# Patient Record
Sex: Female | Born: 1970
Health system: Southern US, Community
[De-identification: ages and names within clinical notes are randomized; demographics above are authoritative.]

## PROBLEM LIST (undated history)

## (undated) DIAGNOSIS — R112 Nausea with vomiting, unspecified: Secondary | ICD-10-CM

## (undated) DIAGNOSIS — I10 Essential (primary) hypertension: Secondary | ICD-10-CM

## (undated) DIAGNOSIS — G4733 Obstructive sleep apnea (adult) (pediatric): Secondary | ICD-10-CM

## (undated) DIAGNOSIS — D259 Leiomyoma of uterus, unspecified: Secondary | ICD-10-CM

## (undated) DIAGNOSIS — Z9889 Other specified postprocedural states: Secondary | ICD-10-CM

## (undated) DIAGNOSIS — R7303 Prediabetes: Secondary | ICD-10-CM

## (undated) DIAGNOSIS — T7840XA Allergy, unspecified, initial encounter: Secondary | ICD-10-CM

## (undated) DIAGNOSIS — G4730 Sleep apnea, unspecified: Secondary | ICD-10-CM

## (undated) DIAGNOSIS — M1990 Unspecified osteoarthritis, unspecified site: Secondary | ICD-10-CM

## (undated) DIAGNOSIS — K219 Gastro-esophageal reflux disease without esophagitis: Secondary | ICD-10-CM

## (undated) DIAGNOSIS — R739 Hyperglycemia, unspecified: Secondary | ICD-10-CM

## (undated) HISTORY — DX: Gastro-esophageal reflux disease without esophagitis: K21.9

## (undated) HISTORY — DX: Leiomyoma of uterus, unspecified: D25.9

## (undated) HISTORY — DX: Obstructive sleep apnea (adult) (pediatric): G47.33

## (undated) HISTORY — DX: Hyperglycemia, unspecified: R73.9

## (undated) HISTORY — PX: BUNIONECTOMY: SHX129

## (undated) HISTORY — DX: Allergy, unspecified, initial encounter: T78.40XA

## (undated) HISTORY — PX: CHOLECYSTECTOMY: SHX55

## (undated) HISTORY — PX: CYST REMOVAL HAND: SHX6279

## (undated) HISTORY — DX: Sleep apnea, unspecified: G47.30

---
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
## 2011-05-05 ENCOUNTER — Emergency Department (HOSPITAL_BASED_OUTPATIENT_CLINIC_OR_DEPARTMENT_OTHER)
Admission: EM | Admit: 2011-05-05 | Discharge: 2011-05-05 | Disposition: A | Discharge status: Home / Self Care | Payer: 59

## 2011-05-05 NOTE — ED Notes (Signed)
Left prior to triage 

## 2012-11-03 ENCOUNTER — Emergency Department (HOSPITAL_BASED_OUTPATIENT_CLINIC_OR_DEPARTMENT_OTHER): Payer: 59

## 2012-11-03 ENCOUNTER — Encounter (HOSPITAL_BASED_OUTPATIENT_CLINIC_OR_DEPARTMENT_OTHER): Payer: Self-pay | Admitting: *Deleted

## 2012-11-03 ENCOUNTER — Emergency Department (HOSPITAL_BASED_OUTPATIENT_CLINIC_OR_DEPARTMENT_OTHER)
Admission: EM | Admit: 2012-11-03 | Discharge: 2012-11-03 | Disposition: A | Discharge status: Home / Self Care | Payer: 59 | Attending: Emergency Medicine | Admitting: Emergency Medicine

## 2012-11-03 DIAGNOSIS — M76899 Other specified enthesopathies of unspecified lower limb, excluding foot: Secondary | ICD-10-CM | POA: Insufficient documentation

## 2012-11-03 DIAGNOSIS — M719 Bursopathy, unspecified: Secondary | ICD-10-CM

## 2012-11-03 MED ORDER — HYDROCODONE-ACETAMINOPHEN 5-325 MG PO TABS: 2.0000 | ORAL_TABLET | ORAL | Status: DC | PRN

## 2012-11-03 MED ORDER — PREDNISONE 10 MG PO TABS: ORAL_TABLET | ORAL | Status: DC

## 2012-11-03 NOTE — ED Notes (Signed)
Pt returned from xray

## 2012-11-03 NOTE — ED Notes (Signed)
Pt c/o right hip pain w/o injury x 2 weeks

## 2012-11-03 NOTE — ED Provider Notes (Signed)
Medical screening examination/treatment/procedure(s) were performed by non-physician practitioner and as supervising physician I was immediately available for consultation/collaboration.  Gerhard Munch, MD 11/03/12 [phone]

## 2012-11-03 NOTE — ED Provider Notes (Signed)
History     CSN: [id number]  Arrival date & time 11/03/12  1931   First MD Initiated Contact with Patient 11/03/12 2133      Chief Complaint  Patient presents with  . Hip Pain    (Consider location/radiation/quality/duration/timing/severity/associated sxs/prior treatment) HPI Comments: Pt is c/o right hip pain times 2 weeks:pt states that she knows of no definite injury but she is very active:pt states that it hurts to move on lay on her right hip:no fever:no numbness or weakness:pt denies swelling, redness or warmth  The history is provided by the patient. No language interpreter was used.    History reviewed. No pertinent past medical history.  Past Surgical History  Procedure Date  . Cholecystectomy     History reviewed. No pertinent family history.  History  Substance Use Topics  . Smoking status: Never Smoker   . Smokeless tobacco: Not on file  . Alcohol Use: No    OB History    Grav Para Term Preterm Abortions TAB SAB Ect Mult Living                  Review of Systems  Constitutional: Negative.   Respiratory: Negative.   Cardiovascular: Negative.     Allergies  Review of patient's allergies indicates no known allergies.  Home Medications  No current outpatient prescriptions on file.  BP 150/93  Pulse 84  Temp 98.6 F (37 C) (Oral)  Resp 16  Ht 5\' 5"  (1.651 m)  Wt 200 lb (90.719 kg)  BMI 33.28 kg/m2  SpO2 100%  LMP 10/28/2012  Physical Exam  Nursing note and vitals reviewed. Constitutional: She is oriented to person, place, and time. She appears well-developed and well-nourished.  Cardiovascular: Normal rate and regular rhythm.   Pulmonary/Chest: Effort normal and breath sounds normal.  Musculoskeletal: Normal range of motion.       Pt tender on the right lateral hip:pt has full rom  Neurological: She is alert and oriented to person, place, and time.  Skin: Skin is warm and dry.    ED Course  Procedures (including critical care  time)  Labs Reviewed - No data to display Dg Hip Complete Right  11/03/2012  *RADIOLOGY REPORT*  Clinical Data: Right hip pain for 1 month.  RIGHT HIP - COMPLETE 2+ VIEW  Comparison: None.  Findings: Imaged bones, joints and soft tissues appear normal.  IMPRESSION: Negative exam.   Original Report Authenticated By: Holley Dexter, M.D.      1. Bursitis       MDM  Exam consistent with bursitis:will treat symptomatically and refer to Dr Pearletha Forge for follow up        Teressa Lower, NP 11/03/12 2214

## 2012-11-03 NOTE — ED Notes (Signed)
Patient transported to X-ray 

## 2012-11-04 ENCOUNTER — Ambulatory Visit (INDEPENDENT_AMBULATORY_CARE_PROVIDER_SITE_OTHER): Payer: 59 | Admitting: Family Medicine

## 2012-11-04 ENCOUNTER — Encounter: Payer: Self-pay | Admitting: Family Medicine

## 2012-11-04 VITALS — BP 125/86 | HR 81 | Ht 65.0 in | Wt 205.0 lb

## 2012-11-04 DIAGNOSIS — M25551 Pain in right hip: Secondary | ICD-10-CM

## 2012-11-04 DIAGNOSIS — M25559 Pain in unspecified hip: Secondary | ICD-10-CM

## 2012-11-04 NOTE — Patient Instructions (Addendum)
Your primary issue is IT band syndrome with associated trochanteric bursitis, hip flexor spasms. Avoid painful activities as much as possible. Ice over area of pain 3-4 times a day for 15 minutes at a time Start physical therapy for stretches and strengthening exercises. Do home exercises and stretches on days you do not go to physical therapy. Finish the prednisone. After you finish prednisone start aleve 2 tabs twice a day with food for pain and inflammation. If not improving over the next 1-2 weeks and you want to go ahead with an injection just call us and we will do that. Otherwise follow up with me in 6 weeks.

## 2012-11-05 ENCOUNTER — Encounter: Payer: Self-pay | Admitting: Family Medicine

## 2012-11-05 DIAGNOSIS — M25551 Pain in right hip: Secondary | ICD-10-CM | POA: Insufficient documentation

## 2012-11-05 NOTE — Progress Notes (Signed)
  Subjective:    Patient ID: Denise Austin, female    DOB: 07/26/1971, 42 y.o.   MRN: [id number]  PCP: [facility]  HPI 42 yo F here for right hip pain.  Patient denies known injury and does not exercise regularly. States over past couple months has developed pain mostly lateral right hip radiating down to knee. Worse when on feet for a long time or if sitting for a long time then goes to get up. Right hip radiographs were normal. Takes ibuprofen, sometimes aleve instead which has eased pain but really returned on Sunday. No back pain. Once or twice had numbness in this area. No prior issues with this hip.  History reviewed. No pertinent past medical history.  Current Outpatient Prescriptions on File Prior to Visit  Medication Sig Dispense Refill  . HYDROcodone-acetaminophen (NORCO/VICODIN) 5-325 MG per tablet Take 2 tablets by mouth every 4 (four) hours as needed for pain.  10 tablet  0  . predniSONE (DELTASONE) 10 MG tablet 6 day step down  21 tablet  0    Past Surgical History  Procedure Date  . Cholecystectomy   . Bunionectomy     No Known Allergies  History   Social History  . Marital Status: Single    Spouse Name: N/A    Number of Children: N/A  . Years of Education: N/A   Occupational History  . Not on file.   Social History Main Topics  . Smoking status: Never Smoker   . Smokeless tobacco: Not on file  . Alcohol Use: No  . Drug Use: No  . Sexually Active: No   Other Topics Concern  . Not on file   Social History Narrative  . No narrative on file    Family History  Problem Relation Age of Onset  . Hypertension Mother   . Diabetes Mother   . Hypertension Father   . Heart attack Neg Hx   . Hyperlipidemia Neg Hx   . Sudden death Neg Hx     BP 125/86  Pulse 81  Ht 5\' 5"  (1.651 m)  Wt 205 lb (92.987 kg)  BMI 34.11 kg/m2  LMP 10/28/2012 Review of Systems See HPI above.    Objective:   Physical Exam Gen:  NAD  Back/R hip: No gross deformity, scoliosis. TTP proximal 1/2 ITB including trochanteric bursa.  Less TTP anterior thigh proximally.  No midline or bony TTP.  No back TTP. FROM without pain. Strength LEs 5/5 all muscle groups except 4+/5 hip abduction.   2+ MSRs in patellar and achilles tendons, equal bilaterally. Negative SLRs. Sensation intact to light touch bilaterally. Negative logroll bilateral hips Negative fabers and piriformis stretches.    Assessment & Plan:  1. Right hip pain - Consistent with IT band syndrome, bursitis, with hip flexor spasms.  No back pain, radiculopathic symptoms to suggest pinched nerve.  Start with PT, home exercises, continue prednisone.  Declined trochanteric bursa injection today (she has more pain than just in this area).  Icing, avoid painful activities.  F/u in 6 weeks for reevaluation.

## 2012-11-05 NOTE — Assessment & Plan Note (Signed)
Consistent with IT band syndrome, bursitis, with hip flexor spasms.  No back pain, radiculopathic symptoms to suggest pinched nerve.  Start with PT, home exercises, continue prednisone.  Declined trochanteric bursa injection today (she has more pain than just in this area).  Icing, avoid painful activities.  F/u in 6 weeks for reevaluation.

## 2012-11-08 ENCOUNTER — Encounter: Payer: 59 | Admitting: Physical Therapy

## 2012-11-08 ENCOUNTER — Ambulatory Visit: Payer: 59 | Admitting: Physical Therapy

## 2012-11-25 ENCOUNTER — Encounter (HOSPITAL_BASED_OUTPATIENT_CLINIC_OR_DEPARTMENT_OTHER): Payer: Self-pay

## 2012-11-25 ENCOUNTER — Emergency Department (HOSPITAL_BASED_OUTPATIENT_CLINIC_OR_DEPARTMENT_OTHER)
Admission: EM | Admit: 2012-11-25 | Discharge: 2012-11-25 | Disposition: A | Discharge status: Home / Self Care | Payer: 59 | Attending: Emergency Medicine | Admitting: Emergency Medicine

## 2012-11-25 DIAGNOSIS — L0291 Cutaneous abscess, unspecified: Secondary | ICD-10-CM

## 2012-11-25 DIAGNOSIS — Y939 Activity, unspecified: Secondary | ICD-10-CM | POA: Insufficient documentation

## 2012-11-25 DIAGNOSIS — X58XXXA Exposure to other specified factors, initial encounter: Secondary | ICD-10-CM | POA: Insufficient documentation

## 2012-11-25 DIAGNOSIS — L02519 Cutaneous abscess of unspecified hand: Secondary | ICD-10-CM | POA: Insufficient documentation

## 2012-11-25 DIAGNOSIS — L03019 Cellulitis of unspecified finger: Secondary | ICD-10-CM | POA: Insufficient documentation

## 2012-11-25 DIAGNOSIS — Y929 Unspecified place or not applicable: Secondary | ICD-10-CM | POA: Insufficient documentation

## 2012-11-25 DIAGNOSIS — L089 Local infection of the skin and subcutaneous tissue, unspecified: Secondary | ICD-10-CM | POA: Insufficient documentation

## 2012-11-25 DIAGNOSIS — T148XXA Other injury of unspecified body region, initial encounter: Secondary | ICD-10-CM

## 2012-11-25 MED ORDER — LIDOCAINE HCL (PF) 1 % IJ SOLN
5.0000 mL | Freq: Once | INTRAMUSCULAR | Status: AC
  Administered 2012-11-25: 5 mL
  Filled 2012-11-25: qty 5

## 2012-11-25 NOTE — ED Notes (Signed)
Pt reports 3rd digit on right hand has been painful and swollen x 2 weeks.

## 2012-11-25 NOTE — ED Provider Notes (Signed)
History     CSN: [id number]  Arrival date & time 11/25/12  1701   First MD Initiated Contact with Patient 11/25/12 1716      Chief Complaint  Patient presents with  . Hand Pain    (Consider location/radiation/quality/duration/timing/severity/associated sxs/prior treatment) HPI Comments: Patient comes to the ER for evaluation of pain and swelling of the right middle finger. Patient reports that she get a splinter in the area just before the pain and swelling started. She thought she pulled a splinter entirely out. Over the next 2 weeks, however, she has been noticing increased pain in the area. The area is swollen and tender to the touch. Pain has become moderate to severe. She has not had any drainage.  Patient is a 42 y.o. female presenting with hand pain.  Hand Pain    History reviewed. No pertinent past medical history.  Past Surgical History  Procedure Date  . Cholecystectomy   . Bunionectomy     Family History  Problem Relation Age of Onset  . Hypertension Mother   . Diabetes Mother   . Hypertension Father   . Heart attack Neg Hx   . Hyperlipidemia Neg Hx   . Sudden death Neg Hx     History  Substance Use Topics  . Smoking status: Never Smoker   . Smokeless tobacco: Not on file  . Alcohol Use: No    OB History    Grav Para Term Preterm Abortions TAB SAB Ect Mult Living                  Review of Systems  Skin: Positive for wound.    Allergies  Review of patient's allergies indicates no known allergies.  Home Medications  No current outpatient prescriptions on file.  BP 143/88  Pulse 71  Temp 98.2 F (36.8 C) (Oral)  Resp 18  Ht 5\' 5"  (1.651 m)  Wt 200 lb (90.719 kg)  BMI 33.28 kg/m2  SpO2 100%  LMP 10/28/2012  Physical Exam  Musculoskeletal: Normal range of motion. She exhibits no edema.       Right hand: She exhibits tenderness. She exhibits normal range of motion and no deformity. normal sensation noted. Normal strength noted.   Hands:      Small pustule formation over the middle phalanx of the right middle finger on the volar side. No induration or surrounding erythema. Area is tender to the touch.    ED Course  Procedures (including critical care time)  INCISION AND DRAINAGE Performed by: Gilda Crease. Consent: Verbal consent obtained. Risks and benefits: risks, benefits and alternatives were discussed Type: abscess  Body area: Right middle finger  Anesthesia: local infiltration  Incision was made with a scalpel.  Local anesthetic: lidocaine 1% without epinephrine  Anesthetic total: 1 ml  Complexity: complex Blunt dissection to break up loculations  Drainage: purulent  Drainage amount: small  4 mm wooden splinter removed from the abscess cavity   Packing material: 1/4 in iodoform gauze  Patient tolerance: Patient tolerated the procedure well with no immediate complications.     Labs Reviewed - No data to display No results found.   Diagnosis: 1. Skin Abscess 2. Splinter    MDM  Patient is a small area of tenderness and supine on the volar aspect of the right middle finger where she had a splinter. She pulled the splinter out herself, thought that she got total splinter, but has had increasing pain and swelling since. I recommended digital block  with incision and drainage of the area to the patient. Patient did give verbal consent.  Incision and drainage revealed a small amount of pus, but there was a large piece of splinter still retained in the cavity. This was removed in entirety. There is no erythema or induration. This appeared to be a sterile abscess secondary to retained foreign body. Patient will use Neosporin and topical wound care.      Gilda Crease, MD 11/25/12 [phone]

## 2014-07-06 DIAGNOSIS — M659 Synovitis and tenosynovitis, unspecified: Secondary | ICD-10-CM | POA: Insufficient documentation

## 2014-07-06 DIAGNOSIS — M19049 Primary osteoarthritis, unspecified hand: Secondary | ICD-10-CM | POA: Insufficient documentation

## 2014-07-06 DIAGNOSIS — Z9889 Other specified postprocedural states: Secondary | ICD-10-CM | POA: Insufficient documentation

## 2014-08-30 ENCOUNTER — Emergency Department (HOSPITAL_BASED_OUTPATIENT_CLINIC_OR_DEPARTMENT_OTHER)
Admission: EM | Admit: 2014-08-30 | Discharge: 2014-08-30 | Discharge status: Against Medical Advice | Payer: 59 | Attending: Emergency Medicine | Admitting: Emergency Medicine

## 2014-08-30 ENCOUNTER — Encounter (HOSPITAL_BASED_OUTPATIENT_CLINIC_OR_DEPARTMENT_OTHER): Payer: Self-pay | Admitting: Emergency Medicine

## 2014-08-30 DIAGNOSIS — Y929 Unspecified place or not applicable: Secondary | ICD-10-CM | POA: Insufficient documentation

## 2014-08-30 DIAGNOSIS — R51 Headache: Secondary | ICD-10-CM | POA: Diagnosis present

## 2014-08-30 DIAGNOSIS — T7840XA Allergy, unspecified, initial encounter: Secondary | ICD-10-CM | POA: Diagnosis not present

## 2014-08-30 DIAGNOSIS — Y939 Activity, unspecified: Secondary | ICD-10-CM | POA: Insufficient documentation

## 2014-08-30 DIAGNOSIS — X58XXXA Exposure to other specified factors, initial encounter: Secondary | ICD-10-CM | POA: Diagnosis not present

## 2014-08-30 NOTE — ED Provider Notes (Signed)
Patient left without being seen.   Wandra Arthurs, MD 08/30/14 [phone]

## 2014-08-30 NOTE — ED Notes (Signed)
Pt not in ED WR when called for tx area 

## 2014-08-30 NOTE — ED Notes (Signed)
Took a 43 yo with and allergic reaction that had recently come in and she walked out

## 2014-08-30 NOTE — ED Notes (Signed)
States has a "cold"  Aches  And headache-

## 2014-08-30 NOTE — ED Notes (Signed)
Trying to get triage info.  Pt hostile and very sharpe. Hesitant about answering more questions.  States she has noticed others going back. Most going back for triage and coming back to lobby.

## 2014-08-31 ENCOUNTER — Encounter (HOSPITAL_BASED_OUTPATIENT_CLINIC_OR_DEPARTMENT_OTHER): Payer: Self-pay

## 2014-08-31 ENCOUNTER — Emergency Department (HOSPITAL_BASED_OUTPATIENT_CLINIC_OR_DEPARTMENT_OTHER)
Admission: EM | Admit: 2014-08-31 | Discharge: 2014-08-31 | Disposition: A | Discharge status: Home / Self Care | Payer: BC Managed Care – PPO | Attending: Emergency Medicine | Admitting: Emergency Medicine

## 2014-08-31 DIAGNOSIS — R0981 Nasal congestion: Secondary | ICD-10-CM | POA: Diagnosis present

## 2014-08-31 DIAGNOSIS — J069 Acute upper respiratory infection, unspecified: Secondary | ICD-10-CM

## 2014-08-31 LAB — RAPID STREP SCREEN (MED CTR MEBANE ONLY): Streptococcus, Group A Screen (Direct): NEGATIVE

## 2014-08-31 MED ORDER — LORATADINE 10 MG PO TABS: 10.0000 mg | ORAL_TABLET | Freq: Every day | ORAL | Status: DC

## 2014-08-31 MED ORDER — FLUTICASONE PROPIONATE 50 MCG/ACT NA SUSP: 2.0000 | Freq: Every day | NASAL | Status: DC

## 2014-08-31 MED ORDER — DEXAMETHASONE 4 MG PO TABS
10.0000 mg | ORAL_TABLET | Freq: Once | ORAL | Status: AC
  Administered 2014-08-31: 10 mg via ORAL

## 2014-08-31 MED ORDER — DEXAMETHASONE 4 MG PO TABS
ORAL_TABLET | ORAL | Status: AC
  Filled 2014-08-31: qty 3

## 2014-08-31 NOTE — ED Provider Notes (Signed)
CSN: [id number]     Arrival date & time 08/31/14  0745 History   None    Chief Complaint  Patient presents with  . Nasal Congestion     (Consider location/radiation/quality/duration/timing/severity/associated sxs/prior Treatment) Patient is a 43 y.o. female presenting with URI. The history is provided by the patient.  URI Presenting symptoms: congestion and sore throat   Presenting symptoms: no cough, no fatigue and no fever   Congestion:    Location:  Nasal Severity:  Mild Onset quality:  Gradual Duration:  3 days Timing:  Constant Progression:  Unchanged Chronicity:  New Relieved by:  Nothing Worsened by:  Nothing tried Ineffective treatments:  None tried Associated symptoms: headaches (sinus)   Associated symptoms: no neck pain     History reviewed. No pertinent past medical history. Past Surgical History  Procedure Laterality Date  . Cholecystectomy    . Bunionectomy     Family History  Problem Relation Age of Onset  . Hypertension Mother   . Diabetes Mother   . Hypertension Father   . Heart attack Neg Hx   . Hyperlipidemia Neg Hx   . Sudden death Neg Hx    History  Substance Use Topics  . Smoking status: Never Smoker   . Smokeless tobacco: Not on file  . Alcohol Use: No   OB History    No data available     Review of Systems  Constitutional: Negative for fever and fatigue.  HENT: Positive for congestion and sore throat. Negative for drooling.   Eyes: Negative for pain.  Respiratory: Negative for cough and shortness of breath.   Cardiovascular: Negative for chest pain.  Gastrointestinal: Negative for nausea, vomiting, abdominal pain and diarrhea.  Genitourinary: Negative for dysuria and hematuria.  Musculoskeletal: Negative for back pain, gait problem and neck pain.  Skin: Negative for color change.  Neurological: Positive for headaches (sinus). Negative for dizziness.  Hematological: Negative for adenopathy.  Psychiatric/Behavioral: Negative for  behavioral problems.  All other systems reviewed and are negative.     Allergies  Review of patient's allergies indicates no known allergies.  Home Medications   Prior to Admission medications   Not on File   BP 147/95 mmHg  Pulse 99  Temp(Src) 99.4 F (37.4 C) (Oral)  Resp 16  SpO2 99%  LMP 08/12/2014 Physical Exam  Constitutional: She is oriented to person, place, and time. She appears well-developed and well-nourished.  HENT:  Head: Normocephalic and atraumatic.  Mouth/Throat: Oropharynx is clear and moist. No oropharyngeal exudate.  Mildly prominent tender anterior cervical adenopathy.   Eyes: Conjunctivae and EOM are normal. Pupils are equal, round, and reactive to light.  Neck: Normal range of motion. Neck supple.  Cardiovascular: Normal rate, regular rhythm, normal heart sounds and intact distal pulses.  Exam reveals no gallop and no friction rub.   No murmur heard. Pulmonary/Chest: Effort normal and breath sounds normal. No respiratory distress. She has no wheezes.  Abdominal: Soft. Bowel sounds are normal. There is no tenderness. There is no rebound and no guarding.  Musculoskeletal: Normal range of motion. She exhibits no edema or tenderness.  Neurological: She is alert and oriented to person, place, and time.  Skin: Skin is warm and dry.  Psychiatric: She has a normal mood and affect. Her behavior is normal.  Nursing note and vitals reviewed.   ED Course  Procedures (including critical care time) Labs Review Labs Reviewed  RAPID STREP SCREEN  CULTURE, GROUP A STREP    Imaging Review  No results found.   EKG Interpretation None      MDM   Final diagnoses:  Viral URI    7:58 AM 43 y.o. female here w/ nasal cong, sore throat for 3-4 days. No fevers. AFVSS here. Will perform strep screen (Centor 2) and give a dose of decadron. Likely viral URI, will rec sx therapy.     Pamella Pert, MD 08/31/14 [phone]

## 2014-08-31 NOTE — Discharge Instructions (Signed)
Upper Respiratory Infection, Adult An upper respiratory infection (URI) is also sometimes known as the common cold. The upper respiratory tract includes the nose, sinuses, throat, trachea, and bronchi. Bronchi are the airways leading to the lungs. Most people improve within 1 week, but symptoms can last up to 2 weeks. A residual cough may last even longer.  CAUSES Many different viruses can infect the tissues lining the upper respiratory tract. The tissues become irritated and inflamed and often become very moist. Mucus production is also common. A cold is contagious. You can easily spread the virus to others by oral contact. This includes kissing, sharing a glass, coughing, or sneezing. Touching your mouth or nose and then touching a surface, which is then touched by another person, can also spread the virus. SYMPTOMS  Symptoms typically develop 1 to 3 days after you come in contact with a cold virus. Symptoms vary from person to person. They may include:  Runny nose.  Sneezing.  Nasal congestion.  Sinus irritation.  Sore throat.  Loss of voice (laryngitis).  Cough.  Fatigue.  Muscle aches.  Loss of appetite.  Headache.  Low-grade fever. DIAGNOSIS  You might diagnose your own cold based on familiar symptoms, since most people get a cold 2 to 3 times a year. Your caregiver can confirm this based on your exam. Most importantly, your caregiver can check that your symptoms are not due to another disease such as strep throat, sinusitis, pneumonia, asthma, or epiglottitis. Blood tests, throat tests, and X-rays are not necessary to diagnose a common cold, but they may sometimes be helpful in excluding other more serious diseases. Your caregiver will decide if any further tests are required. RISKS AND COMPLICATIONS  You may be at risk for a more severe case of the common cold if you smoke cigarettes, have chronic heart disease (such as heart failure) or lung disease (such as asthma), or if  you have a weakened immune system. The very young and very old are also at risk for more serious infections. Bacterial sinusitis, middle ear infections, and bacterial pneumonia can complicate the common cold. The common cold can worsen asthma and chronic obstructive pulmonary disease (COPD). Sometimes, these complications can require emergency medical care and may be life-threatening. PREVENTION  The best way to protect against getting a cold is to practice good hygiene. Avoid oral or hand contact with people with cold symptoms. Wash your hands often if contact occurs. There is no clear evidence that vitamin C, vitamin E, echinacea, or exercise reduces the chance of developing a cold. However, it is always recommended to get plenty of rest and practice good nutrition. TREATMENT  Treatment is directed at relieving symptoms. There is no cure. Antibiotics are not effective, because the infection is caused by a virus, not by bacteria. Treatment may include:  Increased fluid intake. Sports drinks offer valuable electrolytes, sugars, and fluids.  Breathing heated mist or steam (vaporizer or shower).  Eating chicken soup or other clear broths, and maintaining good nutrition.  Getting plenty of rest.  Using gargles or lozenges for comfort.  Controlling fevers with ibuprofen or acetaminophen as directed by your caregiver.  Increasing usage of your inhaler if you have asthma. Zinc gel and zinc lozenges, taken in the first 24 hours of the common cold, can shorten the duration and lessen the severity of symptoms. Pain medicines may help with fever, muscle aches, and throat pain. A variety of non-prescription medicines are available to treat congestion and runny nose. Your caregiver   can make recommendations and may suggest nasal or lung inhalers for other symptoms.  HOME CARE INSTRUCTIONS   Only take over-the-counter or prescription medicines for pain, discomfort, or fever as directed by your  caregiver.  Use a warm mist humidifier or inhale steam from a shower to increase air moisture. This may keep secretions moist and make it easier to breathe.  Drink enough water and fluids to keep your urine clear or pale yellow.  Rest as needed.  Return to work when your temperature has returned to normal or as your caregiver advises. You may need to stay home longer to avoid infecting others. You can also use a face mask and careful hand washing to prevent spread of the virus. SEEK MEDICAL CARE IF:   After the first few days, you feel you are getting worse rather than better.  You need your caregiver's advice about medicines to control symptoms.  You develop chills, worsening shortness of breath, or brown or red sputum. These may be signs of pneumonia.  You develop yellow or brown nasal discharge or pain in the face, especially when you bend forward. These may be signs of sinusitis.  You develop a fever, swollen neck glands, pain with swallowing, or white areas in the back of your throat. These may be signs of strep throat. SEEK IMMEDIATE MEDICAL CARE IF:   You have a fever.  You develop severe or persistent headache, ear pain, sinus pain, or chest pain.  You develop wheezing, a prolonged cough, cough up blood, or have a change in your usual mucus (if you have chronic lung disease).  You develop sore muscles or a stiff neck. Document Released: 04/08/2001 Document Revised: 01/05/2012 Document Reviewed: 01/18/2014 [employer] Patient Information 2015 [employer]. This information is not intended to replace advice given to you by your health care provider. Make sure you discuss any questions you have with your health care provider.  

## 2014-08-31 NOTE — ED Notes (Signed)
Reports nasal congestion and sorethroat since Monday.

## 2014-09-02 LAB — CULTURE, GROUP A STREP

## 2014-10-16 ENCOUNTER — Encounter: Payer: Self-pay | Admitting: Family

## 2014-10-16 ENCOUNTER — Ambulatory Visit (INDEPENDENT_AMBULATORY_CARE_PROVIDER_SITE_OTHER): Payer: BC Managed Care – PPO | Admitting: Family

## 2014-10-16 VITALS — BP 118/82 | HR 76 | Temp 97.9°F | Resp 16 | Ht 66.0 in | Wt 210.8 lb

## 2014-10-16 DIAGNOSIS — Z0000 Encounter for general adult medical examination without abnormal findings: Secondary | ICD-10-CM

## 2014-10-16 LAB — LIPID PANEL
Cholesterol: 104 mg/dL (ref 0–200)
HDL: 34.9 mg/dL — ABNORMAL LOW (ref >39.00)
LDL Cholesterol: 60 mg/dL (ref 0–99)
NonHDL: 69.1
Total CHOL/HDL Ratio: 3
Triglycerides: 47 mg/dL (ref 0.0–149.0)
VLDL: 9.4 mg/dL (ref 0.0–40.0)

## 2014-10-16 LAB — CBC WITH DIFFERENTIAL/PLATELET
Basophils Absolute: 0 10*3/uL (ref 0.0–0.1)
Basophils Relative: 0.6 % (ref 0.0–3.0)
Eosinophils Absolute: 0.1 10*3/uL (ref 0.0–0.7)
Eosinophils Relative: 1.7 % (ref 0.0–5.0)
HCT: 38 % (ref 36.0–46.0)
Hemoglobin: 12.4 g/dL (ref 12.0–15.0)
Lymphocytes Relative: 28.3 % (ref 12.0–46.0)
Lymphs Abs: 1.8 10*3/uL (ref 0.7–4.0)
MCHC: 32.7 g/dL (ref 30.0–36.0)
MCV: 92.9 fl (ref 78.0–100.0)
Monocytes Absolute: 0.4 10*3/uL (ref 0.1–1.0)
Monocytes Relative: 5.7 % (ref 3.0–12.0)
Neutro Abs: 4 10*3/uL (ref 1.4–7.7)
Neutrophils Relative %: 63.7 % (ref 43.0–77.0)
Platelets: 284 10*3/uL (ref 150.0–400.0)
RBC: 4.09 Mil/uL (ref 3.87–5.11)
RDW: 13.8 % (ref 11.5–15.5)
WBC: 6.2 10*3/uL (ref 4.0–10.5)

## 2014-10-16 LAB — HEPATIC FUNCTION PANEL
ALT: 16 U/L (ref 0–35)
AST: 19 U/L (ref 0–37)
Albumin: 3.7 g/dL (ref 3.5–5.2)
Alkaline Phosphatase: 60 U/L (ref 39–117)
Bilirubin, Direct: 0.1 mg/dL (ref 0.0–0.3)
Total Bilirubin: 0.7 mg/dL (ref 0.2–1.2)
Total Protein: 6.8 g/dL (ref 6.0–8.3)

## 2014-10-16 LAB — BASIC METABOLIC PANEL
BUN: 13 mg/dL (ref 6–23)
CO2: 23 mEq/L (ref 19–32)
Calcium: 8.9 mg/dL (ref 8.4–10.5)
Chloride: 107 mEq/L (ref 96–112)
Creatinine, Ser: 0.8 mg/dL (ref 0.4–1.2)
GFR: 106.23 mL/min (ref >60.00)
Glucose, Bld: 113 mg/dL — ABNORMAL HIGH (ref 70–99)
Potassium: 4.1 mEq/L (ref 3.5–5.1)
Sodium: 137 mEq/L (ref 135–145)

## 2014-10-16 LAB — URINALYSIS, ROUTINE W REFLEX MICROSCOPIC
Bilirubin Urine: NEGATIVE
Ketones, ur: NEGATIVE
Leukocytes, UA: NEGATIVE
Nitrite: NEGATIVE
Specific Gravity, Urine: 1.025 (ref 1.000–1.030)
Total Protein, Urine: NEGATIVE
Urine Glucose: NEGATIVE
Urobilinogen, UA: 1 (ref 0.0–1.0)
pH: 6 (ref 5.0–8.0)

## 2014-10-16 LAB — HIV ANTIBODY (ROUTINE TESTING W REFLEX): HIV 1&2 Ab, 4th Generation: NONREACTIVE

## 2014-10-16 NOTE — Progress Notes (Signed)
Pre visit review using our clinic review tool, if applicable. No additional management support is needed unless otherwise documented below in the visit note. 

## 2014-10-16 NOTE — Patient Instructions (Signed)
Please complete lab work prior to leaving. Try counting calories to help you with weight loss using My Fitness pal. Follow up in 1 year, sooner if problems/concerns.  Food Choices for Gastroesophageal Reflux Disease When you have gastroesophageal reflux disease (GERD), the foods you eat and your eating habits are very important. Choosing the right foods can help ease the discomfort of GERD. WHAT GENERAL GUIDELINES DO I NEED TO FOLLOW?  Choose fruits, vegetables, whole grains, low-fat dairy products, and low-fat meat, fish, and poultry.  Limit fats such as oils, salad dressings, butter, nuts, and avocado.  Keep a food diary to identify foods that cause symptoms.  Avoid foods that cause reflux. These may be different for different people.  Eat frequent small meals instead of three large meals each day.  Eat your meals slowly, in a relaxed setting.  Limit fried foods.  Cook foods using methods other than frying.  Avoid drinking alcohol.  Avoid drinking large amounts of liquids with your meals.  Avoid bending over or lying down until 2-3 hours after eating. WHAT FOODS ARE NOT RECOMMENDED? The following are some foods and drinks that may worsen your symptoms: Vegetables Tomatoes. Tomato juice. Tomato and spaghetti sauce. Chili peppers. Onion and garlic. Horseradish. Fruits Oranges, grapefruit, and lemon (fruit and juice). Meats High-fat meats, fish, and poultry. This includes hot dogs, ribs, ham, sausage, salami, and bacon. Dairy Whole milk and chocolate milk. Sour cream. Cream. Butter. Ice cream. Cream cheese.  Beverages Coffee and tea, with or without caffeine. Carbonated beverages or energy drinks. Condiments Hot sauce. Barbecue sauce.  Sweets/Desserts Chocolate and cocoa. Donuts. Peppermint and spearmint. Fats and Oils High-fat foods, including [place] fries and potato chips. Other Vinegar. Strong spices, such as black pepper, white pepper, red pepper, cayenne, curry  powder, cloves, ginger, and chili powder. The items listed above may not be a complete list of foods and beverages to avoid. Contact your dietitian for more information. Document Released: 10/13/2005 Document Revised: 10/18/2013 Document Reviewed: 08/17/2013 [facility] Patient Information 2015 [place], [place]. This information is not intended to replace advice given to you by your health care provider. Make sure you discuss any questions you have with your health care provider.

## 2014-10-16 NOTE — Progress Notes (Signed)
Subjective:    Patient ID: Denise Austin, female    DOB: May 14, 1971, 43 y.o.   MRN: [id number]  HPI  Patient presents today for complete physical.  Immunizations: last tetanus, 09/23/14 Diet: reports diet is not healthy.  Needs to cut back on bread/soft drinks.  Reports that her weight was 180-190 18 mos ago. Exercise:walking in the afternoons Colonoscopy: never Pap Smear: 10/2013-normal,  Mammogram: due- [facility]. Pt will schedule [facility]- due Dental- up to date    Review of Systems  Constitutional: Positive for unexpected weight change.  HENT: Negative for hearing loss and rhinorrhea.   Eyes: Negative for visual disturbance.  Respiratory: Negative for cough.   Cardiovascular: Negative for leg swelling.  Gastrointestinal: Negative for nausea, vomiting, diarrhea and blood in stool.       Occasional constipation  Genitourinary: Negative for dysuria, frequency and hematuria.  Musculoskeletal: Negative for arthralgias.       Has had some mild sciatic symptoms in the past  Skin: Negative for rash.  Neurological: Negative for headaches.  Hematological: Negative for adenopathy.  Psychiatric/Behavioral:       Denies depression/anxiety       Past Medical History  Diagnosis Date  . GERD (gastroesophageal reflux disease)   . Uterine fibroid     History   Social History  . Marital Status: Single    Spouse Name: N/A    Number of Children: N/A  . Years of Education: N/A   Occupational History  . Not on file.   Social History Main Topics  . Smoking status: Never Smoker   . Smokeless tobacco: Not on file  . Alcohol Use: 0.0 oz/week    0 Not specified per week     Comment: occasional  . Drug Use: No  . Sexual Activity: No   Other Topics Concern  . 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 Cholecystectomy    . Bunionectomy      Family History  Problem Relation Age of Onset  . Hypertension Mother   . Diabetes Mother   . Hypertension Father   . COPD Father   . Heart attack Neg Hx   . Hyperlipidemia Neg Hx   . Sudden death Neg Hx     Allergies  Allergen Reactions  . Oxycodone-Acetaminophen Nausea And Vomiting and Rash    No current outpatient prescriptions on file prior to visit.   No current facility-administered medications on file prior to visit.    BP 118/82 mmHg  Pulse 76  Temp(Src) 97.9 F (36.6 C) (Oral)  Resp 16  Ht 5\' 6"  (1.676 m)  Wt 210 lb 12.8 oz (95.618 kg)  BMI 34.04 kg/m2  SpO2 98%  LMP 10/12/2014    Objective:   Physical Exam  Physical Exam  Constitutional: She is oriented to person, place, and time. She appears well-developed and well-nourished. No distress.  HENT:  Head: Normocephalic and atraumatic.  Right Ear: Tympanic membrane and ear canal normal.  Left Ear: Tympanic membrane and ear canal normal.  Mouth/Throat: Oropharynx is clear and moist.  Eyes: Pupils are equal, round, and reactive to light. No scleral icterus.  Neck: Normal range of motion. No thyromegaly present.  Cardiovascular: Normal rate and regular rhythm.   No murmur heard. Pulmonary/Chest: Effort normal and breath sounds normal. No respiratory distress. He has no wheezes. She has no rales. She exhibits no tenderness.  Abdominal: Soft. Bowel sounds are normal. He exhibits no distension and no mass. There is no tenderness. There is no rebound and no guarding.  Musculoskeletal: She exhibits no edema.  Lymphadenopathy:    She has no cervical adenopathy.  Neurological: She is alert and oriented to person, place, and time. She has normal patellar reflexes. She exhibits normal muscle tone.  Coordination normal.  Skin: Skin is warm and dry.  Psychiatric: She has a normal mood and affect. Her behavior is normal. Judgment and thought content normal.  Breast/pelvic: deferred Assessment & Plan:         Assessment & Plan:

## 2014-10-16 NOTE — Assessment & Plan Note (Signed)
Discussed healthy diet, exercise, weight loss. Obtain routine lab work.  Immunizations up to date.

## 2014-10-17 LAB — TSH: TSH: 1.26 u[IU]/mL (ref 0.35–4.50)

## 2014-10-18 ENCOUNTER — Encounter: Payer: Self-pay | Admitting: Family

## 2014-12-27 ENCOUNTER — Encounter: Payer: Self-pay | Admitting: Physician Assistant

## 2014-12-27 ENCOUNTER — Ambulatory Visit (INDEPENDENT_AMBULATORY_CARE_PROVIDER_SITE_OTHER): Payer: 59 | Admitting: Physician Assistant

## 2014-12-27 ENCOUNTER — Other Ambulatory Visit (HOSPITAL_COMMUNITY)
Admission: RE | Admit: 2014-12-27 | Discharge: 2014-12-27 | Disposition: A | Discharge status: Home / Self Care | Payer: 59 | Source: Ambulatory Visit | Attending: Physician Assistant | Admitting: Physician Assistant

## 2014-12-27 VITALS — BP 151/97 | HR 76 | Temp 99.0°F | Resp 16 | Ht 66.0 in | Wt 214.4 lb

## 2014-12-27 DIAGNOSIS — N760 Acute vaginitis: Secondary | ICD-10-CM | POA: Insufficient documentation

## 2014-12-27 DIAGNOSIS — R399 Unspecified symptoms and signs involving the genitourinary system: Secondary | ICD-10-CM

## 2014-12-27 DIAGNOSIS — B3731 Acute candidiasis of vulva and vagina: Secondary | ICD-10-CM

## 2014-12-27 DIAGNOSIS — B373 Candidiasis of vulva and vagina: Secondary | ICD-10-CM

## 2014-12-27 LAB — POCT URINALYSIS DIPSTICK
Bilirubin, UA: NEGATIVE
Blood, UA: NEGATIVE
Glucose, UA: NEGATIVE
Ketones, UA: NEGATIVE
Nitrite, UA: NEGATIVE
Protein, UA: NEGATIVE
Spec Grav, UA: 1.015
Urobilinogen, UA: 0.2
pH, UA: 6

## 2014-12-27 MED ORDER — FLUCONAZOLE 150 MG PO TABS: ORAL_TABLET | ORAL | Status: DC

## 2014-12-27 NOTE — Patient Instructions (Signed)
Please take the Diflucan as directed. Start a daily probiotic as this will help reduce risk of yeast infections. I will call you with your results.  Call or return to clinic if symptoms are not improving.  Vaginitis Vaginitis is an inflammation of the vagina. It is most often caused by a change in the normal balance of the bacteria and yeast that live in the vagina. This change in balance causes an overgrowth of certain bacteria or yeast, which causes the inflammation. There are different types of vaginitis, but the most common types are:  Bacterial vaginosis.  Yeast infection (candidiasis).  Trichomoniasis vaginitis. This is a sexually transmitted infection (STI).  Viral vaginitis.  Atropic vaginitis.  Allergic vaginitis. CAUSES  The cause depends on the type of vaginitis. Vaginitis can be caused by:  Bacteria (bacterial vaginosis).  Yeast (yeast infection).  A parasite (trichomoniasis vaginitis)  A virus (viral vaginitis).  Low hormone levels (atrophic vaginitis). Low hormone levels can occur during pregnancy, breastfeeding, or after menopause.  Irritants, such as bubble baths, scented tampons, and feminine sprays (allergic vaginitis). Other factors can change the normal balance of the yeast and bacteria that live in the vagina. These include:  Antibiotic medicines.  Poor hygiene.  Diaphragms, vaginal sponges, spermicides, birth control pills, and intrauterine devices (IUD).  Sexual intercourse.  Infection.  Uncontrolled diabetes.  A weakened immune system. SYMPTOMS  Symptoms can vary depending on the cause of the vaginitis. Common symptoms include:  Abnormal vaginal discharge.  The discharge is white, gray, or yellow with bacterial vaginosis.  The discharge is thick, white, and cheesy with a yeast infection.  The discharge is frothy and yellow or greenish with trichomoniasis.  A bad vaginal odor.  The odor is fishy with bacterial vaginosis.  Vaginal  itching, pain, or swelling.  Painful intercourse.  Pain or burning when urinating. Sometimes, there are no symptoms. TREATMENT  Treatment will vary depending on the type of infection.   Bacterial vaginosis and trichomoniasis are often treated with antibiotic creams or pills.  Yeast infections are often treated with antifungal medicines, such as vaginal creams or suppositories.  Viral vaginitis has no cure, but symptoms can be treated with medicines that relieve discomfort. Your sexual partner should be treated as well.  Atrophic vaginitis may be treated with an estrogen cream, pill, suppository, or vaginal ring. If vaginal dryness occurs, lubricants and moisturizing creams may help. You may be told to avoid scented soaps, sprays, or douches.  Allergic vaginitis treatment involves quitting the use of the product that is causing the problem. Vaginal creams can be used to treat the symptoms. HOME CARE INSTRUCTIONS   Take all medicines as directed by your caregiver.  Keep your genital area clean and dry. Avoid soap and only rinse the area with water.  Avoid douching. It can remove the healthy bacteria in the vagina.  Do not use tampons or have sexual intercourse until your vaginitis has been treated. Use sanitary pads while you have vaginitis.  Wipe from front to back. This avoids the spread of bacteria from the rectum to the vagina.  Let air reach your genital area.  Wear cotton underwear to decrease moisture buildup.  Avoid wearing underwear while you sleep until your vaginitis is gone.  Avoid tight pants and underwear or nylons without a cotton panel.  Take off wet clothing (especially bathing suits) as soon as possible.  Use mild, non-scented products. Avoid using irritants, such as:  Scented feminine sprays.  Fabric softeners.  Scented detergents.  Scented tampons.  Scented soaps or bubble baths.  Practice safe sex and use condoms. Condoms may prevent the spread  of trichomoniasis and viral vaginitis. SEEK MEDICAL CARE IF:   You have abdominal pain.  You have a fever or persistent symptoms for more than 2-3 days.  You have a fever and your symptoms suddenly get worse. Document Released: 08/10/2007 Document Revised: 07/07/2012 Document Reviewed: 03/25/2012 [facility] Patient Information 2015 [place], [place]. This information is not intended to replace advice given to you by your health care provider. Make sure you discuss any questions you have with your health care provider.

## 2014-12-27 NOTE — Progress Notes (Signed)
Pre visit review using our clinic review tool, if applicable. No additional management support is needed unless otherwise documented below in the visit note/SLS  

## 2014-12-28 ENCOUNTER — Other Ambulatory Visit: Payer: Self-pay | Admitting: Physician Assistant

## 2014-12-28 DIAGNOSIS — B373 Candidiasis of vulva and vagina: Secondary | ICD-10-CM | POA: Insufficient documentation

## 2014-12-28 DIAGNOSIS — B3731 Acute candidiasis of vulva and vagina: Secondary | ICD-10-CM | POA: Insufficient documentation

## 2014-12-28 DIAGNOSIS — R399 Unspecified symptoms and signs involving the genitourinary system: Secondary | ICD-10-CM | POA: Insufficient documentation

## 2014-12-28 MED ORDER — FLUCONAZOLE 150 MG PO TABS: ORAL_TABLET | ORAL | Status: DC

## 2014-12-28 NOTE — Assessment & Plan Note (Signed)
UA unremarkable.  Wet prep obtained. Discharge with classic appearance of vaginal candidiasis. Will Rx Diflucan. Supportive measures discussed. Will call with results.

## 2014-12-28 NOTE — Progress Notes (Signed)
Patient presents to clinic today c/o vaginal pruritus and discharge over the past week.  Endorses history of BV and yeast infections.  Took a Monistat which helped initially. Denies vaginal pain. Denies concern for STI.  Past Medical History  Diagnosis Date  . GERD (gastroesophageal reflux disease)   . Uterine fibroid     Current Outpatient Prescriptions on File Prior to Visit  Medication Sig Dispense Refill  . dexlansoprazole (DEXILANT) 60 MG capsule Take 60 mg by mouth daily.     No current facility-administered medications on file prior to visit.    Allergies  Allergen Reactions  . Oxycodone-Acetaminophen Nausea And Vomiting and Rash    Family History  Problem Relation Age of Onset  . Hypertension Mother   . Diabetes Mother   . Hypertension Father   . COPD Father   . Heart attack Neg Hx   . Hyperlipidemia Neg Hx   . Sudden death Neg Hx     History   Social History  . Marital Status: Single    Spouse Name: N/A  . Number of Children: N/A  . Years of Education: N/A   Social History Main Topics  . Smoking status: Never Smoker   . Smokeless tobacco: Not on file  . Alcohol Use: 0.0 oz/week    0 Standard drinks or equivalent per week     Comment: occasional  . Drug Use: No  . Sexual Activity: No   Other Topics Concern  . 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  All other ROS are negative.  BP 151/97 mmHg  Pulse 76  Temp(Src) 99 F (37.2 C) (Oral)  Resp 16  Ht 5\' 6"  (1.676 m)  Wt 214 lb 6 oz (97.24 kg)  BMI 34.62 kg/m2  SpO2 100%  LMP 11/15/2014  Physical Exam  Constitutional: She is oriented to person, place, and time and well-developed,  well-nourished, and in no distress.  Genitourinary: Right adnexa normal, left adnexa normal and vulva normal. Curdy  white and vaginal discharge found.  Neurological: She is alert and oriented to person, place, and time.  Skin: Skin is warm and dry. No rash noted.  Psychiatric: Affect normal.    Recent Results (from the past 2160 hour(s))  Basic metabolic panel     Status: Abnormal   Collection Time: 10/16/14  8:01 AM  Result Value Ref Range   Sodium 137 135 - 145 mEq/L   Potassium 4.1 3.5 - 5.1 mEq/L   Chloride 107 96 - 112 mEq/L   CO2 23 19 - 32 mEq/L   Glucose, Bld 113 (H) 70 - 99 mg/dL   BUN 13 6 - 23 mg/dL   Creatinine, Ser 0.8 0.4 - 1.2 mg/dL   Calcium 8.9 8.4 - 10.5 mg/dL   GFR 106.23 >60.00 mL/min  CBC with Differential     Status: None   Collection Time: 10/16/14  8:01 AM  Result Value Ref Range   WBC 6.2 4.0 - 10.5 K/uL   RBC 4.09 3.87 - 5.11 Mil/uL   Hemoglobin 12.4 12.0 - 15.0 g/dL   HCT 38.0  36.0 - 46.0 %   MCV 92.9 78.0 - 100.0 fl   MCHC 32.7 30.0 - 36.0 g/dL   RDW 13.8 11.5 - 15.5 %   Platelets 284.0 150.0 - 400.0 K/uL   Neutrophils Relative % 63.7 43.0 - 77.0 %   Lymphocytes Relative 28.3 12.0 - 46.0 %   Monocytes Relative 5.7 3.0 - 12.0 %   Eosinophils Relative 1.7 0.0 - 5.0 %   Basophils Relative 0.6 0.0 - 3.0 %   Neutro Abs 4.0 1.4 - 7.7 K/uL   Lymphs Abs 1.8 0.7 - 4.0 K/uL   Monocytes Absolute 0.4 0.1 - 1.0 K/uL   Eosinophils Absolute 0.1 0.0 - 0.7 K/uL   Basophils Absolute 0.0 0.0 - 0.1 K/uL  Hepatic function panel     Status: None   Collection Time: 10/16/14  8:01 AM  Result Value Ref Range   Total Bilirubin 0.7 0.2 - 1.2 mg/dL   Bilirubin, Direct 0.1 0.0 - 0.3 mg/dL   Alkaline Phosphatase 60 39 - 117 U/L   AST 19 0 - 37 U/L   ALT 16 0 - 35 U/L   Total Protein 6.8 6.0 - 8.3 g/dL   Albumin 3.7 3.5 - 5.2 g/dL  Urinalysis, Routine w reflex microscopic     Status: Abnormal   Collection Time: 10/16/14  8:01 AM  Result Value Ref Range   Color,  Urine YELLOW Yellow;Lt. Yellow   APPearance CLEAR Clear   Specific Gravity, Urine 1.025 1.000-1.030   pH 6.0 5.0 - 8.0   Total Protein, Urine NEGATIVE Negative   Urine Glucose NEGATIVE Negative   Ketones, ur NEGATIVE Negative   Bilirubin Urine NEGATIVE Negative   Hgb urine dipstick MODERATE (A) Negative   Urobilinogen, UA 1.0 0.0 - 1.0   Leukocytes, UA NEGATIVE Negative   Nitrite NEGATIVE Negative   RBC / HPF 0-2/hpf 0-2/hpf   Squamous Epithelial / LPF Few(5-10/hpf) (A) Rare(0-4/hpf)  TSH     Status: None   Collection Time: 10/16/14  8:01 AM  Result Value Ref Range   TSH 1.26 0.35 - 4.50 uIU/mL  Lipid panel     Status: Abnormal   Collection Time: 10/16/14  8:01 AM  Result Value Ref Range   Cholesterol 104 0 - 200 mg/dL    Comment: ATP III Classification       Desirable:  < 200 mg/dL               Borderline High:  200 - 239 mg/dL          High:  > = 240 mg/dL   Triglycerides 47.0 0.0 - 149.0 mg/dL    Comment: Normal:  <150 mg/dLBorderline High:  150 - 199 mg/dL   HDL 34.90 (L) >39.00 mg/dL   VLDL 9.4 0.0 - 40.0 mg/dL   LDL Cholesterol 60 0 - 99 mg/dL   Total CHOL/HDL Ratio 3     Comment:                Men          Women1/2 Average Risk     3.4          3.3Average Risk          5.0          4.42X Average Risk          9.6          7.13X Average Risk          15.0  11.0                       NonHDL 69.10     Comment: NOTE:  Non-HDL goal should be 30 mg/dL higher than patient's LDL goal (i.e. LDL goal of < 70 mg/dL, would have non-HDL goal of < 100 mg/dL)  HIV antibody (with reflex)     Status: None   Collection Time: 10/16/14  8:01 AM  Result Value Ref Range   HIV 1&2 Ab, 4th Generation NONREACTIVE NONREACTIVE    Comment:   A NONREACTIVE HIV Ag/Ab result does not exclude HIV infection since the time frame for seroconversion is variable. If acute HIV infection is suspected, a HIV-1 RNA Qualitative TMA test is recommended.   HIV-1/2 Antibody Diff         Not  indicated. HIV-1 RNA, Qual TMA           Not indicated.   PLEASE NOTE: This information has been disclosed to you from records whose confidentiality may be protected by state law. If your state requires such protection, then the state law prohibits you from making any further disclosure of the information without the specific written consent of the person to whom it pertains, or as otherwise permitted by law. A general authorization for the release of medical or other information is NOT sufficient for this purpose.   The performance of this assay has not been clinically validated in patients less than 22 years old.   POCT urinalysis dipstick     Status: None   Collection Time: 12/27/14  5:50 PM  Result Value Ref Range   Color, UA straw    Clarity, UA clear    Glucose, UA neg    Bilirubin, UA neg    Ketones, UA neg    Spec Grav, UA 1.015    Blood, UA neg    pH, UA 6.0    Protein, UA neg    Urobilinogen, UA 0.2    Nitrite, UA neg    Leukocytes, UA moderate (2+)     Assessment/Plan: Candida vaginitis UA unremarkable.  Wet prep obtained. Discharge with classic appearance of vaginal candidiasis. Will Rx Diflucan. Supportive measures discussed. Will call with results.

## 2014-12-29 ENCOUNTER — Telehealth: Payer: Self-pay | Admitting: Physician Assistant

## 2014-12-29 DIAGNOSIS — N760 Acute vaginitis: Principal | ICD-10-CM

## 2014-12-29 DIAGNOSIS — B9689 Other specified bacterial agents as the cause of diseases classified elsewhere: Secondary | ICD-10-CM

## 2014-12-29 LAB — CERVICOVAGINAL ANCILLARY ONLY
Wet Prep (BD Affirm): NEGATIVE
Wet Prep (BD Affirm): POSITIVE — AB
Wet Prep (BD Affirm): POSITIVE — AB

## 2014-12-29 MED ORDER — METRONIDAZOLE 500 MG PO TABS: 500.0000 mg | ORAL_TABLET | Freq: Two times a day (BID) | ORAL | Status: DC

## 2014-12-29 NOTE — Telephone Encounter (Signed)
Patient informed, understood & agreed/SLS  

## 2014-12-29 NOTE — Telephone Encounter (Signed)
LMOM with contact name and number for return call RE: results and further provider instructions/SLS  

## 2014-12-29 NOTE — Telephone Encounter (Signed)
Wet prep was + for yeast, so the Diflucan given should take care of that.  It did also test + for bacterial vaginosis, so I have sent in a course of Flagyl for her to take as directed.  No alcohol while on this medication.  Call or return to clinic if symptoms are not improving.

## 2014-12-29 NOTE — Telephone Encounter (Signed)
Patient returned phone call. Best # [phone]. There until 5

## 2015-01-16 LAB — HM MAMMOGRAPHY

## 2015-02-12 ENCOUNTER — Ambulatory Visit: Payer: 59 | Admitting: Family

## 2015-02-15 ENCOUNTER — Telehealth: Payer: Self-pay | Admitting: Family

## 2015-02-15 NOTE — Telephone Encounter (Signed)
No

## 2015-02-15 NOTE — Telephone Encounter (Signed)
Pt was no show- called to cancel morning of-  for pap on 02/12/15- did not resched- charge?

## 2015-03-16 ENCOUNTER — Ambulatory Visit (INDEPENDENT_AMBULATORY_CARE_PROVIDER_SITE_OTHER): Payer: 59 | Admitting: Family

## 2015-03-16 ENCOUNTER — Encounter: Payer: Self-pay | Admitting: Family

## 2015-03-16 ENCOUNTER — Ambulatory Visit: Payer: 59 | Admitting: Medical

## 2015-03-16 VITALS — BP 132/90 | HR 74 | Temp 98.6°F | Resp 18 | Ht 66.0 in | Wt 218.6 lb

## 2015-03-16 DIAGNOSIS — J069 Acute upper respiratory infection, unspecified: Secondary | ICD-10-CM | POA: Diagnosis not present

## 2015-03-16 DIAGNOSIS — J029 Acute pharyngitis, unspecified: Secondary | ICD-10-CM

## 2015-03-16 LAB — POCT RAPID STREP A (OFFICE): Rapid Strep A Screen: NEGATIVE

## 2015-03-16 MED ORDER — DEXLANSOPRAZOLE 60 MG PO CPDR: 60.0000 mg | DELAYED_RELEASE_CAPSULE | Freq: Every day | ORAL | Status: DC

## 2015-03-16 NOTE — Patient Instructions (Addendum)
Upper Respiratory Infection, Adult An upper respiratory infection (URI) is also sometimes known as the common cold. The upper respiratory tract includes the nose, sinuses, throat, trachea, and bronchi. Bronchi are the airways leading to the lungs. Most people improve within 1 week, but symptoms can last up to 2 weeks. A residual cough may last even longer.  CAUSES Many different viruses can infect the tissues lining the upper respiratory tract. The tissues become irritated and inflamed and often become very moist. Mucus production is also common. A cold is contagious. You can easily spread the virus to others by oral contact. This includes kissing, sharing a glass, coughing, or sneezing. Touching your mouth or nose and then touching a surface, which is then touched by another person, can also spread the virus. SYMPTOMS  Symptoms typically develop 1 to 3 days after you come in contact with a cold virus. Symptoms vary from person to person. They may include:  Runny nose.  Sneezing.  Nasal congestion.  Sinus irritation.  Sore throat.  Loss of voice (laryngitis).  Cough.  Fatigue.  Muscle aches.  Loss of appetite.  Headache.  Low-grade fever. DIAGNOSIS  You might diagnose your own cold based on familiar symptoms, since most people get a cold 2 to 3 times a year. Your caregiver can confirm this based on your exam. Most importantly, your caregiver can check that your symptoms are not due to another disease such as strep throat, sinusitis, pneumonia, asthma, or epiglottitis. Blood tests, throat tests, and X-rays are not necessary to diagnose a common cold, but they may sometimes be helpful in excluding other more serious diseases. Your caregiver will decide if any further tests are required. RISKS AND COMPLICATIONS  You may be at risk for a more severe case of the common cold if you smoke cigarettes, have chronic heart disease (such as heart failure) or lung disease (such as asthma), or if  you have a weakened immune system. The very young and very old are also at risk for more serious infections. Bacterial sinusitis, middle ear infections, and bacterial pneumonia can complicate the common cold. The common cold can worsen asthma and chronic obstructive pulmonary disease (COPD). Sometimes, these complications can require emergency medical care and may be life-threatening. PREVENTION  The best way to protect against getting a cold is to practice good hygiene. Avoid oral or hand contact with people with cold symptoms. Wash your hands often if contact occurs. There is no clear evidence that vitamin C, vitamin E, echinacea, or exercise reduces the chance of developing a cold. However, it is always recommended to get plenty of rest and practice good nutrition. TREATMENT  Treatment is directed at relieving symptoms. There is no cure. Antibiotics are not effective, because the infection is caused by a virus, not by bacteria. Treatment may include:  Increased fluid intake. Sports drinks offer valuable electrolytes, sugars, and fluids.  Breathing heated mist or steam (vaporizer or shower).  Eating chicken soup or other clear broths, and maintaining good nutrition.  Getting plenty of rest.  Using gargles or lozenges for comfort.  Controlling fevers with ibuprofen or acetaminophen as directed by your caregiver.  Increasing usage of your inhaler if you have asthma. Zinc gel and zinc lozenges, taken in the first 24 hours of the common cold, can shorten the duration and lessen the severity of symptoms. Pain medicines may help with fever, muscle aches, and throat pain. A variety of non-prescription medicines are available to treat congestion and runny nose. Your caregiver   can make recommendations and may suggest nasal or lung inhalers for other symptoms.  HOME CARE INSTRUCTIONS   Only take over-the-counter or prescription medicines for pain, discomfort, or fever as directed by your  caregiver.  Use a warm mist humidifier or inhale steam from a shower to increase air moisture. This may keep secretions moist and make it easier to breathe.  Drink enough water and fluids to keep your urine clear or pale yellow.  Rest as needed.  Return to work when your temperature has returned to normal or as your caregiver advises. You may need to stay home longer to avoid infecting others. You can also use a face mask and careful hand washing to prevent spread of the virus. SEEK MEDICAL CARE IF:   After the first few days, you feel you are getting worse rather than better.  You need your caregiver's advice about medicines to control symptoms.  You develop chills, worsening shortness of breath, or brown or red sputum. These may be signs of pneumonia.  You develop yellow or brown nasal discharge or pain in the face, especially when you bend forward. These may be signs of sinusitis.  You develop a fever, swollen neck glands, pain with swallowing, or white areas in the back of your throat. These may be signs of strep throat. SEEK IMMEDIATE MEDICAL CARE IF:   You have a fever.  You develop severe or persistent headache, ear pain, sinus pain, or chest pain.  You develop wheezing, a prolonged cough, cough up blood, or have a change in your usual mucus (if you have chronic lung disease).  You develop sore muscles or a stiff neck. Document Released: 04/08/2001 Document Revised: 01/05/2012 Document Reviewed: 01/18/2014 [employer] Patient Information 2015 [employer]. This information is not intended to replace advice given to you by your health care provider. Make sure you discuss any questions you have with your health care provider.  

## 2015-03-16 NOTE — Assessment & Plan Note (Signed)
Rapid strep is negative. Advised pt to call if symptoms worsen or if not improved in 2-3 days. Advised hydration, motrin prn.

## 2015-03-16 NOTE — Progress Notes (Signed)
Pre visit review using our clinic review tool, if applicable. No additional management support is needed unless otherwise documented below in the visit note. 

## 2015-03-16 NOTE — Progress Notes (Signed)
Subjective:    Patient ID: Denise Austin, female    DOB: November 09, 1970, 44 y.o.   MRN: [id number]  HPI  Ms. Denise Austin is a 44 yr old female who presents today with chief complaint of sore throat. Sore throat began on 03/11/15. Developed fever on 5/16 (99). Has associated cough and congestion. Denies sick contacts. Has tried dayquil/benadryl without much improvement in her symptoms.   Review of Systems See HPI  Past Medical History  Diagnosis Date  . GERD (gastroesophageal reflux disease)   . Uterine fibroid     History   Social History  . Marital Status: Single    Spouse Name: N/A  . Number of Children: N/A  . Years of Education: N/A   Occupational History  . Not on file.   Social History Main Topics  . Smoking status: Never Smoker   . Smokeless tobacco: Not on file  . Alcohol Use: 0.0 oz/week    0 Standard drinks or equivalent per week     Comment: occasional  . Drug Use: No  . Sexual Activity: No   Other Topics Concern  . 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 Cholecystectomy    . Bunionectomy      Family History  Problem Relation Age of Onset  . Hypertension Mother   . Diabetes Mother   . Hypertension Father   . COPD Father   . Heart attack Neg Hx   . Hyperlipidemia Neg Hx   . Sudden death Neg Hx     Allergies  Allergen Reactions  . Oxycodone-Acetaminophen Nausea And Vomiting and Rash    Current Outpatient Prescriptions on File Prior to Visit  Medication Sig Dispense Refill  . dexlansoprazole (DEXILANT) 60 MG capsule Take 60 mg by mouth daily.    . valACYclovir (VALTREX) 500 MG tablet Take 500 mg by mouth  daily.     No current facility-administered medications on file prior to visit.    BP 132/90 mmHg  Pulse 74  Temp(Src) 98.6 F (37 C) (Oral)  Resp 18  Ht 5\' 6"  (1.676 m)  Wt 218 lb 9.6 oz (99.156 kg)  BMI 35.30 kg/m2  SpO2 99%  LMP 11/27/2014       Objective:   Physical Exam  Constitutional: She is oriented to person, place, and time. She appears well-developed and well-nourished.  HENT:  Right Ear: Tympanic membrane and ear canal normal.  Left Ear: Tympanic membrane and ear canal normal.  Mouth/Throat: No posterior oropharyngeal edema or posterior oropharyngeal erythema.  Cardiovascular: Normal rate, regular rhythm and normal heart sounds.   No murmur heard. Pulmonary/Chest: Effort normal and breath sounds normal. No respiratory distress. She has no wheezes.  Musculoskeletal: She exhibits no edema.  Lymphadenopathy:    She has no cervical adenopathy.  Neurological: She is alert and oriented to person, place, and time.  Skin: Skin is warm and dry.  Psychiatric: She has a normal mood and affect. Her behavior is normal. Judgment and thought content normal.          Assessment & Plan:

## 2015-03-19 ENCOUNTER — Telehealth: Payer: Self-pay | Admitting: Family

## 2015-03-19 MED ORDER — AMOXICILLIN-POT CLAVULANATE 875-125 MG PO TABS: 1.0000 | ORAL_TABLET | Freq: Two times a day (BID) | ORAL | Status: DC

## 2015-03-19 MED ORDER — BENZONATATE 100 MG PO CAPS: 100.0000 mg | ORAL_CAPSULE | Freq: Three times a day (TID) | ORAL | Status: DC | PRN

## 2015-03-19 NOTE — Telephone Encounter (Signed)
Mychart message sent to patient.

## 2015-03-19 NOTE — Telephone Encounter (Signed)
Please let pt know that I sent rx for augmentin to [facility] along with tessalon. If not improved in 3 days or if symptoms worsen, should be seen back in the office.

## 2015-03-19 NOTE — Telephone Encounter (Signed)
Relation to pt: self  Call back number: [phone]   Reason for call:  Pt was seen 03/16/15  Symptoms have not improved experencing head congestion and sore throat.

## 2015-03-19 NOTE — Telephone Encounter (Signed)
Pt called stating her head pressure, congestion and sore throat are the same. Has continued the sudafed, dayquil and nyquil that she was taking when she came to see [place] last week.  Pt requests if Rx is sent to pharmacy before 12:30 to send to [place] if after 12:30pm today.  Please advise.

## 2015-10-19 ENCOUNTER — Encounter: Payer: 59 | Admitting: Family

## 2015-10-30 MED FILL — DEXILANT DR 60 MG CAPSULE: 60 | 60 days supply | Qty: 60 | Fill #2

## 2015-11-16 ENCOUNTER — Encounter: Payer: 59 | Admitting: Family

## 2015-11-19 ENCOUNTER — Encounter: Payer: 59 | Admitting: Family

## 2015-11-30 MED FILL — AZITHROMYCIN 250 MG TABLET: 250 | 5 days supply | Qty: 6 | Fill #0

## 2015-12-11 DIAGNOSIS — J181 Lobar pneumonia, unspecified organism: Secondary | ICD-10-CM | POA: Diagnosis not present

## 2015-12-11 MED FILL — AMOX TR-K CLV 875-125 MG TA: 875-125 | 10 days supply | Qty: 20 | Fill #0

## 2015-12-11 MED FILL — FLUCONAZOLE 150 MG TABLET: 150 | 3 days supply | Qty: 2 | Fill #0

## 2015-12-11 MED FILL — HYDROCODONE-CHLORPHENIRAM S: 10-8 | 10 days supply | Qty: 100 | Fill #0

## 2015-12-17 ENCOUNTER — Encounter: Payer: Self-pay | Admitting: Family

## 2015-12-17 ENCOUNTER — Ambulatory Visit (INDEPENDENT_AMBULATORY_CARE_PROVIDER_SITE_OTHER): Payer: 59 | Admitting: Family

## 2015-12-17 VITALS — BP 146/106 | HR 78 | Temp 98.8°F | Resp 16 | Ht 66.0 in | Wt 215.4 lb

## 2015-12-17 DIAGNOSIS — R0989 Other specified symptoms and signs involving the circulatory and respiratory systems: Secondary | ICD-10-CM

## 2015-12-17 DIAGNOSIS — J989 Respiratory disorder, unspecified: Secondary | ICD-10-CM | POA: Insufficient documentation

## 2015-12-17 MED ORDER — PREDNISONE 10 MG PO TABS
ORAL_TABLET | ORAL | Status: DC
Start: 2015-12-17 — End: 2015-12-24

## 2015-12-17 MED ORDER — ALBUTEROL SULFATE HFA 108 (90 BASE) MCG/ACT IN AERS: 2.0000 | INHALATION_SPRAY | Freq: Four times a day (QID) | RESPIRATORY_TRACT | Status: DC | PRN

## 2015-12-17 NOTE — Patient Instructions (Signed)
Please complete augmentin. Start albuterol inhaler 2 puffs every 6 hours as needed. Start prednisone taper. Call if you develop fever, if symptoms worsen, or if symptoms do not improve.

## 2015-12-17 NOTE — Progress Notes (Signed)
Pre visit review using our clinic review tool, if applicable. No additional management support is needed unless otherwise documented below in the visit note. 

## 2015-12-17 NOTE — Progress Notes (Signed)
Subjective:    Patient ID: Denise Austin, female    DOB: 01-03-1971, 45 y.o.   MRN: [id number]  HPI  Ms. Denise Austin is a 45 yr old female who presents today with c/o cough. Cough has bene present since the end of January.  Completed zpak and now on augmentin without improvement in her symptoms. Reports + fever Saturday "subjective" woke up drenched in sweat.  + HA, using aleve PRN.    Review of Systems  See HPI  Past Medical History  Diagnosis Date  . GERD (gastroesophageal reflux disease)   . Uterine fibroid     Social History   Social History  . Marital Status: Single    Spouse Name: Denise Austin  . Number of Children: Denise Austin  . Years of Education: Denise Austin   Occupational History  . Not on file.   Social History Main Topics  . Smoking status: Never Smoker   . Smokeless tobacco: Not on file  . Alcohol Use: 0.0 oz/week    0 Standard drinks or equivalent per week     Comment: occasional  . Drug Use: No  . Sexual Activity: No   Other Topics Concern  . 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 Cholecystectomy    . Bunionectomy      Family History  Problem Relation Age of Onset  . Hypertension Mother   . Diabetes Mother   . Hypertension Father   . COPD Father   . Heart attack Neg Hx   . Hyperlipidemia Neg Hx   . Sudden death Neg Hx     Allergies  Allergen Reactions  . Oxycodone-Acetaminophen Nausea And Vomiting and Rash    Current Outpatient Prescriptions on File Prior to Visit  Medication Sig Dispense Refill  . amoxicillin-clavulanate (AUGMENTIN) 875-125 MG per tablet Take 1 tablet by mouth 2 (two) times daily. 20 tablet 0  .  dexlansoprazole (DEXILANT) 60 MG capsule Take 1 capsule (60 mg total) by mouth daily. 30 capsule 5  . valACYclovir (VALTREX) 500 MG tablet Take 500 mg by mouth daily.     No current facility-administered medications on file prior to visit.    BP 146/106 mmHg  Pulse 78  Temp(Src) 98.8 F (37.1 C) (Oral)  Resp 16  Ht 5\' 6"  (1.676 m)  Wt 215 lb 6.4 oz (97.705 kg)  BMI 34.78 kg/m2  SpO2 100%  LMP 12/12/2015        Objective:   Physical Exam  Constitutional: She is oriented to person, place, and time. She appears well-developed and well-nourished.  HENT:  Head: Normocephalic and atraumatic.  L TM is mildly erythematous R TM is normal  Neck: No thyromegaly present.  Pulmonary/Chest: She has decreased breath sounds. She has no wheezes. She has no rhonchi. She has no rales.  Musculoskeletal: She exhibits no edema.  Lymphadenopathy:    She has no cervical adenopathy.  Neurological: She is alert and oriented to person, place, and time.  Skin: Skin is warm and dry.  Psychiatric: She has a normal mood and affect. Her behavior is normal. Judgment  and thought content normal.          Assessment & Plan:

## 2015-12-17 NOTE — Assessment & Plan Note (Signed)
I think pt has resolving bronchitis but cough is mainly due to reactive airway symptoms. Will rx with albuterol and pred taper. Pt is advised to complete augmentin. Call if fever, if symptoms worsen, or if symptoms do not improve.

## 2015-12-18 MED FILL — VENTOLIN HFA 90 MCG INHALER: 108 (90 BAS | 25 days supply | Qty: 18 | Fill #0

## 2015-12-21 ENCOUNTER — Telehealth: Payer: Self-pay | Admitting: Behavioral Health

## 2015-12-21 NOTE — Telephone Encounter (Signed)
Unable to reach patient at time of Pre-Visit Call.  Left message for patient to return call when available.    

## 2015-12-24 ENCOUNTER — Ambulatory Visit (INDEPENDENT_AMBULATORY_CARE_PROVIDER_SITE_OTHER): Payer: 59 | Admitting: Family

## 2015-12-24 ENCOUNTER — Encounter: Payer: Self-pay | Admitting: Family

## 2015-12-24 VITALS — BP 142/90 | HR 73 | Temp 98.4°F | Resp 16 | Ht 65.0 in | Wt 216.6 lb

## 2015-12-24 DIAGNOSIS — I10 Essential (primary) hypertension: Secondary | ICD-10-CM

## 2015-12-24 DIAGNOSIS — E049 Nontoxic goiter, unspecified: Secondary | ICD-10-CM | POA: Diagnosis not present

## 2015-12-24 DIAGNOSIS — Z0000 Encounter for general adult medical examination without abnormal findings: Secondary | ICD-10-CM | POA: Diagnosis not present

## 2015-12-24 DIAGNOSIS — Z1231 Encounter for screening mammogram for malignant neoplasm of breast: Secondary | ICD-10-CM

## 2015-12-24 LAB — URINALYSIS, ROUTINE W REFLEX MICROSCOPIC
Bilirubin Urine: NEGATIVE
Hgb urine dipstick: NEGATIVE
Ketones, ur: NEGATIVE
Leukocytes, UA: NEGATIVE
Nitrite: NEGATIVE
RBC / HPF: NONE SEEN (ref 0–?)
Specific Gravity, Urine: 1.02 (ref 1.000–1.030)
Total Protein, Urine: NEGATIVE
Urine Glucose: NEGATIVE
Urobilinogen, UA: 0.2 (ref 0.0–1.0)
WBC, UA: NONE SEEN (ref 0–?)
pH: 6 (ref 5.0–8.0)

## 2015-12-24 LAB — BASIC METABOLIC PANEL
BUN: 16 mg/dL (ref 6–23)
CO2: 27 mEq/L (ref 19–32)
Calcium: 9.2 mg/dL (ref 8.4–10.5)
Chloride: 103 mEq/L (ref 96–112)
Creatinine, Ser: 0.77 mg/dL (ref 0.40–1.20)
GFR: 104.08 mL/min (ref >60.00)
Glucose, Bld: 121 mg/dL — ABNORMAL HIGH (ref 70–99)
Potassium: 3.8 mEq/L (ref 3.5–5.1)
Sodium: 138 mEq/L (ref 135–145)

## 2015-12-24 LAB — LIPID PANEL
Cholesterol: 133 mg/dL (ref 0–200)
HDL: 58.2 mg/dL (ref >39.00)
LDL Cholesterol: 63 mg/dL (ref 0–99)
NonHDL: 74.58
Total CHOL/HDL Ratio: 2
Triglycerides: 60 mg/dL (ref 0.0–149.0)
VLDL: 12 mg/dL (ref 0.0–40.0)

## 2015-12-24 LAB — CBC WITH DIFFERENTIAL/PLATELET
Basophils Absolute: 0.1 10*3/uL (ref 0.0–0.1)
Basophils Relative: 0.5 % (ref 0.0–3.0)
Eosinophils Absolute: 0 10*3/uL (ref 0.0–0.7)
Eosinophils Relative: 0.3 % (ref 0.0–5.0)
HCT: 38.7 % (ref 36.0–46.0)
Hemoglobin: 13.2 g/dL (ref 12.0–15.0)
Lymphocytes Relative: 19.8 % (ref 12.0–46.0)
Lymphs Abs: 2.1 10*3/uL (ref 0.7–4.0)
MCHC: 34 g/dL (ref 30.0–36.0)
MCV: 92 fl (ref 78.0–100.0)
Monocytes Absolute: 0.5 10*3/uL (ref 0.1–1.0)
Monocytes Relative: 4.7 % (ref 3.0–12.0)
Neutro Abs: 7.9 10*3/uL — ABNORMAL HIGH (ref 1.4–7.7)
Neutrophils Relative %: 74.7 % (ref 43.0–77.0)
Platelets: 339 10*3/uL (ref 150.0–400.0)
RBC: 4.21 Mil/uL (ref 3.87–5.11)
RDW: 13.9 % (ref 11.5–15.5)
WBC: 10.6 10*3/uL — ABNORMAL HIGH (ref 4.0–10.5)

## 2015-12-24 LAB — HEPATIC FUNCTION PANEL
ALT: 15 U/L (ref 0–35)
AST: 14 U/L (ref 0–37)
Albumin: 4 g/dL (ref 3.5–5.2)
Alkaline Phosphatase: 54 U/L (ref 39–117)
Bilirubin, Direct: 0.1 mg/dL (ref 0.0–0.3)
Total Bilirubin: 0.6 mg/dL (ref 0.2–1.2)
Total Protein: 7.1 g/dL (ref 6.0–8.3)

## 2015-12-24 LAB — HIV ANTIBODY (ROUTINE TESTING W REFLEX): HIV 1&2 Ab, 4th Generation: NONREACTIVE

## 2015-12-24 LAB — TSH: TSH: 0.68 u[IU]/mL (ref 0.35–4.50)

## 2015-12-24 MED ORDER — VALACYCLOVIR HCL 500 MG PO TABS
500.0000 mg | ORAL_TABLET | Freq: Every day | ORAL | Status: DC
Start: 2015-12-24 — End: 2016-07-25

## 2015-12-24 MED ORDER — AMLODIPINE BESYLATE 5 MG PO TABS: 5.0000 mg | ORAL_TABLET | Freq: Every day | ORAL | Status: DC

## 2015-12-24 MED ORDER — DEXLANSOPRAZOLE 60 MG PO CPDR: 60.0000 mg | DELAYED_RELEASE_CAPSULE | Freq: Every day | ORAL | Status: DC

## 2015-12-24 MED FILL — VALACYCLOVIR HCL 500 MG TAB: 500 | 90 days supply | Qty: 90 | Fill #0

## 2015-12-24 MED FILL — AMLODIPINE BESYLATE 5 MG TA: 5 | 30 days supply | Qty: 30 | Fill #0

## 2015-12-24 MED FILL — DEXILANT DR 60 MG CAPSULE: 60 | 90 days supply | Qty: 90 | Fill #0

## 2015-12-24 NOTE — Assessment & Plan Note (Addendum)
Discussed healthy diet, exercise, weight loss- including making healthier diet choices and using my fitness pal to count calories. She will call in May when it is time for her mammogram and we will place order for the [place].

## 2015-12-24 NOTE — Patient Instructions (Addendum)
Please complete lab work prior to leaving. For constipation- restart Align probiotic. Also, you can add miralax 1 capful in your smoothie once daily as needed for constipation.  Please start amlodipine for your pressure. Continue regular walking. Count all calories using my fitness pal.  Set up program with goal weight loss of 1-2 pounds per week. Follow up in 2-3 weeks for nurse visit to recheck you blood pressure.

## 2015-12-24 NOTE — Assessment & Plan Note (Signed)
Deteriorated- has been high last 2 visits.  Previously on meds. Will initiate amlodipine.

## 2015-12-24 NOTE — Addendum Note (Signed)
Addended by: Debbrah Alar on: 12/24/2015 03:47 PM   Modules accepted: Orders, SmartSet

## 2015-12-24 NOTE — Progress Notes (Signed)
Pre visit review using our clinic review tool, if applicable. No additional management support is needed unless otherwise documented below in the visit note. 

## 2015-12-24 NOTE — Addendum Note (Signed)
Addended by: Kelle Darting A on: 12/24/2015 08:24 AM   Modules accepted: Orders

## 2015-12-24 NOTE — Progress Notes (Addendum)
Subjective:    Patient ID: Denise Austin, female    DOB: 02-Oct-1971, 45 y.o.   MRN: [id number]  HPI  Patient presents today for complete physical.  Immunizations: Tdap and flu shot up to date Diet: see below Exercise: walks 30 min 5 days a week.   Pap Smear: 3/16 Mammogram: 5/16  Boiled 2 boiled eggs  Smoothie- mango bananas, pineapple, spinach, greek yogurt, 1/2 cup oj-  Lunch- sub at [employer]- [place], spinach onions mush may 6 inch  Sweet tea 16-20 ounce-  Dinner baked pork chops , brocc/cheese/rice  Wt Readings from Last 3 Encounters:  12/24/15 216 lb 9.6 oz (98.249 kg)  12/17/15 215 lb 6.4 oz (97.705 kg)  03/16/15 218 lb 9.6 oz (99.156 kg)   HTN- reports that for the last 6 months her BP has been borderline.   BP Readings from Last 3 Encounters:  12/24/15 142/90  12/17/15 146/106  03/16/15 132/90     Review of Systems  Constitutional: Negative for unexpected weight change.  HENT: Negative for rhinorrhea.   Respiratory: Negative for shortness of breath.   Cardiovascular: Negative for leg swelling.  Gastrointestinal: Positive for constipation. Negative for diarrhea.  Genitourinary: Negative for dysuria, frequency and menstrual problem.  Musculoskeletal: Negative for myalgias and arthralgias.  Skin: Negative for rash.  Neurological: Negative for headaches.  Hematological: Negative for adenopathy.  Psychiatric/Behavioral:       Denies depression/anxiety   Past Medical History  Diagnosis Date  . GERD (gastroesophageal reflux disease)   . Uterine fibroid     Social History   Social History  . Marital Status: Single    Spouse Name: N/A  . Number of Children: N/A  . Years of Education: N/A   Occupational History  . Not on file.   Social History Main Topics  . Smoking status: Never Smoker   . Smokeless tobacco: Not on file  . Alcohol Use: 0.0 oz/week    0 Standard drinks or equivalent per week     Comment: occasional  . Drug Use: No  . Sexual  Activity: No   Other Topics Concern  . 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 Cholecystectomy    . Bunionectomy      Family History  Problem Relation Age of Onset  . Hypertension Mother   . Diabetes Mother   . Hypertension Father   . COPD Father   . Heart attack Neg Hx   . Hyperlipidemia Neg Hx   . Sudden death Neg Hx     Allergies  Allergen Reactions  . Oxycodone-Acetaminophen Nausea And Vomiting and Rash    Current Outpatient Prescriptions on File Prior to Visit  Medication Sig Dispense Refill  . albuterol (PROVENTIL HFA;VENTOLIN HFA) 108 (90 Base) MCG/ACT inhaler Inhale 2 puffs into the lungs every 6 (six) hours as needed for wheezing or shortness of breath. 1 Inhaler 0  . dexlansoprazole (DEXILANT) 60 MG capsule Take 1 capsule (60 mg total) by mouth daily. 30 capsule 5  . valACYclovir (VALTREX) 500 MG tablet Take 500 mg by mouth daily.  No current facility-administered medications on file prior to visit.    BP 142/90 mmHg  Pulse 73  Temp(Src) 98.4 F (36.9 C) (Oral)  Resp 16  Ht 5\' 5"  (1.651 m)  Wt 216 lb 9.6 oz (98.249 kg)  BMI 36.04 kg/m2  SpO2 98%  LMP 12/12/2015       Objective:   Physical Exam Physical Exam  Constitutional: She is oriented to person, place, and time. She appears well-developed and well-nourished. No distress.  HENT:  Head: Normocephalic and atraumatic.  Right Ear: Tympanic membrane and ear canal normal.  Left Ear: Tympanic membrane and ear canal normal.  Mouth/Throat: Oropharynx is clear and moist.  Eyes: Pupils are equal, round, and reactive to light. No scleral icterus.  Neck: Normal  range of motion.  thyromegaly present- R lobe appears mildly larger than left lobe  Cardiovascular: Normal rate and regular rhythm.   No murmur heard. Pulmonary/Chest: Effort normal and breath sounds normal. No respiratory distress. He has no wheezes. She has no rales. She exhibits no tenderness.  Abdominal: Soft. Bowel sounds are normal. He exhibits no distension and no mass. There is no tenderness. There is no rebound and no guarding.  Musculoskeletal: She exhibits no edema.  Lymphadenopathy:    She has no cervical adenopathy.  Neurological: She is alert and oriented to person, place, and time. She has normal patellar reflexes. She exhibits normal muscle tone. Coordination normal.  Skin: Skin is warm and dry.  Psychiatric: She has a normal mood and affect. Her behavior is normal. Judgment and thought content normal.  Breasts: Examined lying Right: Without masses, retractions, discharge or axillary adenopathy.  Left: Without masses, retractions, discharge or axillary adenopathy.  Pelvic: deferred    Assessment & Plan:          Assessment & Plan:  EKG tracing is personally reviewed.  EKG notes NSR.  No acute changes.   Thyroid enlargement- send for ultrasound for further evaluation, obtain TSH.

## 2015-12-25 ENCOUNTER — Encounter: Payer: Self-pay | Admitting: Family

## 2015-12-26 ENCOUNTER — Other Ambulatory Visit (INDEPENDENT_AMBULATORY_CARE_PROVIDER_SITE_OTHER): Payer: 59

## 2015-12-26 DIAGNOSIS — R739 Hyperglycemia, unspecified: Secondary | ICD-10-CM

## 2015-12-26 LAB — HEMOGLOBIN A1C: Hgb A1c MFr Bld: 5.7 % (ref 4.6–6.5)

## 2015-12-27 ENCOUNTER — Ambulatory Visit (HOSPITAL_BASED_OUTPATIENT_CLINIC_OR_DEPARTMENT_OTHER)
Admission: RE | Admit: 2015-12-27 | Discharge: 2015-12-27 | Disposition: A | Discharge status: Home / Self Care | Payer: 59 | Source: Ambulatory Visit | Attending: Family | Admitting: Family

## 2015-12-27 DIAGNOSIS — E042 Nontoxic multinodular goiter: Secondary | ICD-10-CM | POA: Diagnosis not present

## 2015-12-27 DIAGNOSIS — E049 Nontoxic goiter, unspecified: Secondary | ICD-10-CM

## 2015-12-28 ENCOUNTER — Encounter: Payer: Self-pay | Admitting: Family

## 2016-01-01 ENCOUNTER — Encounter: Payer: Self-pay | Admitting: Family

## 2016-01-02 ENCOUNTER — Telehealth: Payer: Self-pay | Admitting: Family

## 2016-01-02 NOTE — Telephone Encounter (Signed)
FYI

## 2016-01-02 NOTE — Telephone Encounter (Signed)
Scheduled pt on PCP schedule for BP check because she says that she need to come in early in the morning sooner than nurse schedule will allow. Pt says that she would need to see her PCP instead of the nurse.

## 2016-01-02 NOTE — Telephone Encounter (Signed)
Noted  

## 2016-01-09 ENCOUNTER — Encounter: Payer: Self-pay | Admitting: Family

## 2016-01-09 ENCOUNTER — Ambulatory Visit (INDEPENDENT_AMBULATORY_CARE_PROVIDER_SITE_OTHER): Payer: 59 | Admitting: Family

## 2016-01-09 VITALS — BP 128/96 | HR 89 | Temp 98.0°F | Resp 16 | Ht 65.0 in | Wt 215.8 lb

## 2016-01-09 DIAGNOSIS — I10 Essential (primary) hypertension: Secondary | ICD-10-CM | POA: Diagnosis not present

## 2016-01-09 MED ORDER — AMLODIPINE BESYLATE 10 MG PO TABS: 10.0000 mg | ORAL_TABLET | Freq: Every day | ORAL | Status: DC

## 2016-01-09 NOTE — Progress Notes (Signed)
Subjective:    Patient ID: Denise Austin, female    DOB: 27-Aug-1971, 45 y.o.   MRN: [id number]  HPI  Denise Austin is a 45 yr old female who presents today for follow up of her blood pressure.  BP Readings from Last 3 Encounters:  01/09/16 128/96  12/24/15 142/90  12/17/15 146/106      Review of Systems  Respiratory: Negative for shortness of breath.   Cardiovascular: Negative for chest pain, palpitations and leg swelling.  Neurological: Negative for headaches.   Past Medical History  Diagnosis Date  . GERD (gastroesophageal reflux disease)   . Uterine fibroid     Social History   Social History  . Marital Status: Single    Spouse Name: N/A  . Number of Children: N/A  . Years of Education: N/A   Occupational History  . Not on file.   Social History Main Topics  . Smoking status: Never Smoker   . Smokeless tobacco: Not on file  . Alcohol Use: 0.0 oz/week    0 Standard drinks or equivalent per week     Comment: occasional  . Drug Use: No  . Sexual Activity: No   Other Topics Concern  . 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 Cholecystectomy    . Bunionectomy      Family History  Problem Relation Age of Onset  . Hypertension Mother   . Diabetes Mother   . Hypertension Father     smoker  . COPD Father   . Heart attack Neg Hx   . Hyperlipidemia Neg Hx   . Sudden death Neg Hx     Allergies  Allergen Reactions  . Oxycodone-Acetaminophen Nausea And Vomiting and Rash    Current Outpatient Prescriptions on File Prior to Visit  Medication Sig Dispense Refill  . albuterol (PROVENTIL HFA;VENTOLIN HFA) 108 (90  Base) MCG/ACT inhaler Inhale 2 puffs into the lungs every 6 (six) hours as needed for wheezing or shortness of breath. 1 Inhaler 0  . amLODipine (NORVASC) 5 MG tablet Take 1 tablet (5 mg total) by mouth daily. 30 tablet 5  . dexlansoprazole (DEXILANT) 60 MG capsule Take 1 capsule (60 mg total) by mouth daily. 90 capsule 1  . valACYclovir (VALTREX) 500 MG tablet Take 1 tablet (500 mg total) by mouth daily. 90 tablet 1   No current facility-administered medications on file prior to visit.    BP 128/96 mmHg  Pulse 89  Temp(Src) 98 F (36.7 C) (Oral)  Resp 16  Ht 5\' 5"  (1.651 m)  Wt 215 lb 12.8 oz (97.886 kg)  BMI 35.91 kg/m2  SpO2 100%  LMP 12/12/2015       Objective:   Physical Exam  Constitutional: She is oriented to person, place, and time. She appears well-developed and well-nourished.  HENT:  Head: Normocephalic and atraumatic.  Cardiovascular: Normal rate, regular rhythm and normal heart sounds.   No murmur heard. Pulmonary/Chest: Effort normal and breath sounds normal. No respiratory distress. She has no wheezes.  Musculoskeletal: She exhibits no edema.  Neurological: She  is alert and oriented to person, place, and time.  Psychiatric: She has a normal mood and affect. Her behavior is normal. Judgment and thought content normal.          Assessment & Plan:

## 2016-01-09 NOTE — Progress Notes (Signed)
Pre visit review using our clinic review tool, if applicable. No additional management support is needed unless otherwise documented below in the visit note. 

## 2016-01-09 NOTE — Patient Instructions (Signed)
Increase amlodipine from 5mg to 10mg

## 2016-01-09 NOTE — Assessment & Plan Note (Signed)
Improving but DBP is above goal. Increase amlodipine from 5mg  to 10mg  once daily. Follow up in 1 month.

## 2016-01-21 MED FILL — AMLODIPINE BESYLATE 10 MG T: 10 | 30 days supply | Qty: 30 | Fill #0

## 2016-01-22 ENCOUNTER — Ambulatory Visit (HOSPITAL_BASED_OUTPATIENT_CLINIC_OR_DEPARTMENT_OTHER): Payer: 59

## 2016-02-11 ENCOUNTER — Ambulatory Visit (HOSPITAL_BASED_OUTPATIENT_CLINIC_OR_DEPARTMENT_OTHER)
Admission: RE | Admit: 2016-02-11 | Discharge: 2016-02-11 | Disposition: A | Discharge status: Home / Self Care | Payer: 59 | Source: Ambulatory Visit | Attending: Family | Admitting: Family

## 2016-02-11 ENCOUNTER — Encounter: Payer: Self-pay | Admitting: Family

## 2016-02-11 ENCOUNTER — Ambulatory Visit (INDEPENDENT_AMBULATORY_CARE_PROVIDER_SITE_OTHER): Payer: 59 | Admitting: Family

## 2016-02-11 VITALS — HR 70 | Temp 98.3°F | Resp 18 | Ht 65.0 in | Wt 216.2 lb

## 2016-02-11 DIAGNOSIS — I10 Essential (primary) hypertension: Secondary | ICD-10-CM

## 2016-02-11 DIAGNOSIS — Z1231 Encounter for screening mammogram for malignant neoplasm of breast: Secondary | ICD-10-CM | POA: Insufficient documentation

## 2016-02-11 DIAGNOSIS — H52223 Regular astigmatism, bilateral: Secondary | ICD-10-CM | POA: Diagnosis not present

## 2016-02-11 DIAGNOSIS — M79672 Pain in left foot: Secondary | ICD-10-CM | POA: Diagnosis not present

## 2016-02-11 DIAGNOSIS — H5213 Myopia, bilateral: Secondary | ICD-10-CM | POA: Diagnosis not present

## 2016-02-11 MED ORDER — METOPROLOL SUCCINATE ER 25 MG PO TB24: 25.0000 mg | ORAL_TABLET | Freq: Every day | ORAL | Status: DC

## 2016-02-11 MED FILL — METOPROLOL SUCC ER 25 MG TA: 25 | 30 days supply | Qty: 30 | Fill #0

## 2016-02-11 NOTE — Progress Notes (Signed)
Subjective:    Patient ID: Denise Austin, female    DOB: [birth year], 45 y.o.   MRN: [id number]  HPI  Denise Austin is a 45 yr old female who presents today for follow up of her hypertension. She is currently maintained on amlodipine 10mg  once daily.  BP Readings from Last 3 Encounters:  01/09/16 128/96  12/24/15 142/90  12/17/15 146/106    Review of Systems  Respiratory: Negative for shortness of breath.   Cardiovascular: Negative for chest pain and leg swelling.       Past Medical History  Diagnosis Date  . GERD (gastroesophageal reflux disease)   . Uterine fibroid      Social History   Social History  . Marital Status: Single    Spouse Name: N/A  . Number of Children: N/A  . Years of Education: N/A   Occupational History  . Not on file.   Social History Main Topics  . Smoking status: Never Smoker   . Smokeless tobacco: Not on file  . Alcohol Use: 0.0 oz/week    0 Standard drinks or equivalent per week     Comment: occasional  . Drug Use: No  . Sexual Activity: No   Other Topics Concern  . 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 Cholecystectomy    . Bunionectomy      Family History  Problem Relation Age of Onset  . Hypertension Mother   . Diabetes Mother   . Hypertension Father     smoker  . COPD Father   . Heart attack Neg Hx   . Hyperlipidemia Neg Hx   . Sudden death Neg Hx     Allergies  Allergen Reactions  . Oxycodone-Acetaminophen Nausea And Vomiting and Rash    Current Outpatient Prescriptions on File Prior to Visit  Medication Sig Dispense Refill  . albuterol (PROVENTIL HFA;VENTOLIN HFA) 108  (90 Base) MCG/ACT inhaler Inhale 2 puffs into the lungs every 6 (six) hours as needed for wheezing or shortness of breath. 1 Inhaler 0  . amLODipine (NORVASC) 10 MG tablet Take 1 tablet (10 mg total) by mouth daily. 30 tablet 3  . dexlansoprazole (DEXILANT) 60 MG capsule Take 1 capsule (60 mg total) by mouth daily. 90 capsule 1  . valACYclovir (VALTREX) 500 MG tablet Take 1 tablet (500 mg total) by mouth daily. 90 tablet 1   No current facility-administered medications on file prior to visit.    Pulse 70  Temp(Src) 98.3 F (36.8 C) (Oral)  Resp 18  Ht 5\' 5"  (1.651 m)  Wt 216 lb 3.2 oz (98.068 kg)  BMI 35.98 kg/m2  SpO2 100%  LMP 02/08/2016    Objective:   Physical Exam  Constitutional: She is oriented to person, place, and time. She appears well-developed and well-nourished.  Cardiovascular: Normal rate, regular rhythm and normal heart sounds.   No murmur heard. Pulmonary/Chest: Effort normal and breath sounds normal. No respiratory distress. She has no wheezes.  Musculoskeletal: She exhibits no edema.  Neurological: She is alert and oriented to person, place, and time.  Psychiatric: She has a normal mood and affect. Her behavior is normal. Judgment and thought content normal.          Assessment & Plan:

## 2016-02-11 NOTE — Progress Notes (Signed)
Pre visit review using our clinic review tool, if applicable. No additional management support is needed unless otherwise documented below in the visit note. 

## 2016-02-11 NOTE — Assessment & Plan Note (Signed)
DBP is elevated.  Add low dose toprol xl. Continue amlodipine.

## 2016-02-11 NOTE — Patient Instructions (Signed)
Please continue amlodipine 10mg  once daily. Add Toprol xl once daily. Follow up in 2 weeks for nurse visit BP check.

## 2016-02-12 ENCOUNTER — Ambulatory Visit: Payer: 59 | Admitting: Family

## 2016-02-28 MED FILL — AMLODIPINE BESYLATE 10 MG T: 10 | 90 days supply | Qty: 90 | Fill #1

## 2016-03-10 ENCOUNTER — Ambulatory Visit (INDEPENDENT_AMBULATORY_CARE_PROVIDER_SITE_OTHER): Payer: 59 | Admitting: Family

## 2016-03-10 ENCOUNTER — Encounter: Payer: Self-pay | Admitting: Family

## 2016-03-10 ENCOUNTER — Ambulatory Visit: Payer: 59 | Admitting: Family

## 2016-03-10 DIAGNOSIS — I10 Essential (primary) hypertension: Secondary | ICD-10-CM

## 2016-03-10 DIAGNOSIS — S63042A Subluxation of carpometacarpal joint of left thumb, initial encounter: Secondary | ICD-10-CM | POA: Diagnosis not present

## 2016-03-10 DIAGNOSIS — M79642 Pain in left hand: Secondary | ICD-10-CM | POA: Diagnosis not present

## 2016-03-10 DIAGNOSIS — Z9889 Other specified postprocedural states: Secondary | ICD-10-CM | POA: Diagnosis not present

## 2016-03-10 NOTE — Progress Notes (Signed)
Pt could not wait to be seen today and r/s appt. States she was only supposed to be here for a nurse visit BP check. Pt r/s for nurse visit this week.

## 2016-03-21 ENCOUNTER — Ambulatory Visit: Payer: 59 | Admitting: *Deleted

## 2016-03-21 DIAGNOSIS — I10 Essential (primary) hypertension: Secondary | ICD-10-CM

## 2016-03-21 NOTE — Progress Notes (Signed)
Noted  

## 2016-03-21 NOTE — Progress Notes (Signed)
Pt walked out of office before being called back from waiting room. She did not reschedule appointment. Per front office staff, checked in 1:58 and left at 2:47 stating she will reschedule.

## 2016-04-10 ENCOUNTER — Ambulatory Visit (INDEPENDENT_AMBULATORY_CARE_PROVIDER_SITE_OTHER): Payer: 59 | Admitting: Medical

## 2016-04-10 VITALS — BP 132/88 | HR 61

## 2016-04-10 DIAGNOSIS — I10 Essential (primary) hypertension: Secondary | ICD-10-CM

## 2016-04-10 NOTE — Progress Notes (Signed)
Pre visit review using our clinic review tool, if applicable. No additional management support is needed unless otherwise documented below in the visit note.  02/11/16 AVS: Follow up in 2 weeks for nurse visit BP check.  Pt takes BP medications in the evening and reports no issues/side effects w/ current regimen.  Per Mackie Pai, PA-C: Continue current medications. Check BP at home 3x weekly and call the office if DBP is consistently >90. Follow-up w/ Melissa in 2-3 months.   Pt verbalized understanding of instructions and reports she is already checking BP 2-3x weekly.  Dorrene German, RN    After reviewing pt bp today and discussing pt bp history  Today I did give the above advise regarding bp treatment and plan. No change to her bp med regimen.  Saguier, Percell Miller, PA-C

## 2016-04-18 MED FILL — METOPROLOL SUCC ER 25 MG TA: 25 | 30 days supply | Qty: 30 | Fill #1

## 2016-04-30 DIAGNOSIS — Z124 Encounter for screening for malignant neoplasm of cervix: Secondary | ICD-10-CM | POA: Diagnosis not present

## 2016-04-30 DIAGNOSIS — Z113 Encounter for screening for infections with a predominantly sexual mode of transmission: Secondary | ICD-10-CM | POA: Diagnosis not present

## 2016-04-30 DIAGNOSIS — N951 Menopausal and female climacteric states: Secondary | ICD-10-CM | POA: Diagnosis not present

## 2016-04-30 DIAGNOSIS — Z01419 Encounter for gynecological examination (general) (routine) without abnormal findings: Secondary | ICD-10-CM | POA: Diagnosis not present

## 2016-04-30 DIAGNOSIS — N926 Irregular menstruation, unspecified: Secondary | ICD-10-CM | POA: Diagnosis not present

## 2016-05-09 DIAGNOSIS — N951 Menopausal and female climacteric states: Secondary | ICD-10-CM | POA: Diagnosis not present

## 2016-05-28 ENCOUNTER — Other Ambulatory Visit: Payer: Self-pay | Admitting: Family

## 2016-05-28 MED FILL — DEXILANT DR 60 MG CAPSULE: 60 | 90 days supply | Qty: 90 | Fill #1

## 2016-05-29 MED FILL — METOPROLOL SUCC ER 25 MG TA: 25 | 30 days supply | Qty: 30 | Fill #0

## 2016-06-20 MED FILL — AMOXICILLIN 500 MG CAPSULE: 500 | 10 days supply | Qty: 30 | Fill #0

## 2016-07-01 MED FILL — VALACYCLOVIR HCL 500 MG TAB: 500 | 90 days supply | Qty: 90 | Fill #1

## 2016-07-01 MED FILL — METOPROLOL SUCC ER 25 MG TA: 25 | 30 days supply | Qty: 30 | Fill #1

## 2016-07-04 ENCOUNTER — Telehealth: Payer: Self-pay | Admitting: *Deleted

## 2016-07-04 MED ORDER — AMLODIPINE BESYLATE 10 MG PO TABS: 10.0000 mg | ORAL_TABLET | Freq: Every day | ORAL | 0 refills | Status: DC

## 2016-07-04 MED FILL — AMLODIPINE BESYLATE 10 MG T: 10 | 30 days supply | Qty: 30 | Fill #0

## 2016-07-04 NOTE — Telephone Encounter (Signed)
Received fax from [facility] for amlodipine refill. Pt has follow up with PCP on 07/14/16. 30 day supply sent to pharmacy.

## 2016-07-07 ENCOUNTER — Ambulatory Visit: Payer: Self-pay | Admitting: Family

## 2016-07-11 ENCOUNTER — Ambulatory Visit: Payer: Self-pay | Admitting: Family

## 2016-07-14 ENCOUNTER — Ambulatory Visit: Payer: Self-pay | Admitting: Family

## 2016-07-14 ENCOUNTER — Telehealth: Payer: Self-pay

## 2016-07-14 NOTE — Telephone Encounter (Signed)
Patient called stated her child got sick and she had to get her from school she apologizes that she missed this appointment. She did reschedule for next Friday

## 2016-07-15 NOTE — Telephone Encounter (Signed)
Noted, no charge please.

## 2016-07-18 ENCOUNTER — Ambulatory Visit: Payer: Self-pay | Admitting: Family

## 2016-07-25 ENCOUNTER — Encounter: Payer: Self-pay | Admitting: Family

## 2016-07-25 ENCOUNTER — Telehealth: Payer: Self-pay | Admitting: Family

## 2016-07-25 ENCOUNTER — Ambulatory Visit (INDEPENDENT_AMBULATORY_CARE_PROVIDER_SITE_OTHER): Payer: 59 | Admitting: Family

## 2016-07-25 VITALS — BP 126/90 | HR 59 | Temp 98.7°F | Resp 16 | Ht 65.0 in | Wt 213.8 lb

## 2016-07-25 DIAGNOSIS — K219 Gastro-esophageal reflux disease without esophagitis: Secondary | ICD-10-CM | POA: Insufficient documentation

## 2016-07-25 DIAGNOSIS — Z23 Encounter for immunization: Secondary | ICD-10-CM | POA: Diagnosis not present

## 2016-07-25 DIAGNOSIS — R739 Hyperglycemia, unspecified: Secondary | ICD-10-CM | POA: Insufficient documentation

## 2016-07-25 DIAGNOSIS — I10 Essential (primary) hypertension: Secondary | ICD-10-CM

## 2016-07-25 DIAGNOSIS — R7303 Prediabetes: Secondary | ICD-10-CM | POA: Insufficient documentation

## 2016-07-25 LAB — BASIC METABOLIC PANEL
BUN: 19 mg/dL (ref 6–23)
CO2: 29 mEq/L (ref 19–32)
Calcium: 9 mg/dL (ref 8.4–10.5)
Chloride: 106 mEq/L (ref 96–112)
Creatinine, Ser: 0.91 mg/dL (ref 0.40–1.20)
GFR: 85.61 mL/min (ref >60.00)
Glucose, Bld: 97 mg/dL (ref 70–99)
Potassium: 3.9 mEq/L (ref 3.5–5.1)
Sodium: 140 mEq/L (ref 135–145)

## 2016-07-25 LAB — HEMOGLOBIN A1C: Hgb A1c MFr Bld: 5.4 % (ref 4.6–6.5)

## 2016-07-25 MED ORDER — DEXLANSOPRAZOLE 60 MG PO CPDR: 60.0000 mg | DELAYED_RELEASE_CAPSULE | Freq: Every day | ORAL | 1 refills | Status: DC

## 2016-07-25 MED ORDER — AMLODIPINE BESYLATE 10 MG PO TABS: 10.0000 mg | ORAL_TABLET | Freq: Every day | ORAL | 1 refills | Status: DC

## 2016-07-25 MED ORDER — VALACYCLOVIR HCL 500 MG PO TABS: 500.0000 mg | ORAL_TABLET | Freq: Every day | ORAL | 1 refills | Status: DC

## 2016-07-25 MED ORDER — METOPROLOL SUCCINATE ER 25 MG PO TB24: 25.0000 mg | ORAL_TABLET | Freq: Every day | ORAL | 1 refills | Status: DC

## 2016-07-25 NOTE — Progress Notes (Signed)
   Subjective:    Patient ID: Denise Austin, female    DOB: October 29, 1970, 45 y.o.   MRN: [id number]  HPI  [name] is a 45 yr old female who presents today for follow up of her hypertension.  She continues metoprolol and amlodipine. She denies CP/SOB or swelling.  BP Readings from Last 3 Encounters:  07/25/16 126/90  04/10/16 132/88  01/09/16 (!) 128/96   GERD- reports that her gerd symptoms remain well controlled on dexilant.   Hyperglycemia- last visit glucose was mildly elevated. A1C was performed.   Lab Results  Component Value Date   HGBA1C 5.7 12/26/2015    Review of Systems    see HPI  Past Medical History:  Diagnosis Date  . GERD (gastroesophageal reflux disease)   . Uterine fibroid      Social History   Social History  . Marital status: Single    Spouse name: N/A  . Number of children: N/A  . Years of education: N/A   Occupational History  . Not on file.   Social History Main Topics  . Smoking status: Never Smoker  . Smokeless tobacco: Not on file  . Alcohol use 0.0 oz/week     Comment: occasional  . Drug use: No  . Sexual activity: No   Other Topics Concern  . 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 BUNIONECTOMY    . CHOLECYSTECTOMY      Family History  Problem Relation Age of Onset  . Hypertension Mother   . Diabetes Mother   . Hypertension Father     smoker  . COPD Father   . Heart attack Neg Hx   . Hyperlipidemia Neg Hx   . Sudden death Neg Hx     Allergies  Allergen Reactions  . Oxycodone-Acetaminophen Nausea And Vomiting and Rash    No current outpatient prescriptions on file prior  to visit.   No current facility-administered medications on file prior to visit.     BP 126/90 (BP Location: Right Arm, Cuff Size: Large)   Pulse (!) 59   Temp 98.7 F (37.1 C) (Oral)   Resp 16   Ht 5\' 5"  (1.651 m)   Wt 213 lb 12.8 oz (97 kg)   LMP 06/30/2016   SpO2 99% Comment: room air  BMI 35.58 kg/m    Objective:   Physical Exam  Constitutional: She is oriented to person, place, and time. She appears well-developed and well-nourished.  HENT:  Head: Normocephalic and atraumatic.  Eyes: No scleral icterus.  Cardiovascular: Normal rate, regular rhythm and normal heart sounds.   No murmur heard. Pulmonary/Chest: Effort normal and breath sounds normal. No respiratory distress. She has no wheezes.  Musculoskeletal: She exhibits no edema.  Neurological: She is alert and oriented to person, place, and time.  Psychiatric: She has a normal mood and affect. Her behavior is normal. Judgment and thought content normal.          Assessment & Plan:

## 2016-07-25 NOTE — Assessment & Plan Note (Signed)
BP is stable on current medications. Continue same.  

## 2016-07-25 NOTE — Assessment & Plan Note (Signed)
Clinically stable.  Obtain a1c.   

## 2016-07-25 NOTE — Telephone Encounter (Signed)
FYI- AVS states 6 mth cpe. Pt says that she will call back to schedule.

## 2016-07-25 NOTE — Patient Instructions (Signed)
Please complete lab work prior to leaving.   

## 2016-07-25 NOTE — Progress Notes (Signed)
Pre visit review using our clinic review tool, if applicable. No additional management support is needed unless otherwise documented below in the visit note. 

## 2016-07-25 NOTE — Assessment & Plan Note (Signed)
Stable on PPI. Continue same.  

## 2016-08-06 MED FILL — AMLODIPINE BESYLATE 10 MG T: 10 | 90 days supply | Qty: 90 | Fill #0

## 2016-08-06 MED FILL — METOPROLOL SUCC ER 25 MG TA: 25 | 90 days supply | Qty: 90 | Fill #0

## 2016-09-16 MED FILL — DEXILANT DR 60 MG CAPSULE: 60 | 90 days supply | Qty: 90 | Fill #0 | Status: TO

## 2016-10-09 ENCOUNTER — Encounter (INDEPENDENT_AMBULATORY_CARE_PROVIDER_SITE_OTHER): Payer: Self-pay | Admitting: Family Medicine

## 2016-10-29 ENCOUNTER — Ambulatory Visit (INDEPENDENT_AMBULATORY_CARE_PROVIDER_SITE_OTHER): Payer: 59 | Admitting: Family

## 2016-10-29 ENCOUNTER — Ambulatory Visit (INDEPENDENT_AMBULATORY_CARE_PROVIDER_SITE_OTHER): Payer: Self-pay

## 2016-10-29 DIAGNOSIS — M542 Cervicalgia: Secondary | ICD-10-CM | POA: Diagnosis not present

## 2016-10-29 DIAGNOSIS — M436 Torticollis: Secondary | ICD-10-CM

## 2016-10-29 MED ORDER — METHOCARBAMOL 500 MG PO TABS: 500.0000 mg | ORAL_TABLET | Freq: Four times a day (QID) | ORAL | 0 refills | Status: DC

## 2016-10-29 MED FILL — METHOCARBAMOL 500 MG TABLET: 500 | 11 days supply | Qty: 45 | Fill #0

## 2016-10-29 NOTE — Progress Notes (Signed)
Office Visit Note   Patient: Denise Austin           Date of Birth: Aug 05, 1971           MRN: [id number] Visit Date: 10/29/2016              Requested by: Debbrah Alar, NP [address] [place], [place] [address] PCP: Nance Pear., NP   Assessment & Plan: Visit Diagnoses:  1. Torticollis, acute   2. Cervical spine pain     Plan: Encouraged him continuing use of a heat pack. Have called in a prescription for Robaxin follow-up in the office as needed  Follow-Up Instructions: Return if symptoms worsen or fail to improve.   Orders:  Orders Placed This Encounter  Procedures  . XR Cervical Spine 2 or 3 views   Meds ordered this encounter  Medications  . methocarbamol (ROBAXIN) 500 MG tablet    Sig: Take 1 tablet (500 mg total) by mouth 4 (four) times daily.    Dispense:  45 tablet    Refill:  0      Procedures: No procedures performed   Clinical Data: No additional findings.   Subjective: Chief Complaint  Patient presents with  . Spine - Pain    c spine pain x 5 days NKI    Since 46 year old woman who is seen today for evaluation of acute neck pain. This been going on for approximately 5 days. No known injury. States awoke with shooting pain, constant aching. Pain is aggravated by movement. Has been using heat with good relief. Did take 1 muscle relaxer last night and states this worked well. Has been taking ibuprofen without relief.    Review of Systems  Constitutional: Negative for chills and fever.  Musculoskeletal: Positive for neck pain.     Objective: Vital Signs: There were no vitals taken for this visit.  Physical Exam  Constitutional: She is oriented to person, place, and time. She appears well-developed and well-nourished.  Neck: Neck supple. Muscular tenderness present. No spinous process tenderness present. Decreased range of motion present. No edema present.  Pulmonary/Chest: Effort normal.  Neurological:  She is alert and oriented to person, place, and time.  Psychiatric: She has a normal mood and affect.  Nursing note reviewed.   Ortho Exam  Specialty Comments:  No specialty comments available.  Imaging: No results found.   PMFS History: Patient Active Problem List   Diagnosis Date Noted  . GERD (gastroesophageal reflux disease) 07/25/2016  . Hyperglycemia 07/25/2016  . HTN (hypertension) 12/24/2015  . Preventative health care 10/16/2014   Past Medical History:  Diagnosis Date  . GERD (gastroesophageal reflux disease)   . Uterine fibroid     Family History  Problem Relation Age of Onset  . Hypertension Mother   . Diabetes Mother   . Hypertension Father     smoker  . COPD Father   . Heart attack Neg Hx   . Hyperlipidemia Neg Hx   . Sudden death Neg Hx     Past Surgical History:  Procedure Laterality Date  . BUNIONECTOMY    . CHOLECYSTECTOMY     Social History   Occupational History  . Not on file.   Social History Main Topics  . Smoking status: Never Smoker  . Smokeless tobacco: Not on file  . Alcohol use 0.0 oz/week     Comment: occasional  . Drug use: No  . Sexual activity: No

## 2016-11-18 MED FILL — AMLODIPINE BESYLATE 10 MG T: 10 | 90 days supply | Qty: 90 | Fill #1

## 2016-11-18 MED FILL — METOPROLOL SUCC ER 25 MG TA: 25 | 90 days supply | Qty: 90 | Fill #1

## 2016-12-01 ENCOUNTER — Other Ambulatory Visit (INDEPENDENT_AMBULATORY_CARE_PROVIDER_SITE_OTHER): Payer: Self-pay

## 2016-12-01 MED ORDER — METHYLPREDNISOLONE 4 MG PO TABS: ORAL_TABLET | ORAL | 0 refills | Status: DC

## 2016-12-01 MED FILL — METHYLPREDNISOLONE 4 MG TAB: 4 | 6 days supply | Qty: 21 | Fill #0

## 2016-12-04 ENCOUNTER — Ambulatory Visit (INDEPENDENT_AMBULATORY_CARE_PROVIDER_SITE_OTHER): Payer: Self-pay

## 2016-12-04 ENCOUNTER — Ambulatory Visit (INDEPENDENT_AMBULATORY_CARE_PROVIDER_SITE_OTHER): Payer: 59 | Admitting: Surgery

## 2016-12-04 DIAGNOSIS — M5441 Lumbago with sciatica, right side: Secondary | ICD-10-CM

## 2016-12-04 MED FILL — PREVIDENT 5000 SENSITIVE PA: 1.1-5 | 30 days supply | Qty: 100 | Fill #0

## 2016-12-08 ENCOUNTER — Encounter (INDEPENDENT_AMBULATORY_CARE_PROVIDER_SITE_OTHER): Payer: Self-pay | Admitting: Surgery

## 2016-12-08 ENCOUNTER — Other Ambulatory Visit (INDEPENDENT_AMBULATORY_CARE_PROVIDER_SITE_OTHER): Payer: Self-pay

## 2016-12-08 DIAGNOSIS — M5441 Lumbago with sciatica, right side: Secondary | ICD-10-CM

## 2016-12-08 NOTE — Progress Notes (Signed)
46 year old black female was seen by me today briefly for follow-up of low back pain and right lower extremity radiculopathy. Patient had off-and-on pain in her low back for over a year but this was worse 11/29/2016. That day she began having increased low back pain with radiation into her right buttock and lateral hip and her thigh. Nothing going below the knee. No symptoms on the left side. She was prescribed a prednisone taper on 12/01/2016 that did not give her much improvement. She was also taking Robaxin when necessary for spasms. On 12/04/2016 I ordered lumbar spine x-rays and those showed that the disc spaces are fairly well maintained for patient's age. Maybe a couple millimeters of L2 retrolisthesis on L3. There does appear to be some motion with flexion and extension. I ordered a Toradol 60 mg IM injection that day and patient had some improvement of her leg pain.  Patient was also given some home back exercises without any relief.  Exam: Gait is antalgic. Pleasant black female alert and oriented 3 and in no acute distress. No respiratory distress. She has right lumbar paraspinal tenderness. Mildly tender over the right hip greater trochanter bursa. Positive right straight leg raise. Negative on the left side. No focal motor deficits. Neurovascularly intact. Skin warm and dry. Bilateral calves nontender.   Assessment Worsening low back pain and right lower extremity radiculopathy. Failed conservative treatment. Question right L2-3 H&P. Slight L2 retrolisthesis seen on x-ray.  Plan: Since patient has failed conservative treatment up to this point and continues to have ongoing worsening symptoms we'll schedule lumbar spine MRI to rule out HNP/stenosis. Follow-up the office after completion to discuss results and further treatment options. All questions answered. Can continue using over-the-counter anti-inflammatory and Robaxin as directed.

## 2016-12-09 ENCOUNTER — Ambulatory Visit
Admission: RE | Admit: 2016-12-09 | Discharge: 2016-12-09 | Disposition: A | Discharge status: Home / Self Care | Payer: 59 | Source: Ambulatory Visit | Attending: Surgery | Admitting: Surgery

## 2016-12-09 DIAGNOSIS — M5441 Lumbago with sciatica, right side: Secondary | ICD-10-CM

## 2016-12-09 DIAGNOSIS — M5126 Other intervertebral disc displacement, lumbar region: Secondary | ICD-10-CM | POA: Diagnosis not present

## 2016-12-10 ENCOUNTER — Encounter (INDEPENDENT_AMBULATORY_CARE_PROVIDER_SITE_OTHER): Payer: Self-pay | Admitting: Physical Medicine and Rehabilitation

## 2016-12-10 ENCOUNTER — Ambulatory Visit (INDEPENDENT_AMBULATORY_CARE_PROVIDER_SITE_OTHER): Payer: Self-pay

## 2016-12-10 ENCOUNTER — Other Ambulatory Visit (INDEPENDENT_AMBULATORY_CARE_PROVIDER_SITE_OTHER): Payer: Self-pay

## 2016-12-10 ENCOUNTER — Ambulatory Visit (INDEPENDENT_AMBULATORY_CARE_PROVIDER_SITE_OTHER): Payer: 59 | Admitting: Physical Medicine and Rehabilitation

## 2016-12-10 VITALS — BP 137/87 | HR 71 | Temp 98.4°F

## 2016-12-10 DIAGNOSIS — M5416 Radiculopathy, lumbar region: Secondary | ICD-10-CM | POA: Diagnosis not present

## 2016-12-10 DIAGNOSIS — M5116 Intervertebral disc disorders with radiculopathy, lumbar region: Secondary | ICD-10-CM

## 2016-12-10 DIAGNOSIS — M545 Low back pain: Secondary | ICD-10-CM

## 2016-12-10 MED ORDER — BUPIVACAINE HCL 0.5 % IJ SOLN
50.0000 mL | Freq: Once | INTRAMUSCULAR | Status: AC
  Administered 2016-12-10: 50 mL

## 2016-12-10 MED ORDER — LIDOCAINE HCL (PF) 1 % IJ SOLN
0.3300 mL | Freq: Once | INTRAMUSCULAR | Status: AC
  Administered 2016-12-10: 0.3 mL

## 2016-12-10 MED ORDER — METHYLPREDNISOLONE ACETATE 80 MG/ML IJ SUSP
80.0000 mg | Freq: Once | INTRAMUSCULAR | Status: AC
  Administered 2016-12-10: 80 mg

## 2016-12-10 MED ORDER — DEXAMETHASONE SODIUM PHOSPHATE 10 MG/ML IJ SOLN
15.0000 mg | Freq: Once | INTRAMUSCULAR | Status: AC
  Administered 2016-12-10: 15 mg

## 2016-12-10 NOTE — Progress Notes (Signed)
SHENELL LEITH - 46 y.o. female MRN [id number]  Date of birth: 07-Jul-1971  Office Visit Note: Visit Date: 12/10/2016 PCP: Nance Pear., NP Referred by: Debbrah Alar, NP  Subjective: Chief Complaint  Patient presents with  . 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 Persistent pain around 1 week and half. Constant. Worse with sitting long periods and laying. Some better with walking and standing. Pain and numbness in right thigh. Took medrol dose pack with no relief. She's been followed by Dr. Louanne Skye and Benjiman Core, PA. MRI of the lumbar spine was performed and is actually reviewed below. She does have small foraminal protrusion at L2 that may be the cause of some thigh numbness. Alternatively, consideration should be given to meralgia paresthetica.    ROS Otherwise per HPI.  Assessment & Plan: Visit Diagnoses:  1. Lumbar radiculopathy   2. Radiculopathy due to lumbar intervertebral disc disorder     Plan: Findings:  Right L2 transforaminal epidural steroid injection.    Meds & Orders:  Meds ordered this encounter  Medications  . lidocaine (PF) (XYLOCAINE) 1 % injection 0.3 mL  . bupivacaine (MARCAINE) 0.5 % (with pres) injection 50 mL  . methylPREDNISolone acetate (DEPO-MEDROL) injection 80 mg  . dexamethasone (DECADRON) injection 15 mg    Orders Placed This Encounter  Procedures  . XR C-ARM NO REPORT  . Epidural Steroid injection    Follow-up: Return if symptoms worsen or fail to improve, for Benjiman Core, [place].   Procedures: No procedures performed  Lumbosacral Transforaminal Epidural Steroid Injection - Infraneural Approach with Fluoroscopic Guidance  Patient: NAVIL OLAGUEZ      Date of Birth: [birth year] MRN: [id number] PCP: Nance Pear., NP      Visit Date: 12/10/2016   Universal Protocol:    Date/Time:  02/15/181:05 PM  Consent Given By: the patient  Position: PRONE   Additional Comments: Vital signs were monitored before and after the procedure. Patient was prepped and draped in the usual sterile fashion. The correct patient, procedure, and site was verified.   Injection Procedure Details:  Procedure Site One Meds Administered:  Meds ordered this encounter  Medications  . lidocaine (PF) (XYLOCAINE) 1 % injection 0.3 mL  . bupivacaine (MARCAINE) 0.5 % (with pres) injection 50 mL  . methylPREDNISolone acetate (DEPO-MEDROL) injection 80 mg  . dexamethasone (DECADRON) injection 15 mg     Please note that dexamethasone was the only steroid given. I was unable to take out the administered medication listing of methylprednisolone. Laterality: Right  Location/Site:  L2-L3  Needle size: 22 G  Needle type: Spinal  Needle Placement: Transforaminal  Findings:  -Contrast Used: 1 mL iohexol 180 mg iodine/mL   -Comments: Excellent flow of contrast along the nerve and into the epidural space.  Procedure Details: After squaring off the end-plates of the desired vertebral level to get a true AP view, the C-arm was obliqued to the painful side so that the superior articulating process is positioned about 1/3 the length of the inferior endplate.  The needle was aimed toward the junction of the superior articular process and the transverse process of the inferior vertebrae. The needle's initial entry is in the lower third of the foramen through Kambin's triangle. The soft tissues overlying this target were infiltrated with 2-3 ml. of 1% Lidocaine without Epinephrine.  The spinal needle  was then inserted and advanced toward the target using a "trajectory" view along the fluoroscope beam.  Under AP and lateral visualization, the needle was advanced so it did not puncture dura and did not traverse medially beyond the 6 o'clock position of the pedicle. Bi-planar projections were used to confirm  position. Aspiration was confirmed to be negative for CSF and/or blood. A 1-2 ml. volume of Isovue-250 was injected and flow of contrast was noted at each level. Radiographs were obtained for documentation purposes.   After attaining the desired flow of contrast documented above, a 0.5 to 1.0 ml test dose of 0.25% Marcaine was injected into each respective transforaminal space.  The patient was observed for 90 seconds post injection.  After no sensory deficits were reported, and normal lower extremity motor function was noted,   the above injectate was administered so that equal amounts of the injectate were placed at each foramen (level) into the transforaminal epidural space.   Additional Comments:  The patient tolerated the procedure well Dressing: Band-Aid    Post-procedure details: Patient was observed during the procedure. Post-procedure instructions were reviewed.  Patient left the clinic in stable condition.   Clinical History: IMPRESSION: Disc protrusion just beyond the right foramen at L2-3 contacts the exited right L2 root. The root does not appear compressed or displaced.  Moderate facet arthropathy and a shallow disc bulge at L4-5 where there is mild central canal narrowing.  Small annular fissure and shallow central protrusion L5-S1 without central canal or foraminal stenosis.   Electronically Signed   By: Inge Rise M.D.   On: 12/09/2016 16:09   She reports that she has never smoked. She has never used smokeless tobacco.   Recent Labs  12/26/15 1107 07/25/16 0831  HGBA1C 5.7 5.4    Objective:  VS:  HT:    WT:   BMI:     BP:137/87  HR:71bpm  TEMP:98.4 F (36.9 C)( )  RESP:100 % Physical Exam  Musculoskeletal:  Patient ambulates with out aid with normal gait. No foot drop.    Ortho Exam Imaging: Xr C-arm No Report  Result Date: 12/10/2016 Please see Notes or Procedures tab for imaging impression.   Past  Medical/Family/Surgical/Social History: Medications & Allergies reviewed per EMR Patient Active Problem List   Diagnosis Date Noted  . GERD (gastroesophageal reflux disease) 07/25/2016  . Hyperglycemia 07/25/2016  . HTN (hypertension) 12/24/2015  . Preventative health care 10/16/2014   Past Medical History:  Diagnosis Date  . GERD (gastroesophageal reflux disease)   . Uterine fibroid    Family History  Problem Relation Age of Onset  . Hypertension Mother   . Diabetes Mother   . Hypertension Father     smoker  . COPD Father   . Heart attack Neg Hx   . Hyperlipidemia Neg Hx   . Sudden death Neg Hx    Past Surgical History:  Procedure Laterality Date  . BUNIONECTOMY    . CHOLECYSTECTOMY     Social History   Occupational History  . Not on file.   Social History Main Topics  . Smoking status: Never Smoker  . Smokeless tobacco: Never Used  . Alcohol use 0.0 oz/week     Comment: occasional  . Drug use: No  . Sexual activity: No

## 2016-12-10 NOTE — Patient Instructions (Signed)

## 2016-12-11 NOTE — Procedures (Signed)
Lumbosacral Transforaminal Epidural Steroid Injection - Infraneural Approach with Fluoroscopic Guidance  Patient: Denise Austin      Date of Birth: 07/13/1971 MRN: [id number] PCP: Nance Pear., NP      Visit Date: 12/10/2016   Universal Protocol:    Date/Time: 02/15/181:05 PM  Consent Given By: the patient  Position: PRONE   Additional Comments: Vital signs were monitored before and after the procedure. Patient was prepped and draped in the usual sterile fashion. The correct patient, procedure, and site was verified.   Injection Procedure Details:  Procedure Site One Meds Administered:  Meds ordered this encounter  Medications  . lidocaine (PF) (XYLOCAINE) 1 % injection 0.3 mL  . bupivacaine (MARCAINE) 0.5 % (with pres) injection 50 mL  . methylPREDNISolone acetate (DEPO-MEDROL) injection 80 mg  . dexamethasone (DECADRON) injection 15 mg     Please note that dexamethasone was the only steroid given. I was unable to take out the administered medication listing of methylprednisolone. Laterality: Right  Location/Site:  L2-L3  Needle size: 22 G  Needle type: Spinal  Needle Placement: Transforaminal  Findings:  -Contrast Used: 1 mL iohexol 180 mg iodine/mL   -Comments: Excellent flow of contrast along the nerve and into the epidural space.  Procedure Details: After squaring off the end-plates of the desired vertebral level to get a true AP view, the C-arm was obliqued to the painful side so that the superior articulating process is positioned about 1/3 the length of the inferior endplate.  The needle was aimed toward the junction of the superior articular process and the transverse process of the inferior vertebrae. The needle's initial entry is in the lower third of the foramen through Kambin's triangle. The soft tissues overlying this target were infiltrated with 2-3 ml. of 1% Lidocaine without Epinephrine.  The spinal needle was then inserted and advanced  toward the target using a "trajectory" view along the fluoroscope beam.  Under AP and lateral visualization, the needle was advanced so it did not puncture dura and did not traverse medially beyond the 6 o'clock position of the pedicle. Bi-planar projections were used to confirm position. Aspiration was confirmed to be negative for CSF and/or blood. A 1-2 ml. volume of Isovue-250 was injected and flow of contrast was noted at each level. Radiographs were obtained for documentation purposes.   After attaining the desired flow of contrast documented above, a 0.5 to 1.0 ml test dose of 0.25% Marcaine was injected into each respective transforaminal space.  The patient was observed for 90 seconds post injection.  After no sensory deficits were reported, and normal lower extremity motor function was noted,   the above injectate was administered so that equal amounts of the injectate were placed at each foramen (level) into the transforaminal epidural space.   Additional Comments:  The patient tolerated the procedure well Dressing: Band-Aid    Post-procedure details: Patient was observed during the procedure. Post-procedure instructions were reviewed.  Patient left the clinic in stable condition.

## 2017-01-12 ENCOUNTER — Other Ambulatory Visit: Payer: Self-pay | Admitting: Family

## 2017-01-12 DIAGNOSIS — Z1231 Encounter for screening mammogram for malignant neoplasm of breast: Secondary | ICD-10-CM

## 2017-01-16 ENCOUNTER — Encounter: Payer: Self-pay | Admitting: Family

## 2017-01-16 ENCOUNTER — Ambulatory Visit (INDEPENDENT_AMBULATORY_CARE_PROVIDER_SITE_OTHER): Payer: 59 | Admitting: Family

## 2017-01-16 VITALS — BP 137/95 | HR 78 | Temp 98.8°F | Resp 16 | Ht 64.5 in | Wt 222.4 lb

## 2017-01-16 DIAGNOSIS — Z0000 Encounter for general adult medical examination without abnormal findings: Secondary | ICD-10-CM

## 2017-01-16 LAB — URINALYSIS, ROUTINE W REFLEX MICROSCOPIC
Bilirubin Urine: NEGATIVE
Hgb urine dipstick: NEGATIVE
Ketones, ur: NEGATIVE
Leukocytes, UA: NEGATIVE
Nitrite: NEGATIVE
Specific Gravity, Urine: >=1.03 — AB (ref 1.000–1.030)
Total Protein, Urine: NEGATIVE
Urine Glucose: NEGATIVE
Urobilinogen, UA: 0.2 (ref 0.0–1.0)
pH: 5.5 (ref 5.0–8.0)

## 2017-01-16 LAB — CBC WITH DIFFERENTIAL/PLATELET
Basophils Absolute: 0 10*3/uL (ref 0.0–0.1)
Basophils Relative: 0.2 % (ref 0.0–3.0)
Eosinophils Absolute: 0.1 10*3/uL (ref 0.0–0.7)
Eosinophils Relative: 1 % (ref 0.0–5.0)
HCT: 40.1 % (ref 36.0–46.0)
Hemoglobin: 13.3 g/dL (ref 12.0–15.0)
Lymphocytes Relative: 23.5 % (ref 12.0–46.0)
Lymphs Abs: 1.7 10*3/uL (ref 0.7–4.0)
MCHC: 33.2 g/dL (ref 30.0–36.0)
MCV: 95.1 fl (ref 78.0–100.0)
Monocytes Absolute: 0.6 10*3/uL (ref 0.1–1.0)
Monocytes Relative: 7.7 % (ref 3.0–12.0)
Neutro Abs: 4.9 10*3/uL (ref 1.4–7.7)
Neutrophils Relative %: 67.6 % (ref 43.0–77.0)
Platelets: 280 10*3/uL (ref 150.0–400.0)
RBC: 4.21 Mil/uL (ref 3.87–5.11)
RDW: 13.2 % (ref 11.5–15.5)
WBC: 7.3 10*3/uL (ref 4.0–10.5)

## 2017-01-16 LAB — HEPATIC FUNCTION PANEL
ALT: 12 U/L (ref 0–35)
AST: 15 U/L (ref 0–37)
Albumin: 4 g/dL (ref 3.5–5.2)
Alkaline Phosphatase: 64 U/L (ref 39–117)
Bilirubin, Direct: 0.1 mg/dL (ref 0.0–0.3)
Total Bilirubin: 0.6 mg/dL (ref 0.2–1.2)
Total Protein: 6.8 g/dL (ref 6.0–8.3)

## 2017-01-16 LAB — LIPID PANEL
Cholesterol: 125 mg/dL (ref 0–200)
HDL: 52.6 mg/dL (ref >39.00)
LDL Cholesterol: 61 mg/dL (ref 0–99)
NonHDL: 72.7
Total CHOL/HDL Ratio: 2
Triglycerides: 58 mg/dL (ref 0.0–149.0)
VLDL: 11.6 mg/dL (ref 0.0–40.0)

## 2017-01-16 LAB — BASIC METABOLIC PANEL
BUN: 12 mg/dL (ref 6–23)
CO2: 27 mEq/L (ref 19–32)
Calcium: 9.5 mg/dL (ref 8.4–10.5)
Chloride: 105 mEq/L (ref 96–112)
Creatinine, Ser: 0.81 mg/dL (ref 0.40–1.20)
GFR: 97.71 mL/min (ref >60.00)
Glucose, Bld: 112 mg/dL — ABNORMAL HIGH (ref 70–99)
Potassium: 3.8 mEq/L (ref 3.5–5.1)
Sodium: 139 mEq/L (ref 135–145)

## 2017-01-16 LAB — TSH: TSH: 1.37 u[IU]/mL (ref 0.35–4.50)

## 2017-01-16 LAB — HEMOGLOBIN A1C: Hgb A1c MFr Bld: 5.7 % (ref 4.6–6.5)

## 2017-01-16 MED ORDER — METOPROLOL SUCCINATE ER 25 MG PO TB24: 25.0000 mg | ORAL_TABLET | Freq: Every day | ORAL | 1 refills | Status: DC

## 2017-01-16 MED ORDER — VALACYCLOVIR HCL 500 MG PO TABS: 500.0000 mg | ORAL_TABLET | Freq: Every day | ORAL | 1 refills | Status: DC

## 2017-01-16 MED ORDER — AMLODIPINE BESYLATE 10 MG PO TABS: 10.0000 mg | ORAL_TABLET | Freq: Every day | ORAL | 1 refills | Status: DC

## 2017-01-16 MED FILL — VALACYCLOVIR HCL 500 MG TAB: 500 | 90 days supply | Qty: 90 | Fill #0

## 2017-01-16 MED FILL — DEXILANT DR 60 MG CAPSULE: 60 | 30 days supply | Qty: 30 | Fill #0

## 2017-01-16 MED FILL — PREVIDENT 5000 SENSITIVE PA: 1.1-5 | 30 days supply | Qty: 100 | Fill #1

## 2017-01-16 NOTE — Patient Instructions (Signed)
Please complete lab work prior to leaving. Work on [employer], exercise and weight loss.  Follow up in 6 months.

## 2017-01-16 NOTE — Progress Notes (Signed)
Subjective:    Patient ID: Denise Austin, female    DOB: 10/24/1971, 46 y.o.   MRN: [id number]  HPI Patient presents today for complete physical.  Immunizations: tetanus up to date,  Flu shot up to date.  Diet: not eating healthy Wt Readings from Last 3 Encounters:  07/25/16 213 lb 12.8 oz (97 kg)  02/11/16 216 lb 3.2 oz (98.1 kg)  01/09/16 215 lb 12.8 oz (97.9 kg)  Exercise: walks on her lunch break Pap Smear:  7/17- normal per patient Mammogram:  scheduled for 4/21     Review of Systems  Constitutional: Negative for unexpected weight change.  HENT: Negative for hearing loss and rhinorrhea.   Eyes: Negative for visual disturbance.  Respiratory: Negative for cough.   Cardiovascular: Negative for leg swelling.  Gastrointestinal: Negative for constipation and diarrhea.  Genitourinary: Negative for dysuria and frequency.  Musculoskeletal: Positive for back pain. Negative for arthralgias and myalgias.  Skin: Negative for rash.  Neurological: Negative for headaches.  Hematological: Negative for adenopathy.  Psychiatric/Behavioral:       Denies depression/anxiety   Past Medical History:  Diagnosis Date  . GERD (gastroesophageal reflux disease)   . Uterine fibroid      Social History   Social History  . Marital status: Single    Spouse name: N/A  . Number of children: N/A  . Years of education: N/A   Occupational History  . Not on file.   Social History Main Topics  . Smoking status: Never Smoker  . Smokeless tobacco: Never Used  . Alcohol use 0.0 oz/week     Comment: occasional  . Drug use: No  . Sexual activity: No   Other Topics Concern  . 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 BUNIONECTOMY    . CHOLECYSTECTOMY      Family History  Problem Relation Age of Onset  . Hypertension Mother   . Diabetes Mother   . Hypertension Father     smoker  . COPD Father   . Heart attack Neg Hx   . Hyperlipidemia Neg Hx   . Sudden death Neg Hx     Allergies  Allergen Reactions  . Oxycodone-Acetaminophen Nausea And Vomiting and Rash    Current Outpatient Prescriptions on File Prior to Visit  Medication Sig Dispense Refill  . amLODipine (NORVASC) 10 MG tablet Take 1 tablet (10 mg total) by mouth daily. 90 tablet 1  . dexlansoprazole (DEXILANT) 60 MG capsule Take 1 capsule (60 mg total) by mouth daily. 90 capsule 1  . metoprolol succinate (TOPROL-XL) 25 MG 24 hr tablet Take 1 tablet (25 mg total) by mouth daily. 90 tablet 1  . valACYclovir (VALTREX) 500 MG tablet Take 1 tablet (500 mg total) by mouth daily. 90 tablet 1   No current facility-administered medications on file prior to visit.     There were no vitals taken for this visit.      Objective:   Physical Exam Physical Exam  Constitutional: She is oriented to person, place, and time. She appears well-developed and  well-nourished. No distress.  HENT:  Head: Normocephalic and atraumatic.  Right Ear: Tympanic membrane and ear canal normal.  Left Ear: Tympanic membrane and ear canal normal.  Mouth/Throat: Oropharynx is clear and moist.  Eyes: Pupils are equal, round, and reactive to light. No scleral icterus.  Neck: Normal range of motion. No thyromegaly present.  Cardiovascular: Normal rate and regular rhythm.   No murmur heard. Pulmonary/Chest: Effort normal and breath sounds normal. No respiratory distress. He has no wheezes. She has no rales. She exhibits no tenderness.  Abdominal: Soft. Bowel sounds are normal. She exhibits no distension and no mass. There is no tenderness. There is no rebound and no guarding.  Musculoskeletal: She  exhibits no edema.  Lymphadenopathy:    She has no cervical adenopathy.  Neurological: She is alert and oriented to person, place, and time. She has normal patellar reflexes. She exhibits normal muscle tone. Coordination normal.  Skin: Skin is warm and dry.  Psychiatric: She has a normal mood and affect. Her behavior is normal. Judgment and thought content normal.  Breasts: Examined lying Right: Without masses, retractions, discharge or axillary adenopathy.  Left: Without masses, retractions, discharge or axillary adenopathy.          Assessment & Plan:          Assessment & Plan:  Preventative Care- discussed healthy diet, exercise, weight loss. Obtain routine lab work. EKG tracing is personally reviewed.  EKG notes NSR.  No acute changes. Pap up to date, mammo scheduled.  Immunizations reviewed and up to date.  Elevated blood pressure- advised pt to follow up in 2 weeks for nurse visit BP check. She reports that she went out of town and missed a few doses of her bp meds.

## 2017-01-17 LAB — HIV ANTIBODY (ROUTINE TESTING W REFLEX): HIV 1&2 Ab, 4th Generation: NONREACTIVE

## 2017-01-18 ENCOUNTER — Encounter: Payer: Self-pay | Admitting: Family

## 2017-01-26 ENCOUNTER — Encounter: Payer: Self-pay | Admitting: Family

## 2017-01-28 ENCOUNTER — Telehealth (INDEPENDENT_AMBULATORY_CARE_PROVIDER_SITE_OTHER): Payer: Self-pay | Admitting: Radiology

## 2017-01-28 MED ORDER — TRAMADOL HCL 50 MG PO TABS: 50.0000 mg | ORAL_TABLET | Freq: Four times a day (QID) | ORAL | 0 refills | Status: DC | PRN

## 2017-01-28 MED FILL — traMADol HCL 50 MG TABS: 50 | 13 days supply | Qty: 50 | Fill #0

## 2017-01-28 NOTE — Telephone Encounter (Signed)
Patient is having increased back and leg pain. The injection did okay, but is wearing off. She is unable to sit or stand for long periods of time. Please advise.

## 2017-01-28 NOTE — Telephone Encounter (Signed)
Per Benjiman Core, PA-C- can call in Tramadol 1 po q 6 hours prn pain #50 with no refills. Patient needs to make appointment with physician to decide next option.  Called to [facility].

## 2017-01-28 NOTE — Telephone Encounter (Signed)
Script called to pharmacy. Patient advised. She is going to make appt with physician.

## 2017-01-29 NOTE — Telephone Encounter (Signed)
Ok. Thanks!

## 2017-01-30 ENCOUNTER — Encounter: Payer: Self-pay | Admitting: Family

## 2017-02-05 ENCOUNTER — Telehealth (INDEPENDENT_AMBULATORY_CARE_PROVIDER_SITE_OTHER): Payer: Self-pay | Admitting: Physical Medicine and Rehabilitation

## 2017-02-05 NOTE — Telephone Encounter (Signed)
PATIENT CALLED REQUESTING COPY OF LUMBAR MRI REPORT. I VERIFIED INFORMATION/PRINTED REPORT FOR PATIENT TO PICKUP.

## 2017-02-09 DIAGNOSIS — I10 Essential (primary) hypertension: Secondary | ICD-10-CM | POA: Diagnosis not present

## 2017-02-09 DIAGNOSIS — M5126 Other intervertebral disc displacement, lumbar region: Secondary | ICD-10-CM | POA: Diagnosis not present

## 2017-02-09 DIAGNOSIS — Z6837 Body mass index (BMI) 37.0-37.9, adult: Secondary | ICD-10-CM | POA: Diagnosis not present

## 2017-02-09 MED FILL — MELOXICAM 7.5 MG TABLET: 7.5 | 30 days supply | Qty: 60 | Fill #0

## 2017-02-10 MED FILL — HYDROCODON-APAP 5-325: 5-325 | 10 days supply | Qty: 30 | Fill #0

## 2017-02-14 ENCOUNTER — Ambulatory Visit (HOSPITAL_BASED_OUTPATIENT_CLINIC_OR_DEPARTMENT_OTHER)
Admission: RE | Admit: 2017-02-14 | Discharge: 2017-02-14 | Disposition: A | Discharge status: Home / Self Care | Payer: 59 | Source: Ambulatory Visit | Attending: Family | Admitting: Family

## 2017-02-14 DIAGNOSIS — Z1231 Encounter for screening mammogram for malignant neoplasm of breast: Secondary | ICD-10-CM | POA: Insufficient documentation

## 2017-02-26 ENCOUNTER — Telehealth: Payer: Self-pay | Admitting: Family

## 2017-02-26 MED FILL — DEXILANT DR 60 MG CAPSULE: 60 | 30 days supply | Qty: 30 | Fill #1

## 2017-02-26 NOTE — Telephone Encounter (Signed)
Caller name: Relationship to patient: Self Can be reached: [phone]  Pharmacy: [facility], [place]  Reason for call: Request refill on dexlansoprazole (DEXILANT) 60 MG capsule [[id number] Patient states she was informed that she needs a PA before she can get a refill. Plse adv

## 2017-03-02 NOTE — Telephone Encounter (Signed)
Attempted to reach pt and left detailed message to call or email if she has ever tried any other medications for GERD / reflux and if so what has she tried and were they effective in controlling her symptoms.

## 2017-03-03 NOTE — Telephone Encounter (Signed)
Pt returned call. She says that she use to take Nexium about 5 or 6 years ago and was switched because she took it so much. She says that she went ahead and paid out of pocket for Dexilant but which ever provider feels is better.    CB: [phone]

## 2017-03-03 NOTE — Telephone Encounter (Signed)
Spoke with [place] at [place], [phone]. States Dexilant is non-formulary. Preferred alternatives are omeprazole and lansoprazole.  Please see below response from pt and advise?

## 2017-03-04 ENCOUNTER — Other Ambulatory Visit: Payer: Self-pay | Admitting: Neurological Surgery

## 2017-03-04 MED ORDER — OMEPRAZOLE 40 MG PO CPDR: 40.0000 mg | DELAYED_RELEASE_CAPSULE | Freq: Every day | ORAL | 1 refills | Status: DC

## 2017-03-04 NOTE — Telephone Encounter (Signed)
Attempted to reach pt and left message to check mychart message. Message sent.

## 2017-03-04 NOTE — Addendum Note (Signed)
Addended by: Debbrah Alar on: 03/04/2017 01:18 PM   Modules accepted: Orders

## 2017-03-04 NOTE — Telephone Encounter (Signed)
rx sent for omeprazole instead.

## 2017-03-24 MED FILL — OXYCODONE W/APAP 5/325 TAB: 5-325 | 8 days supply | Qty: 50 | Fill #0

## 2017-04-02 MED FILL — METOPROLOL SUCC ER 25 MG TA: 25 | 90 days supply | Qty: 90 | Fill #0

## 2017-04-02 MED FILL — AMLODIPINE BESYLATE 10 MG T: 10 | 90 days supply | Qty: 90 | Fill #0

## 2017-04-13 NOTE — Pre-Procedure Instructions (Signed)
NATIYA SEELINGER  04/13/2017      [place], [place] - [address] [facility]. [address] [place] [address] Phone: [phone] Fax: [phone]    Your procedure is scheduled on 04/22/17.  Report to [facility] Admitting at 530 A.M.  Call this number if you have problems the morning of surgery:  [phone]   Remember:  Do not eat food or drink liquids after midnight.  Take these medicines the morning of surgery with A SIP OF WATER      Amlodipine(norvasc),metroplolol(toprol), Omeprazole(prilosec),valacyclcir(valtrex)  STOP all herbel meds, nsaids (aleve,naproxen,advil,ibuprofen)7 days prior to surgery starting  Tomorrow 04/15/17 including all vitamins/supplements,aspirin   Do not wear jewelry, make-up or nail polish.  Do not wear lotions, powders, or perfumes, or deoderant.  Do not shave 48 hours prior to surgery.  Men may shave face and neck.  Do not bring valuables to the hospital.  [facility] is not responsible for any belongings or valuables.  Contacts, dentures or bridgework may not be worn into surgery.  Leave your suitcase in the car.  After surgery it may be brought to your room.  For patients admitted to the hospital, discharge time will be determined by your treatment team.  Patients discharged the day of surgery will not be allowed to drive home.   Special instructions:  * Special Instructions:  - Preparing for Surgery  Before surgery, you can play an important role.  Because skin is not sterile, your skin needs to be as free of germs as possible.  You can reduce the number of germs on you skin by washing with CHG (chlorahexidine gluconate) soap before surgery.  CHG is an antiseptic cleaner which kills germs and bonds with the skin to continue killing germs even after washing.  Please DO NOT use if you have an allergy to CHG or antibacterial soaps.  If your skin becomes reddened/irritated stop  using the CHG and inform your nurse when you arrive at Short Stay.  Do not shave (including legs and underarms) for at least 48 hours prior to the first CHG shower.  You may shave your face.  Please follow these instructions carefully:   1.  Shower with CHG Soap the night before surgery and the morning of Surgery.  2.  If you choose to wash your hair, wash your hair first as usual with your normal shampoo.  3.  After you shampoo, rinse your hair and body thoroughly to remove the Shampoo.  4.  Use CHG as you would any other liquid soap.  You can apply chg directly  to the skin and wash gently with scrungie or a clean washcloth.  5.  Apply the CHG Soap to your body ONLY FROM THE NECK DOWN.  Do not use on open wounds or open sores.  Avoid contact with your eyes ears, mouth and genitals (private parts).  Wash genitals (private parts)       with your normal soap.  6.  Wash thoroughly, paying special attention to the area where your surgery will be performed.  7.  Thoroughly rinse your body with warm water from the neck down.  8.  DO NOT shower/wash with your normal soap after using and rinsing off the CHG Soap.  9.  Pat yourself dry with a clean towel.            10.  Wear clean pajamas.  11.  Place clean sheets on your bed the night of your first shower and do not sleep with pets.  Day of Surgery  Do not apply any lotions/deodorants the morning of surgery.  Please wear clean clothes to the hospital/surgery center.  Please read over the fact sheets that you were given.

## 2017-04-14 ENCOUNTER — Encounter (HOSPITAL_COMMUNITY): Payer: Self-pay

## 2017-04-14 ENCOUNTER — Encounter (HOSPITAL_COMMUNITY)
Admission: RE | Admit: 2017-04-14 | Discharge: 2017-04-14 | Disposition: A | Discharge status: Home / Self Care | Payer: 59 | Source: Ambulatory Visit | Attending: Neurological Surgery | Admitting: Neurological Surgery

## 2017-04-14 ENCOUNTER — Ambulatory Visit (HOSPITAL_COMMUNITY)
Admission: RE | Admit: 2017-04-14 | Discharge: 2017-04-14 | Disposition: A | Discharge status: Home / Self Care | Payer: 59 | Source: Ambulatory Visit | Attending: Neurological Surgery | Admitting: Neurological Surgery

## 2017-04-14 DIAGNOSIS — M5126 Other intervertebral disc displacement, lumbar region: Secondary | ICD-10-CM

## 2017-04-14 DIAGNOSIS — Z01818 Encounter for other preprocedural examination: Secondary | ICD-10-CM | POA: Diagnosis not present

## 2017-04-14 HISTORY — DX: Essential (primary) hypertension: I10

## 2017-04-14 LAB — CBC WITH DIFFERENTIAL/PLATELET
Basophils Absolute: 0 10*3/uL (ref 0.0–0.1)
Basophils Relative: 0 %
Eosinophils Absolute: 0.1 10*3/uL (ref 0.0–0.7)
Eosinophils Relative: 2 %
HCT: 39.7 % (ref 36.0–46.0)
Hemoglobin: 13.8 g/dL (ref 12.0–15.0)
Lymphocytes Relative: 29 %
Lymphs Abs: 2.2 10*3/uL (ref 0.7–4.0)
MCH: 31.5 pg (ref 26.0–34.0)
MCHC: 34.8 g/dL (ref 30.0–36.0)
MCV: 90.6 fL (ref 78.0–100.0)
Monocytes Absolute: 0.5 10*3/uL (ref 0.1–1.0)
Monocytes Relative: 7 %
Neutro Abs: 4.9 10*3/uL (ref 1.7–7.7)
Neutrophils Relative %: 62 %
Platelets: 295 10*3/uL (ref 150–400)
RBC: 4.38 MIL/uL (ref 3.87–5.11)
RDW: 12.8 % (ref 11.5–15.5)
WBC: 7.7 10*3/uL (ref 4.0–10.5)

## 2017-04-14 LAB — BASIC METABOLIC PANEL
Anion gap: 6 (ref 5–15)
BUN: 15 mg/dL (ref 6–20)
CO2: 26 mmol/L (ref 22–32)
Calcium: 9.2 mg/dL (ref 8.9–10.3)
Chloride: 109 mmol/L (ref 101–111)
Creatinine, Ser: 0.87 mg/dL (ref 0.44–1.00)
GFR calc Af Amer: >60 mL/min (ref >60)
GFR calc non Af Amer: >60 mL/min (ref >60)
Glucose, Bld: 124 mg/dL — ABNORMAL HIGH (ref 65–99)
Potassium: 3.5 mmol/L (ref 3.5–5.1)
Sodium: 141 mmol/L (ref 135–145)

## 2017-04-14 LAB — SURGICAL PCR SCREEN
MRSA, PCR: NEGATIVE
Staphylococcus aureus: NEGATIVE

## 2017-04-14 LAB — HCG, SERUM, QUALITATIVE: Preg, Serum: NEGATIVE

## 2017-04-14 LAB — PROTIME-INR
INR: 1
Prothrombin Time: 13.2 seconds (ref 11.4–15.2)

## 2017-04-14 NOTE — Progress Notes (Signed)
PCP: Dr. Yvone Neu   Cardiologist: Denies   EKG: 01-16-17 CXR: Pending 04/14/17 ECHO, Stress Test, Cardiac Cath: denies   Patient denies shortness of breath, fever, cough, and chest pain at PAT appointment.  Patient verbalized understanding of instructions provided today at the PAT appointment.  Patient asked to review instructions at home and day of surgery.

## 2017-04-21 NOTE — Anesthesia Preprocedure Evaluation (Addendum)
Anesthesia Evaluation  Patient identified by MRN, date of birth, ID band Patient awake    Reviewed: Allergy & Precautions, NPO status , Patient's Chart, lab work & pertinent test results  Airway Mallampati: II  TM Distance: >3 FB Neck ROM: Full    Dental no notable dental hx.    Pulmonary neg pulmonary ROS,    Pulmonary exam normal breath sounds clear to auscultation       Cardiovascular hypertension, Pt. on medications and Pt. on home beta blockers Normal cardiovascular exam Rhythm:Regular Rate:Normal  ECG: SR, rate 70   Neuro/Psych negative neurological ROS  negative psych ROS   GI/Hepatic Neg liver ROS, GERD  Medicated and Controlled,  Endo/Other  negative endocrine ROS  Renal/GU negative Renal ROS  negative genitourinary   Musculoskeletal negative musculoskeletal ROS (+)   Abdominal (+) + obese,   Peds negative pediatric ROS (+)  Hematology negative hematology ROS (+)   Anesthesia Other Findings Obese  hcg negative  Reproductive/Obstetrics negative OB ROS                           Anesthesia Physical Anesthesia Plan  ASA: III  Anesthesia Plan: General   Post-op Pain Management:    Induction: Intravenous  PONV Risk Score and Plan: 3 and Ondansetron, Dexamethasone, Propofol and Midazolam  Airway Management Planned: Oral ETT  Additional Equipment:   Intra-op Plan:   Post-operative Plan: Extubation in OR  Informed Consent: I have reviewed the patients History and Physical, chart, labs and discussed the procedure including the risks, benefits and alternatives for the proposed anesthesia with the patient or authorized representative who has indicated his/her understanding and acceptance.   Dental advisory given  Plan Discussed with: CRNA  Anesthesia Plan Comments:        Anesthesia Quick Evaluation

## 2017-04-22 ENCOUNTER — Ambulatory Visit (HOSPITAL_COMMUNITY): Payer: 59 | Admitting: Anesthesiology

## 2017-04-22 ENCOUNTER — Observation Stay (HOSPITAL_COMMUNITY)
Admission: RE | Admit: 2017-04-22 | Discharge: 2017-04-23 | Disposition: A | Discharge status: Home / Self Care | Payer: 59 | Source: Ambulatory Visit | Attending: Neurological Surgery | Admitting: Neurological Surgery

## 2017-04-22 ENCOUNTER — Encounter (HOSPITAL_COMMUNITY)
Admission: RE | Disposition: A | Discharge status: Home / Self Care | Payer: Self-pay | Source: Ambulatory Visit | Attending: Neurological Surgery

## 2017-04-22 ENCOUNTER — Encounter (HOSPITAL_COMMUNITY): Payer: Self-pay | Admitting: Certified Registered Nurse Anesthetist

## 2017-04-22 ENCOUNTER — Ambulatory Visit (HOSPITAL_COMMUNITY): Payer: 59

## 2017-04-22 DIAGNOSIS — Z79899 Other long term (current) drug therapy: Secondary | ICD-10-CM | POA: Diagnosis not present

## 2017-04-22 DIAGNOSIS — K219 Gastro-esophageal reflux disease without esophagitis: Secondary | ICD-10-CM | POA: Diagnosis not present

## 2017-04-22 DIAGNOSIS — Z9889 Other specified postprocedural states: Secondary | ICD-10-CM

## 2017-04-22 DIAGNOSIS — Z6836 Body mass index (BMI) 36.0-36.9, adult: Secondary | ICD-10-CM | POA: Insufficient documentation

## 2017-04-22 DIAGNOSIS — R739 Hyperglycemia, unspecified: Secondary | ICD-10-CM | POA: Diagnosis not present

## 2017-04-22 DIAGNOSIS — E669 Obesity, unspecified: Secondary | ICD-10-CM | POA: Insufficient documentation

## 2017-04-22 DIAGNOSIS — M79606 Pain in leg, unspecified: Secondary | ICD-10-CM | POA: Diagnosis present

## 2017-04-22 DIAGNOSIS — I10 Essential (primary) hypertension: Secondary | ICD-10-CM | POA: Insufficient documentation

## 2017-04-22 DIAGNOSIS — Z419 Encounter for procedure for purposes other than remedying health state, unspecified: Secondary | ICD-10-CM

## 2017-04-22 DIAGNOSIS — M5126 Other intervertebral disc displacement, lumbar region: Principal | ICD-10-CM | POA: Insufficient documentation

## 2017-04-22 DIAGNOSIS — Z01818 Encounter for other preprocedural examination: Secondary | ICD-10-CM | POA: Diagnosis not present

## 2017-04-22 HISTORY — PX: LUMBAR LAMINECTOMY/DECOMPRESSION MICRODISCECTOMY: SHX5026

## 2017-04-22 SURGERY — LUMBAR LAMINECTOMY/DECOMPRESSION MICRODISCECTOMY 1 LEVEL
Anesthesia: General | Site: Back | Laterality: Right

## 2017-04-22 MED ORDER — SUGAMMADEX SODIUM 200 MG/2ML IV SOLN
INTRAVENOUS | Status: DC | PRN
Start: 2017-04-22 — End: 2017-04-22
  Administered 2017-04-22: 196.8 mg via INTRAVENOUS

## 2017-04-22 MED ORDER — PHENYLEPHRINE HCL 10 MG/ML IJ SOLN
INTRAVENOUS | Status: DC | PRN
  Administered 2017-04-22: 25 ug/min via INTRAVENOUS

## 2017-04-22 MED ORDER — NON FORMULARY
Status: DC | PRN
  Administered 2017-04-22: 100 ug

## 2017-04-22 MED ORDER — FENTANYL CITRATE (PF) 250 MCG/5ML IJ SOLN
INTRAMUSCULAR | Status: AC
Start: 2017-04-22 — End: 2017-04-22
  Filled 2017-04-22: qty 5

## 2017-04-22 MED ORDER — ACETAMINOPHEN 325 MG PO TABS: 650.0000 mg | ORAL_TABLET | ORAL | Status: DC | PRN

## 2017-04-22 MED ORDER — METHOCARBAMOL 1000 MG/10ML IJ SOLN
500.0000 mg | Freq: Four times a day (QID) | INTRAMUSCULAR | Status: DC | PRN
  Filled 2017-04-22: qty 5

## 2017-04-22 MED ORDER — METHYLPREDNISOLONE ACETATE 80 MG/ML IJ SUSP
INTRAMUSCULAR | Status: AC
  Filled 2017-04-22: qty 1

## 2017-04-22 MED ORDER — DEXAMETHASONE SODIUM PHOSPHATE 10 MG/ML IJ SOLN
INTRAMUSCULAR | Status: AC
  Filled 2017-04-22: qty 1

## 2017-04-22 MED ORDER — POTASSIUM CHLORIDE IN NACL 20-0.9 MEQ/L-% IV SOLN: INTRAVENOUS | Status: DC

## 2017-04-22 MED ORDER — PANTOPRAZOLE SODIUM 40 MG PO TBEC
40.0000 mg | DELAYED_RELEASE_TABLET | Freq: Every day | ORAL | Status: DC
  Administered 2017-04-23: 40 mg via ORAL
  Filled 2017-04-22: qty 1

## 2017-04-22 MED ORDER — DEXAMETHASONE SODIUM PHOSPHATE 4 MG/ML IJ SOLN
4.0000 mg | Freq: Four times a day (QID) | INTRAMUSCULAR | Status: DC
  Administered 2017-04-22: 4 mg via INTRAVENOUS
  Filled 2017-04-22: qty 1

## 2017-04-22 MED ORDER — IBUPROFEN 200 MG PO TABS: 600.0000 mg | ORAL_TABLET | Freq: Four times a day (QID) | ORAL | Status: DC | PRN

## 2017-04-22 MED ORDER — FENTANYL CITRATE (PF) 100 MCG/2ML IJ SOLN
INTRAMUSCULAR | Status: AC
  Filled 2017-04-22: qty 2

## 2017-04-22 MED ORDER — CEFAZOLIN SODIUM-DEXTROSE 2-4 GM/100ML-% IV SOLN
2.0000 g | Freq: Three times a day (TID) | INTRAVENOUS | Status: AC
  Administered 2017-04-22 (×2): 2 g via INTRAVENOUS
  Filled 2017-04-22 (×3): qty 100

## 2017-04-22 MED ORDER — DEXAMETHASONE SODIUM PHOSPHATE 10 MG/ML IJ SOLN
10.0000 mg | INTRAMUSCULAR | Status: AC
  Administered 2017-04-22: 10 mg via INTRAVENOUS

## 2017-04-22 MED ORDER — BUPIVACAINE HCL (PF) 0.25 % IJ SOLN
INTRAMUSCULAR | Status: AC
  Filled 2017-04-22: qty 30

## 2017-04-22 MED ORDER — PHENYLEPHRINE HCL 10 MG/ML IJ SOLN
INTRAMUSCULAR | Status: DC | PRN
  Administered 2017-04-22 (×2): 80 ug via INTRAVENOUS
  Administered 2017-04-22: 120 ug via INTRAVENOUS

## 2017-04-22 MED ORDER — THROMBIN 5000 UNITS EX SOLR
CUTANEOUS | Status: DC | PRN
  Administered 2017-04-22: 10000 [IU] via TOPICAL

## 2017-04-22 MED ORDER — FENTANYL CITRATE (PF) 100 MCG/2ML IJ SOLN
INTRAMUSCULAR | Status: DC | PRN
  Administered 2017-04-22: 150 ug via INTRAVENOUS

## 2017-04-22 MED ORDER — ACETAMINOPHEN 650 MG RE SUPP: 650.0000 mg | RECTAL | Status: DC | PRN

## 2017-04-22 MED ORDER — HYDROMORPHONE HCL 1 MG/ML IJ SOLN
0.2500 mg | INTRAMUSCULAR | Status: DC | PRN
  Administered 2017-04-22 (×2): 0.5 mg via INTRAVENOUS

## 2017-04-22 MED ORDER — HYDROMORPHONE HCL 1 MG/ML IJ SOLN
INTRAMUSCULAR | Status: AC
  Filled 2017-04-22: qty 0.5

## 2017-04-22 MED ORDER — SENNA 8.6 MG PO TABS
1.0000 | ORAL_TABLET | Freq: Two times a day (BID) | ORAL | Status: DC
  Administered 2017-04-23: 8.6 mg via ORAL
  Filled 2017-04-22: qty 1

## 2017-04-22 MED ORDER — PROMETHAZINE HCL 25 MG/ML IJ SOLN: 6.2500 mg | INTRAMUSCULAR | Status: DC | PRN

## 2017-04-22 MED ORDER — MORPHINE SULFATE (PF) 4 MG/ML IV SOLN
2.0000 mg | INTRAVENOUS | Status: DC | PRN
  Administered 2017-04-22: 2 mg via INTRAVENOUS
  Filled 2017-04-22: qty 1

## 2017-04-22 MED ORDER — DEXAMETHASONE 4 MG PO TABS
4.0000 mg | ORAL_TABLET | Freq: Four times a day (QID) | ORAL | Status: DC
  Administered 2017-04-22 – 2017-04-23 (×4): 4 mg via ORAL
  Filled 2017-04-22 (×4): qty 1

## 2017-04-22 MED ORDER — PROPOFOL 10 MG/ML IV BOLUS
INTRAVENOUS | Status: DC | PRN
  Administered 2017-04-22: 160 mg via INTRAVENOUS

## 2017-04-22 MED ORDER — ONDANSETRON HCL 4 MG/2ML IJ SOLN
4.0000 mg | Freq: Four times a day (QID) | INTRAMUSCULAR | Status: DC | PRN
  Administered 2017-04-22: 4 mg via INTRAVENOUS
  Filled 2017-04-22: qty 2

## 2017-04-22 MED ORDER — 0.9 % SODIUM CHLORIDE (POUR BTL) OPTIME
TOPICAL | Status: DC | PRN
  Administered 2017-04-22: 1000 mL

## 2017-04-22 MED ORDER — LACTATED RINGERS IV SOLN
INTRAVENOUS | Status: DC | PRN
  Administered 2017-04-22 (×2): via INTRAVENOUS

## 2017-04-22 MED ORDER — THROMBIN 5000 UNITS EX SOLR
CUTANEOUS | Status: AC
  Filled 2017-04-22: qty 5000

## 2017-04-22 MED ORDER — MIDAZOLAM HCL 2 MG/2ML IJ SOLN
INTRAMUSCULAR | Status: AC
  Filled 2017-04-22: qty 2

## 2017-04-22 MED ORDER — CEFAZOLIN SODIUM-DEXTROSE 2-4 GM/100ML-% IV SOLN
2.0000 g | INTRAVENOUS | Status: AC
  Administered 2017-04-22: 2 g via INTRAVENOUS

## 2017-04-22 MED ORDER — VALACYCLOVIR HCL 500 MG PO TABS
500.0000 mg | ORAL_TABLET | Freq: Every day | ORAL | Status: DC
  Filled 2017-04-22: qty 1

## 2017-04-22 MED ORDER — GELATIN ABSORBABLE MT POWD
OROMUCOSAL | Status: DC | PRN
  Administered 2017-04-22: 08:00:00 via TOPICAL

## 2017-04-22 MED ORDER — AMLODIPINE BESYLATE 10 MG PO TABS
10.0000 mg | ORAL_TABLET | Freq: Every day | ORAL | Status: DC
  Filled 2017-04-22: qty 1

## 2017-04-22 MED ORDER — MIDAZOLAM HCL 5 MG/5ML IJ SOLN
INTRAMUSCULAR | Status: DC | PRN
  Administered 2017-04-22: 2 mg via INTRAVENOUS

## 2017-04-22 MED ORDER — ROCURONIUM BROMIDE 100 MG/10ML IV SOLN
INTRAVENOUS | Status: DC | PRN
  Administered 2017-04-22: 80 mg via INTRAVENOUS

## 2017-04-22 MED ORDER — MIDAZOLAM HCL 5 MG/5ML IJ SOLN: INTRAMUSCULAR | Status: DC | PRN

## 2017-04-22 MED ORDER — SODIUM CHLORIDE 0.9% FLUSH
3.0000 mL | Freq: Two times a day (BID) | INTRAVENOUS | Status: DC
  Administered 2017-04-22 (×2): 3 mL via INTRAVENOUS

## 2017-04-22 MED ORDER — HEMOSTATIC AGENTS (NO CHARGE) OPTIME
TOPICAL | Status: DC | PRN
  Administered 2017-04-22: 1 via TOPICAL

## 2017-04-22 MED ORDER — METHOCARBAMOL 500 MG PO TABS
500.0000 mg | ORAL_TABLET | Freq: Four times a day (QID) | ORAL | Status: DC | PRN
  Administered 2017-04-22 – 2017-04-23 (×4): 500 mg via ORAL
  Filled 2017-04-22 (×4): qty 1

## 2017-04-22 MED ORDER — PROPOFOL 10 MG/ML IV BOLUS
INTRAVENOUS | Status: AC
  Filled 2017-04-22: qty 40

## 2017-04-22 MED ORDER — CHLORHEXIDINE GLUCONATE CLOTH 2 % EX PADS: 6.0000 | MEDICATED_PAD | Freq: Once | CUTANEOUS | Status: DC

## 2017-04-22 MED ORDER — PHENOL 1.4 % MT LIQD: 1.0000 | OROMUCOSAL | Status: DC | PRN

## 2017-04-22 MED ORDER — HYDROCODONE-ACETAMINOPHEN 7.5-325 MG PO TABS
1.0000 | ORAL_TABLET | Freq: Four times a day (QID) | ORAL | Status: DC
  Administered 2017-04-22 – 2017-04-23 (×5): 1 via ORAL
  Filled 2017-04-22 (×5): qty 1

## 2017-04-22 MED ORDER — MENTHOL 3 MG MT LOZG: 1.0000 | LOZENGE | OROMUCOSAL | Status: DC | PRN

## 2017-04-22 MED ORDER — METOPROLOL SUCCINATE ER 25 MG PO TB24
25.0000 mg | ORAL_TABLET | Freq: Every day | ORAL | Status: DC
  Filled 2017-04-22: qty 1

## 2017-04-22 MED ORDER — BUPIVACAINE HCL (PF) 0.25 % IJ SOLN
INTRAMUSCULAR | Status: DC | PRN
  Administered 2017-04-22: 10 mL

## 2017-04-22 MED ORDER — ONDANSETRON HCL 4 MG PO TABS: 4.0000 mg | ORAL_TABLET | Freq: Four times a day (QID) | ORAL | Status: DC | PRN

## 2017-04-22 MED ORDER — THROMBIN 5000 UNITS EX SOLR
CUTANEOUS | Status: AC
  Filled 2017-04-22: qty 10000

## 2017-04-22 MED ORDER — SODIUM CHLORIDE 0.9% FLUSH: 3.0000 mL | INTRAVENOUS | Status: DC | PRN

## 2017-04-22 MED ORDER — SODIUM CHLORIDE 0.9 % IR SOLN
Status: DC | PRN
  Administered 2017-04-22: 08:00:00

## 2017-04-22 MED ORDER — METHYLPREDNISOLONE ACETATE 80 MG/ML IJ SUSP
INTRAMUSCULAR | Status: DC | PRN
  Administered 2017-04-22: 80 mg

## 2017-04-22 MED ORDER — ONDANSETRON HCL 4 MG/2ML IJ SOLN
INTRAMUSCULAR | Status: DC | PRN
  Administered 2017-04-22: 4 mg via INTRAVENOUS

## 2017-04-22 MED FILL — Fentanyl Citrate Preservative Free (PF) Inj 100 MCG/2ML: INTRAMUSCULAR | Qty: 2 | Status: AC

## 2017-04-22 MED FILL — Methylprednisolone Acetate Inj Susp 80 MG/ML: INTRAMUSCULAR | Qty: 1 | Status: AC

## 2017-04-22 SURGICAL SUPPLY — 46 items
BAG DECANTER FOR FLEXI CONT (MISCELLANEOUS) ×2 IMPLANT
BENZOIN TINCTURE PRP APPL 2/3 (GAUZE/BANDAGES/DRESSINGS) ×2 IMPLANT
BOWL SPATULA (MISCELLANEOUS) ×2 IMPLANT
BUR MATCHSTICK NEURO 3.0 LAGG (BURR) ×2 IMPLANT
CANISTER SUCT 3000ML PPV (MISCELLANEOUS) ×2 IMPLANT
CARTRIDGE OIL MAESTRO DRILL (MISCELLANEOUS) ×1 IMPLANT
DIFFUSER DRILL AIR PNEUMATIC (MISCELLANEOUS) ×2 IMPLANT
DRAPE LAPAROTOMY 100X72X124 (DRAPES) ×2 IMPLANT
DRAPE MICROSCOPE LEICA (MISCELLANEOUS) ×2 IMPLANT
DRAPE POUCH INSTRU U-SHP 10X18 (DRAPES) ×2 IMPLANT
DRAPE SURG 17X23 STRL (DRAPES) ×2 IMPLANT
DRSG OPSITE POSTOP 3X4 (GAUZE/BANDAGES/DRESSINGS) ×2 IMPLANT
DURAPREP 26ML APPLICATOR (WOUND CARE) ×2 IMPLANT
ELECT REM PT RETURN 9FT ADLT (ELECTROSURGICAL) ×2
ELECTRODE REM PT RTRN 9FT ADLT (ELECTROSURGICAL) ×1 IMPLANT
GAUZE SPONGE 4X4 16PLY XRAY LF (GAUZE/BANDAGES/DRESSINGS) IMPLANT
GLOVE BIO SURGEON STRL SZ7 (GLOVE) IMPLANT
GLOVE BIO SURGEON STRL SZ8 (GLOVE) ×2 IMPLANT
GLOVE BIOGEL PI IND STRL 7.0 (GLOVE) IMPLANT
GLOVE BIOGEL PI INDICATOR 7.0 (GLOVE)
GOWN STRL REUS W/ TWL LRG LVL3 (GOWN DISPOSABLE) IMPLANT
GOWN STRL REUS W/ TWL XL LVL3 (GOWN DISPOSABLE) ×1 IMPLANT
GOWN STRL REUS W/TWL 2XL LVL3 (GOWN DISPOSABLE) IMPLANT
GOWN STRL REUS W/TWL LRG LVL3 (GOWN DISPOSABLE)
GOWN STRL REUS W/TWL XL LVL3 (GOWN DISPOSABLE) ×1
HEMOSTAT POWDER KIT SURGIFOAM (HEMOSTASIS) IMPLANT
KIT BASIN OR (CUSTOM PROCEDURE TRAY) ×2 IMPLANT
KIT ROOM TURNOVER OR (KITS) ×2 IMPLANT
NEEDLE HYPO 18GX1.5 BLUNT FILL (NEEDLE) ×2 IMPLANT
NEEDLE HYPO 25X1 1.5 SAFETY (NEEDLE) ×2 IMPLANT
NEEDLE SPNL 20GX3.5 QUINCKE YW (NEEDLE) ×2 IMPLANT
NS IRRIG 1000ML POUR BTL (IV SOLUTION) ×2 IMPLANT
OIL CARTRIDGE MAESTRO DRILL (MISCELLANEOUS) ×2
PACK LAMINECTOMY NEURO (CUSTOM PROCEDURE TRAY) ×2 IMPLANT
PAD ARMBOARD 7.5X6 YLW CONV (MISCELLANEOUS) ×6 IMPLANT
RUBBERBAND STERILE (MISCELLANEOUS) ×4 IMPLANT
SPONGE SURGIFOAM ABS GEL SZ50 (HEMOSTASIS) ×2 IMPLANT
STRIP CLOSURE SKIN 1/2X4 (GAUZE/BANDAGES/DRESSINGS) ×2 IMPLANT
SUT VIC AB 0 CT1 18XCR BRD8 (SUTURE) ×2 IMPLANT
SUT VIC AB 0 CT1 8-18 (SUTURE) ×2
SUT VIC AB 2-0 CP2 18 (SUTURE) ×4 IMPLANT
SUT VIC AB 3-0 SH 8-18 (SUTURE) IMPLANT
SYR 3ML LL SCALE MARK (SYRINGE) ×2 IMPLANT
TOWEL GREEN STERILE (TOWEL DISPOSABLE) ×2 IMPLANT
TOWEL GREEN STERILE FF (TOWEL DISPOSABLE) ×2 IMPLANT
WATER STERILE IRR 1000ML POUR (IV SOLUTION) ×2 IMPLANT

## 2017-04-22 NOTE — H&P (Signed)
Subjective: Patient is a 46 y.o. female admitted for leg pain. Onset of symptoms was several months ago, gradually worsening since that time.  The pain is rated severe, and is located at the across the lower back and radiates to R quad. The pain is described as aching and occurs all day. The symptoms have been progressive. Symptoms are exacerbated by exercise. MRI or CT showed R L2-3 extraforaminal HNP   Past Medical History:  Diagnosis Date  . GERD (gastroesophageal reflux disease)   . Hypertension   . Uterine fibroid     Past Surgical History:  Procedure Laterality Date  . BUNIONECTOMY    . CHOLECYSTECTOMY      Prior to Admission medications   Medication Sig Start Date End Date Taking? Authorizing Provider  amLODipine (NORVASC) 10 MG tablet Take 1 tablet (10 mg total) by mouth daily. 01/16/17  Yes Debbrah Alar, NP  metoprolol succinate (TOPROL-XL) 25 MG 24 hr tablet Take 1 tablet (25 mg total) by mouth daily. 01/16/17  Yes Debbrah Alar, NP  omeprazole (PRILOSEC) 40 MG capsule Take 1 capsule (40 mg total) by mouth daily. 03/04/17  Yes Debbrah Alar, NP  valACYclovir (VALTREX) 500 MG tablet Take 1 tablet (500 mg total) by mouth daily. 01/16/17  Yes Debbrah Alar, NP  HYDROcodone-acetaminophen (NORCO/VICODIN) 5-325 MG tablet Take 1 tablet by mouth every 8 (eight) hours as needed for moderate pain.    [provider]  ibuprofen (ADVIL,MOTRIN) 200 MG tablet Take 600-800 mg by mouth every 8 (eight) hours as needed for moderate pain.    [provider]   Allergies  Allergen Reactions  . Oxycodone-Acetaminophen Nausea And Vomiting and Rash    Social History  Substance Use Topics  . Smoking status: Never Smoker  . Smokeless tobacco: Never Used  . Alcohol use 0.0 oz/week     Comment: occasional 1-2 times a month    Family History  Problem Relation Age of Onset  . Hypertension Mother   . Diabetes Mother   . Hypertension Father        smoker  .  COPD Father   . Single kidney Sister        removed as a child  . Heart attack Neg Hx   . Hyperlipidemia Neg Hx   . Sudden death Neg Hx      Review of Systems  Positive ROS: neg  All other systems have been reviewed and were otherwise negative with the exception of those mentioned in the HPI and as above.  Objective: Vital signs in last 24 hours: Temp:  [98.3 F (36.8 C)] 98.3 F (36.8 C) (06/27 0636) Pulse Rate:  [70] 70 (06/27 0636) Resp:  [18] 18 (06/27 0636) BP: (128)/(80) 128/80 (06/27 0636) SpO2:  [96 %] 96 % (06/27 0636)  General Appearance: Alert, cooperative, no distress, appears stated age Head: Normocephalic, without obvious abnormality, atraumatic Eyes: PERRL, conjunctiva/corneas clear, EOM's intact    Neck: Supple, symmetrical, trachea midline Back: Symmetric, no curvature, ROM normal, no CVA tenderness Lungs:  respirations unlabored Heart: Regular rate and rhythm Abdomen: Soft, non-tender Extremities: Extremities normal, atraumatic, no cyanosis or edema Pulses: 2+ and symmetric all extremities Skin: Skin color, texture, turgor normal, no rashes or lesions  NEUROLOGIC:   Mental status: Alert and oriented x4,  no aphasia, good attention span, fund of knowledge, and memory Motor Exam - grossly normal Sensory Exam - grossly normal Reflexes: 1+ Coordination - grossly normal Gait - grossly normal Balance - grossly normal Cranial Nerves:  I: smell Not tested  II: visual acuity  OS: nl    OD: nl  II: visual fields Full to confrontation  II: pupils Equal, round, reactive to light  III,VII: ptosis None  III,IV,VI: extraocular muscles  Full ROM  V: mastication Normal  V: facial light touch sensation  Normal  V,VII: corneal reflex  Present  VII: facial muscle function - upper  Normal  VII: facial muscle function - lower Normal  VIII: hearing Not tested  IX: soft palate elevation  Normal  IX,X: gag reflex Present  XI: trapezius strength  5/5  XI:  sternocleidomastoid strength 5/5  XI: neck flexion strength  5/5  XII: tongue strength  Normal    Data Review Lab Results  Component Value Date   WBC 7.7 04/14/2017   HGB 13.8 04/14/2017   HCT 39.7 04/14/2017   MCV 90.6 04/14/2017   PLT 295 04/14/2017   Lab Results  Component Value Date   NA 141 04/14/2017   K 3.5 04/14/2017   CL 109 04/14/2017   CO2 26 04/14/2017   BUN 15 04/14/2017   CREATININE 0.87 04/14/2017   GLUCOSE 124 (H) 04/14/2017   Lab Results  Component Value Date   INR 1.00 04/14/2017    Assessment/Plan: Patient admitted for R L2-3 extraforaminal micro. Patient has failed a reasonable attempt at conservative therapy.  I explained the condition and procedure to the patient and answered any questions.  Patient wishes to proceed with procedure as planned. Understands risks/ benefits and typical outcomes of procedure.   Mardie Kellen S 04/22/2017 6:45 AM

## 2017-04-22 NOTE — Transfer of Care (Signed)
Immediate Anesthesia Transfer of Care Note  Patient: Denise Austin  Procedure(s) Performed: Procedure(s): Extraforaminal Microdiscectomy - Lumbar two-Lumbar three - right (Right)  Patient Location: PACU  Anesthesia Type:General  Level of Consciousness: awake, patient cooperative and responds to stimulation  Airway & Oxygen Therapy: Patient Spontanous Breathing and Patient connected to face mask oxygen  Post-op Assessment: Report given to RN, Post -op Vital signs reviewed and stable and Patient moving all extremities X 4  Post vital signs: Reviewed and stable  Last Vitals:  Vitals:   04/22/17 0636  BP: 128/80  Pulse: 70  Resp: 18  Temp: 36.8 C    Last Pain:  Vitals:   04/22/17 0636  TempSrc: Oral         Complications: No apparent anesthesia complications

## 2017-04-22 NOTE — Anesthesia Postprocedure Evaluation (Signed)
Anesthesia Post Note  Patient: Denise Austin  Procedure(s) Performed: Procedure(s) (LRB): Extraforaminal Microdiscectomy - Lumbar two-Lumbar three - right (Right)     Patient location during evaluation: PACU Anesthesia Type: General Level of consciousness: awake and alert Pain management: pain level controlled Vital Signs Assessment: post-procedure vital signs reviewed and stable Respiratory status: spontaneous breathing, nonlabored ventilation, respiratory function stable and patient connected to nasal cannula oxygen Cardiovascular status: blood pressure returned to baseline and stable Postop Assessment: no signs of nausea or vomiting Anesthetic complications: no    Last Vitals:  Vitals:   04/22/17 1417 04/22/17 1635  BP: 103/61 (!) 104/55  Pulse: 88 86  Resp: 16 16  Temp: 36.4 C 36.7 C    Last Pain:  Vitals:   04/22/17 1659  TempSrc:   PainSc: 6                  Judiann Celia P Jan Walters

## 2017-04-22 NOTE — Op Note (Signed)
04/22/2017  9:36 AM  PATIENT:  Denise Austin  46 y.o. female  PRE-OPERATIVE DIAGNOSIS:  Right L2-3 extraforaminal disc protrusion with back and right thigh pain  POST-OPERATIVE DIAGNOSIS:  same  PROCEDURE:   right L2-3 extraforaminal microdiscectomy utilizing microscopic dissection  SURGEON:  Sherley Bounds, MD  ASSISTANTS: Meyran FNP  ANESTHESIA:   General  EBL: less than 50 ml  Total I/O In: 1000 [I.V.:1000] Out: 50 [Blood:50]  BLOOD ADMINISTERED: none  DRAINS: none  SPECIMEN:  none  INDICATION FOR PROCEDURE: This patient presented with R thigh pain. Imaging shoa extreme lateral right L2-3 fragment adjacent to the disc space under the L2 nerve root. The patient tried conservative measures without relief. Pain was debilitating. RecRight L2-3 extra foraminal microdiscectomy. Patient understood the risks, benefits, and alternatives and potential outcomes and wished to proceed.  PROCEDURE DETAILS: The patient was taken to the operating room and after induction of adequate generalized endotracheal anesthesia, the patient was rolled into the prone position on the Wilson frame and all pressure points were padded. The lumbar region was cleaned and then prepped with DuraPrep and draped in the usual sterile fashion. 5 cc of local anesthesia was injected and then a dorsal midline incision was made and carried down to the lumbo sacral fascia. The fascia was opened and the paraspinous musculature was taken down in a subperiosteal fashion to expose L2-3 on the right. Intraoperative x-ray confirmed my level, and then I used a combination of the high-speed drill and the Kerrison punches to perform an extraforaminal foraminotomy at L2-3 on the right. I drilled a small amount of the lateral part of the pars and the superior facet. The underlying intertransverse ligament was opened and removed in a piecemeal fashion to expose the exiting nerve root.. The nerve root was well decompressed. I coagulated  the epidural venous vasculature in the axilla of the nerve root. I found a subannular disc herniation.  I performed a thorough intradiscal discectomy with pituitary rongeurs and curettes, until I had a nice decompression of the nerve root and the midline. I knew the piece was extreme lateral and I spent considerable time working up under the L2 nerve root laterally. I worked lateral enough that I could see where the disc space started to drop off anteriorly.  I then palpated with a coronary dilator along the nerve root and into the foramen to assure adequate decompression. I felt no more compression of the nerve root. I irrigated with saline solution containing bacitracin. Achieved hemostasis with bipolar cautery, placed Depo-Medrol and fentanyl into the decompression and then closed the fascia with 0 Vicryl. I closed the subcutaneous tissues with 2-0 Vicryl and the subcuticular tissues with 3-0 Vicryl. The skin was then closed with benzoin and Steri-Strips. The drapes were removed, a sterile dressing was applied. The patient was awakened from general anesthesia and transferred to the recovery room in stable condition. At the end of the procedure all sponge, needle and instrument counts were correct.    PLAN OF CARE: Admit for overnight observation  PATIENT DISPOSITION:  PACU - hemodynamically stable.   Delay start of Pharmacological VTE agent (>24hrs) due to surgical blood loss or risk of bleeding:  yes

## 2017-04-22 NOTE — Evaluation (Signed)
Physical Therapy Evaluation Patient Details Name: Denise Austin MRN: [id number] DOB: 03-18-1971 Today's Date: 04/22/2017   History of Present Illness  Pt is 46 y/o female s/p L2-3 extraforaminal microdiscectomy. PMH includes HTN.   Clinical Impression  Patient is s/p above surgery resulting in the deficits listed below (see PT Problem List). PTA, pt was independent with functional mobility, however, had pain in RLE at baseline. Upon evaluation, pt limited by pain and weakness, especially in RLE, and was only able to tolerate limited ambulation distance. Required min to min guard assist during mobility. Pt reports her son and mother will be able to assist at d/c. Recommendations below.  Patient will benefit from skilled PT to increase their independence and safety with mobility (while adhering to their precautions) to allow discharge to the venue listed below. Will continue to follow acutely.      Follow Up Recommendations No PT follow up;Supervision for mobility/OOB    Equipment Recommendations  3in1 (PT)    Recommendations for Other Services       Precautions / Restrictions Precautions Precautions: Back Precaution Booklet Issued: Yes (comment) Precaution Comments: Reviewed back precautions with pt and administered handout.  Restrictions Weight Bearing Restrictions: No      Mobility  Bed Mobility Overal bed mobility: Needs Assistance Bed Mobility: Rolling;Sidelying to Sit;Sit to Sidelying Rolling: Min guard Sidelying to sit: Min guard     Sit to sidelying: Min guard General bed mobility comments: Min guard to ensure appropriate log roll technique.   Transfers Overall transfer level: Needs assistance Equipment used: 1 person hand held assist Transfers: Sit to/from Stand Sit to Stand: Min assist         General transfer comment: Min A for steadying and for lift assist. Extra time required secondary to pain   Ambulation/Gait Ambulation/Gait assistance: Min  guard Ambulation Distance (Feet): 100 Feet Assistive device: 1 person hand held assist Gait Pattern/deviations: Step-through pattern;Decreased stance time - right;Decreased weight shift to right;Antalgic Gait velocity: Decreased Gait velocity interpretation: Below normal speed for age/gender General Gait Details: Slow, guarded gait secondary to pain. Decreased weightshift to RLE secondary to radiating pain. Min guard for safety, but required HHA for steadying. Need to assess if pt will need AD in next session. Educated to perform general walking exercise program upon return home. Distance limited secondary to pain   Stairs            Wheelchair Mobility    Modified Rankin (Stroke Patients Only)       Balance Overall balance assessment: Needs assistance Sitting-balance support: No upper extremity supported;Feet supported Sitting balance-Leahy Scale: Good     Standing balance support: Single extremity supported;During functional activity Standing balance-Leahy Scale: Fair Standing balance comment: Able to maintain static standing without UE support                              Pertinent Vitals/Pain Pain Assessment: 0-10 Pain Score: 10-Worst pain ever (with movement ) Pain Location: back  Pain Descriptors / Indicators: Aching;Sore;Crying Pain Intervention(s): Limited activity within patient's tolerance;Monitored during session;Repositioned    Home Living Family/patient expects to be discharged to:: Private residence Living Arrangements: Children Available Help at Discharge: Family;Available 24 hours/day Type of Home: House Home Access: Stairs to enter Entrance Stairs-Rails: None Entrance Stairs-Number of Steps: 3 Home Layout: One level Home Equipment: None      Prior Function Level of Independence: Independent  Hand Dominance        Extremity/Trunk Assessment   Upper Extremity Assessment Upper Extremity Assessment: Overall WFL  for tasks assessed    Lower Extremity Assessment Lower Extremity Assessment: RLE deficits/detail RLE Deficits / Details: Pain radiating down into RLE at baseline. Reports pain is still there. Decreased weightshift to RLE during ambulation     Cervical / Trunk Assessment Cervical / Trunk Assessment: Other exceptions Cervical / Trunk Exceptions: s/p back surgery   Communication   Communication: No difficulties  Cognition Arousal/Alertness: Awake/alert Behavior During Therapy: WFL for tasks assessed/performed Overall Cognitive Status: Within Functional Limits for tasks assessed                                        General Comments General comments (skin integrity, edema, etc.): Pt tearful during mobility secondary to pain.     Exercises     Assessment/Plan    PT Assessment Patient needs continued PT services  PT Problem List Decreased strength;Decreased activity tolerance;Decreased balance;Decreased mobility;Decreased knowledge of use of DME;Decreased knowledge of precautions;Pain       PT Treatment Interventions DME instruction;Gait training;Stair training;Functional mobility training;Therapeutic activities;Therapeutic exercise;Balance training;Neuromuscular re-education;Patient/family education    PT Goals (Current goals can be found in the Care Plan section)  Acute Rehab PT Goals Patient Stated Goal: to decrease pain  PT Goal Formulation: With patient Time For Goal Achievement: 04/29/17 Potential to Achieve Goals: Good    Frequency Min 5X/week   Barriers to discharge        Co-evaluation               AM-PAC PT "6 Clicks" Daily Activity  Outcome Measure Difficulty turning over in bed (including adjusting bedclothes, sheets and blankets)?: Total Difficulty moving from lying on back to sitting on the side of the bed? : Total Difficulty sitting down on and standing up from a chair with arms (e.g., wheelchair, bedside commode, etc,.)?:  Total Help needed moving to and from a bed to chair (including a wheelchair)?: A Little Help needed walking in hospital room?: A Little Help needed climbing 3-5 steps with a railing? : A Little 6 Click Score: 12    End of Session Equipment Utilized During Treatment: Gait belt Activity Tolerance: Patient limited by pain Patient left: in bed;with call bell/phone within reach Nurse Communication: Mobility status PT Visit Diagnosis: Other abnormalities of gait and mobility (R26.89);Pain Pain - part of body:  (back )    Time: 7416-3845 PT Time Calculation (min) (ACUTE ONLY): 26 min   Charges:   PT Evaluation $PT Eval Low Complexity: 1 Procedure PT Treatments $Gait Training: 8-22 mins   PT G Codes:   PT G-Codes **NOT FOR INPATIENT CLASS** Functional Assessment Tool Used: AM-PAC 6 Clicks Basic Mobility;Clinical judgement Functional Limitation: Mobility: Walking and moving around Mobility: Walking and Moving Around Current Status (X6468): At least 60 percent but less than 80 percent impaired, limited or restricted Mobility: Walking and Moving Around Goal Status [phone]): At least 1 percent but less than 20 percent impaired, limited or restricted    Leighton Ruff, PT, DPT  Acute Rehabilitation Services  Pager: [place] 04/22/2017, 6:54 PM

## 2017-04-22 NOTE — Anesthesia Procedure Notes (Signed)
Procedure Name: Intubation Date/Time: 04/22/2017 7:46 AM Performed by: Tressia Miners LEFFEW Pre-anesthesia Checklist: Patient identified, Patient being monitored, Timeout performed, Emergency Drugs available and Suction available Patient Re-evaluated:Patient Re-evaluated prior to inductionOxygen Delivery Method: Circle System Utilized Preoxygenation: Pre-oxygenation with 100% oxygen Intubation Type: IV induction Ventilation: Mask ventilation without difficulty Laryngoscope Size: Mac and 4 Grade View: Grade II Tube type: Oral Tube size: 7.5 mm Number of attempts: 1 Airway Equipment and Method: Stylet Placement Confirmation: ETT inserted through vocal cords under direct vision,  positive ETCO2 and breath sounds checked- equal and bilateral Secured at: 23 cm Tube secured with: Tape Dental Injury: Teeth and Oropharynx as per pre-operative assessment

## 2017-04-23 ENCOUNTER — Encounter (HOSPITAL_COMMUNITY): Payer: Self-pay | Admitting: Neurological Surgery

## 2017-04-23 DIAGNOSIS — Z79899 Other long term (current) drug therapy: Secondary | ICD-10-CM | POA: Diagnosis not present

## 2017-04-23 DIAGNOSIS — Z6836 Body mass index (BMI) 36.0-36.9, adult: Secondary | ICD-10-CM | POA: Diagnosis not present

## 2017-04-23 DIAGNOSIS — I10 Essential (primary) hypertension: Secondary | ICD-10-CM | POA: Diagnosis not present

## 2017-04-23 DIAGNOSIS — E669 Obesity, unspecified: Secondary | ICD-10-CM | POA: Diagnosis not present

## 2017-04-23 DIAGNOSIS — M5126 Other intervertebral disc displacement, lumbar region: Secondary | ICD-10-CM | POA: Diagnosis not present

## 2017-04-23 DIAGNOSIS — K219 Gastro-esophageal reflux disease without esophagitis: Secondary | ICD-10-CM | POA: Diagnosis not present

## 2017-04-23 MED ORDER — METHOCARBAMOL 500 MG PO TABS: 500.0000 mg | ORAL_TABLET | Freq: Four times a day (QID) | ORAL | 1 refills | Status: DC | PRN

## 2017-04-23 MED ORDER — HYDROCODONE-ACETAMINOPHEN 5-325 MG PO TABS: 1.0000 | ORAL_TABLET | Freq: Four times a day (QID) | ORAL | 0 refills | Status: DC | PRN

## 2017-04-23 MED FILL — HYDROCODON-APAP 5-325: 5-325 | 8 days supply | Qty: 60 | Fill #0

## 2017-04-23 MED FILL — METHOCARBAMOL 500 MG TABLET: 500 | 15 days supply | Qty: 60 | Fill #0

## 2017-04-23 NOTE — Progress Notes (Signed)
Physical Therapy Treatment Patient Details Name: Denise Austin MRN: [id number] DOB: 11-Mar-1971 Today's Date: 04/23/2017    History of Present Illness Pt is 46 y/o female s/p L2-3 extraforaminal microdiscectomy. PMH includes HTN.     PT Comments    Pt progressing well towards physical therapy goals. Was able to improve ambulation distance and overall technique. SPC use improved balance as well. Pt was educated on precautions, stair negotiation, car transfer, walking program, and general safety with mobility progression. Will continue to follow and progress as able per POC.    Follow Up Recommendations  No PT follow up;Supervision for mobility/OOB     Equipment Recommendations  3in1 (PT)    Recommendations for Other Services       Precautions / Restrictions Precautions Precautions: Back Precaution Booklet Issued: Yes (comment) Precaution Comments: Reviewed back precautions with pt and administered handout.  Restrictions Weight Bearing Restrictions: No    Mobility  Bed Mobility Overal bed mobility: Needs Assistance Bed Mobility: Rolling;Sidelying to Sit Rolling: Modified independent (Device/Increase time) Sidelying to sit: Supervision       General bed mobility comments: VC's for proper log roll technique. No assist required.   Transfers Overall transfer level: Needs assistance Equipment used: Straight cane Transfers: Sit to/from Stand Sit to Stand: Min assist         General transfer comment: Pt was able to power-up to full standing position without assistance. VC's for hand placement on seated surface for safety as well as SPC management.   Ambulation/Gait Ambulation/Gait assistance: Supervision Ambulation Distance (Feet): 350 Feet Assistive device: Straight cane Gait Pattern/deviations: Step-through pattern;Decreased stance time - right;Decreased weight shift to right;Antalgic Gait velocity: Decreased Gait velocity interpretation: Below normal speed for  age/gender General Gait Details: Very slow and guarded initially. Was able to improve gait speed with cues. Pt grossly supervision for safety but looked steady and comfortable with the cane.    Stairs Stairs: Yes   Stair Management: One rail Left;With cane;Step to pattern;Forwards Number of Stairs: 4 General stair comments: VC's for sequencing and general safety. Pt progressed to a supervision level with SPC for support and use of rails.   Wheelchair Mobility    Modified Rankin (Stroke Patients Only)       Balance Overall balance assessment: Needs assistance Sitting-balance support: No upper extremity supported;Feet supported Sitting balance-Leahy Scale: Good     Standing balance support: Single extremity supported;During functional activity Standing balance-Leahy Scale: Fair Standing balance comment: Able to maintain static standing without UE support                             Cognition Arousal/Alertness: Awake/alert Behavior During Therapy: WFL for tasks assessed/performed Overall Cognitive Status: Within Functional Limits for tasks assessed                                        Exercises      General Comments        Pertinent Vitals/Pain Pain Assessment: Faces Faces Pain Scale: Hurts little more Pain Location: back  Pain Descriptors / Indicators: Aching;Operative site guarding Pain Intervention(s): Limited activity within patient's tolerance;Monitored during session;Repositioned    Home Living                      Prior Function  PT Goals (current goals can now be found in the care plan section) Acute Rehab PT Goals Patient Stated Goal: to decrease pain  PT Goal Formulation: With patient Time For Goal Achievement: 04/29/17 Potential to Achieve Goals: Good Progress towards PT goals: Progressing toward goals    Frequency    Min 5X/week      PT Plan Current plan remains appropriate     Co-evaluation              AM-PAC PT "6 Clicks" Daily Activity  Outcome Measure  Difficulty turning over in bed (including adjusting bedclothes, sheets and blankets)?: Total Difficulty moving from lying on back to sitting on the side of the bed? : Total Difficulty sitting down on and standing up from a chair with arms (e.g., wheelchair, bedside commode, etc,.)?: Total Help needed moving to and from a bed to chair (including a wheelchair)?: A Little Help needed walking in hospital room?: A Little Help needed climbing 3-5 steps with a railing? : A Little 6 Click Score: 12    End of Session Equipment Utilized During Treatment: Gait belt Activity Tolerance: Patient limited by pain Patient left: in bed;with call bell/phone within reach Nurse Communication: Mobility status PT Visit Diagnosis: Other abnormalities of gait and mobility (R26.89);Pain Pain - part of body:  (back )     Time: 2831-5176 PT Time Calculation (min) (ACUTE ONLY): 23 min  Charges:  $Gait Training: 23-37 mins                    G Codes:       Denise Austin, PT, DPT Acute Rehabilitation Services Pager: [phone]    Denise Austin 04/23/2017, 11:27 AM

## 2017-04-23 NOTE — Progress Notes (Signed)
Patient alert and oriented, mae's well, voiding adequate amount of urine, swallowing without difficulty, c/o mild pain and medication given at time of discharge. Patient discharged home with family. Script and discharged instructions given to patient. Patient and family stated understanding of instructions given. Patient has an appointment with Dr. Ronnald Ramp

## 2017-04-23 NOTE — Discharge Summary (Signed)
Physician Discharge Summary  Patient ID: Denise Austin MRN: [id number] DOB/AGE: August 01, 1971 46 y.o.  Admit date: 04/22/2017 Discharge date: 04/23/2017  Admission Diagnoses: L2-3 HNP    Discharge Diagnoses: same   Discharged Condition: stable  Hospital Course: The patient was admitted on 04/22/2017 and taken to the operating room where the patient underwent R l2-3 diskectomy. The patient tolerated the procedure well and was taken to the recovery room and then to the floor in stable condition. The hospital course was routine. There were no complications. The wound remained clean dry and intact. Pt had appropriate back soreness. No complaints of leg pain or new N/T/W. The patient remained afebrile with stable vital signs, and tolerated a regular diet. The patient continued to increase activities, and pain was well controlled with oral pain medications.   Consults: None  Significant Diagnostic Studies:  Results for orders placed or performed during the hospital encounter of 04/14/17  Surgical pcr screen  Result Value Ref Range   MRSA, PCR NEGATIVE NEGATIVE   Staphylococcus aureus NEGATIVE NEGATIVE  Basic metabolic panel  Result Value Ref Range   Sodium 141 135 - 145 mmol/L   Potassium 3.5 3.5 - 5.1 mmol/L   Chloride 109 101 - 111 mmol/L   CO2 26 22 - 32 mmol/L   Glucose, Bld 124 (H) 65 - 99 mg/dL   BUN 15 6 - 20 mg/dL   Creatinine, Ser 0.87 0.44 - 1.00 mg/dL   Calcium 9.2 8.9 - 10.3 mg/dL   GFR calc non Af Amer >60 >60 mL/min   GFR calc Af Amer >60 >60 mL/min   Anion gap 6 5 - 15  CBC WITH DIFFERENTIAL  Result Value Ref Range   WBC 7.7 4.0 - 10.5 K/uL   RBC 4.38 3.87 - 5.11 MIL/uL   Hemoglobin 13.8 12.0 - 15.0 g/dL   HCT 39.7 36.0 - 46.0 %   MCV 90.6 78.0 - 100.0 fL   MCH 31.5 26.0 - 34.0 pg   MCHC 34.8 30.0 - 36.0 g/dL   RDW 12.8 11.5 - 15.5 %   Platelets 295 150 - 400 K/uL   Neutrophils Relative % 62 %   Neutro Abs 4.9 1.7 - 7.7 K/uL   Lymphocytes Relative 29 %   Lymphs Abs 2.2 0.7 - 4.0 K/uL   Monocytes Relative 7 %   Monocytes Absolute 0.5 0.1 - 1.0 K/uL   Eosinophils Relative 2 %   Eosinophils Absolute 0.1 0.0 - 0.7 K/uL   Basophils Relative 0 %   Basophils Absolute 0.0 0.0 - 0.1 K/uL  Protime-INR  Result Value Ref Range   Prothrombin Time 13.2 11.4 - 15.2 seconds   INR 1.00   hCG, serum, qualitative  Result Value Ref Range   Preg, Serum NEGATIVE NEGATIVE    Chest 2 View  Result Date: 04/14/2017 CLINICAL DATA:  Preop for microdiskectomy EXAM: CHEST  2 VIEW COMPARISON:  None. FINDINGS: No active infiltrate or effusion is seen. Mediastinal and hilar contours are unremarkable. The heart is within limits in size. No bony abnormality is seen surgical clips are present in the right upper quadrant from prior cholecystectomy. IMPRESSION: No active cardiopulmonary disease. Electronically Signed   By: Ivar Drape M.D.   On: 04/14/2017 09:07   Dg Lumbar Spine 2-3 Views  Result Date: 04/22/2017 CLINICAL DATA:  Extraforaminal Microdiscectomy - Lumbar two-Lumbar three - right EXAM: LUMBAR SPINE - 2-3 VIEW COMPARISON:  MR 12/09/2016 FINDINGS: Two intraoperative lumbar radiograph submitted. First demonstrates needle from posterior  approach at the level of the L2-3 interspace. Second demonstrates metallic probe from posterior approach tip projecting below the L2 pedicle. IMPRESSION: Intraoperative localization. Electronically Signed   By: Lucrezia Europe M.D.   On: 04/22/2017 08:39    Antibiotics:  Anti-infectives    Start     Dose/Rate Route Frequency Ordered Stop   04/23/17 1000  valACYclovir (VALTREX) tablet 500 mg     500 mg Oral Daily 04/22/17 1445     04/22/17 1500  ceFAZolin (ANCEF) IVPB 2g/100 mL premix     2 g 200 mL/hr over 30 Minutes Intravenous Every 8 hours 04/22/17 1446 04/22/17 2145   04/22/17 0816  bacitracin 50,000 Units in sodium chloride irrigation 0.9 % 500 mL irrigation  Status:  Discontinued       As needed 04/22/17 0818 04/22/17 0934    04/22/17 0633  ceFAZolin (ANCEF) IVPB 2g/100 mL premix     2 g 200 mL/hr over 30 Minutes Intravenous On call to O.R. 04/22/17 1610 04/22/17 0752      Discharge Exam: Blood pressure 107/66, pulse 64, temperature 97.7 F (36.5 C), temperature source Oral, resp. rate 18, height 5\' 5"  (1.651 m), weight 98.4 kg (217 lb), last menstrual period 04/05/2017, SpO2 (!) 79 %. Neurologic: Grossly normal Dressing dry  Discharge Medications:   Allergies as of 04/23/2017      Reactions   Oxycodone-acetaminophen Nausea And Vomiting, Rash      Medication List    TAKE these medications   amLODipine 10 MG tablet Commonly known as:  NORVASC Take 1 tablet (10 mg total) by mouth daily.   HYDROcodone-acetaminophen 5-325 MG tablet Commonly known as:  NORCO/VICODIN Take 1-2 tablets by mouth every 6 (six) hours as needed for moderate pain. What changed:  how much to take  when to take this   ibuprofen 200 MG tablet Commonly known as:  ADVIL,MOTRIN Take 600-800 mg by mouth every 8 (eight) hours as needed for moderate pain.   methocarbamol 500 MG tablet Commonly known as:  ROBAXIN Take 1 tablet (500 mg total) by mouth every 6 (six) hours as needed for muscle spasms.   metoprolol succinate 25 MG 24 hr tablet Commonly known as:  TOPROL-XL Take 1 tablet (25 mg total) by mouth daily.   omeprazole 40 MG capsule Commonly known as:  PRILOSEC Take 1 capsule (40 mg total) by mouth daily.   valACYclovir 500 MG tablet Commonly known as:  VALTREX Take 1 tablet (500 mg total) by mouth daily.            Durable Medical Equipment        Start     Ordered   04/22/17 2012  For home use only DME 3 n 1  Once     04/22/17 2012      Disposition: home   Final Dx: R L2-3 extraforaminal diskectomy  Discharge Instructions     Remove dressing in 72 hours    Complete by:  As directed    Call MD for:  difficulty breathing, headache or visual disturbances    Complete by:  As directed    Call MD  for:  persistant nausea and vomiting    Complete by:  As directed    Call MD for:  redness, tenderness, or signs of infection (pain, swelling, redness, odor or green/yellow discharge around incision site)    Complete by:  As directed    Call MD for:  severe uncontrolled pain    Complete by:  As  directed    Call MD for:  temperature >100.4    Complete by:  As directed    Diet - low sodium heart healthy    Complete by:  As directed    Increase activity slowly    Complete by:  As directed          Signed: Karmah Potocki S 04/23/2017, 9:09 AM

## 2017-04-24 HISTORY — PX: OTHER SURGICAL HISTORY: SHX169

## 2017-04-27 DIAGNOSIS — R269 Unspecified abnormalities of gait and mobility: Secondary | ICD-10-CM | POA: Diagnosis not present

## 2017-05-05 MED FILL — OXYCODONE W/APAP 5/325 TAB: 5-325 | 8 days supply | Qty: 50 | Fill #0

## 2017-05-05 MED FILL — GABAPENTIN 300 MG CAPSULE: 300 | 30 days supply | Qty: 90 | Fill #0

## 2017-05-12 DIAGNOSIS — Z0000 Encounter for general adult medical examination without abnormal findings: Secondary | ICD-10-CM | POA: Diagnosis not present

## 2017-05-12 DIAGNOSIS — Z124 Encounter for screening for malignant neoplasm of cervix: Secondary | ICD-10-CM | POA: Diagnosis not present

## 2017-05-12 DIAGNOSIS — Z6836 Body mass index (BMI) 36.0-36.9, adult: Secondary | ICD-10-CM | POA: Diagnosis not present

## 2017-05-12 LAB — HM PAP SMEAR: HM Pap smear: NORMAL

## 2017-05-15 DIAGNOSIS — Z760 Encounter for issue of repeat prescription: Secondary | ICD-10-CM | POA: Diagnosis not present

## 2017-05-18 ENCOUNTER — Other Ambulatory Visit: Payer: Self-pay | Admitting: Neurological Surgery

## 2017-05-20 ENCOUNTER — Other Ambulatory Visit (HOSPITAL_COMMUNITY): Payer: Self-pay | Admitting: *Deleted

## 2017-05-20 ENCOUNTER — Encounter (HOSPITAL_COMMUNITY): Payer: Self-pay

## 2017-05-20 ENCOUNTER — Encounter (HOSPITAL_COMMUNITY)
Admission: RE | Admit: 2017-05-20 | Discharge: 2017-05-20 | Disposition: A | Discharge status: Home / Self Care | Payer: 59 | Source: Ambulatory Visit | Attending: Neurological Surgery | Admitting: Neurological Surgery

## 2017-05-20 DIAGNOSIS — Z6836 Body mass index (BMI) 36.0-36.9, adult: Secondary | ICD-10-CM | POA: Insufficient documentation

## 2017-05-20 DIAGNOSIS — K219 Gastro-esophageal reflux disease without esophagitis: Secondary | ICD-10-CM | POA: Insufficient documentation

## 2017-05-20 DIAGNOSIS — I10 Essential (primary) hypertension: Secondary | ICD-10-CM | POA: Insufficient documentation

## 2017-05-20 DIAGNOSIS — T8130XA Disruption of wound, unspecified, initial encounter: Secondary | ICD-10-CM | POA: Diagnosis not present

## 2017-05-20 DIAGNOSIS — Z79899 Other long term (current) drug therapy: Secondary | ICD-10-CM | POA: Insufficient documentation

## 2017-05-20 LAB — CBC
HCT: 38.2 % (ref 36.0–46.0)
Hemoglobin: 13.1 g/dL (ref 12.0–15.0)
MCH: 30.8 pg (ref 26.0–34.0)
MCHC: 34.3 g/dL (ref 30.0–36.0)
MCV: 89.7 fL (ref 78.0–100.0)
Platelets: 306 10*3/uL (ref 150–400)
RBC: 4.26 MIL/uL (ref 3.87–5.11)
RDW: 12.5 % (ref 11.5–15.5)
WBC: 6.5 10*3/uL (ref 4.0–10.5)

## 2017-05-20 LAB — HCG, SERUM, QUALITATIVE: Preg, Serum: NEGATIVE

## 2017-05-20 LAB — SURGICAL PCR SCREEN
MRSA, PCR: NEGATIVE
Staphylococcus aureus: NEGATIVE

## 2017-05-20 MED FILL — DOXYCYCLINE HYCLATE 100 MG: 100 | 10 days supply | Qty: 20 | Fill #0

## 2017-05-20 NOTE — Pre-Procedure Instructions (Signed)
Denise Austin  05/20/2017    Your procedure is scheduled on Friday, May 22, 2017 at 2:20 PM.   Report to [facility] Entrance "A" Admitting Office at 12:20 PM.   Call this number if you have problems the morning of surgery: [phone]   Questions prior to day of surgery, please call [phone] between 8 & 4 PM.   Remember:  Do not eat food or drink liquids after midnight Thursday, 05/21/17.  Take these medicines the morning of surgery with A SIP OF WATER: Amlodipine (Norvasc), Gabapentin (Neurontin), Metoprolol (Toprol XL), Omeprazole (Prilosec), Oxycodone - if needed.   Do not wear jewelry, make-up or nail polish.  Do not wear lotions, powders, perfumes or deodorant.  Do not shave 48 hours prior to surgery.    Do not bring valuables to the hospital.  [facility] is not responsible for any belongings or valuables.  Contacts, dentures or bridgework may not be worn into surgery.  Leave your suitcase in the car.  After surgery it may be brought to your room.  For patients admitted to the hospital, discharge time will be determined by your treatment team.  [facility] - Preparing for Surgery  Before surgery, you can play an important role.  Because skin is not sterile, your skin needs to be as free of germs as possible.  You can reduce the number of germs on you skin by washing with CHG (chlorahexidine gluconate) soap before surgery.  CHG is an antiseptic cleaner which kills germs and bonds with the skin to continue killing germs even after washing.  Please DO NOT use if you have an allergy to CHG or antibacterial soaps.  If your skin becomes reddened/irritated stop using the CHG and inform your nurse when you arrive at Short Stay.  Do not shave (including legs and underarms) for at least 48 hours prior to the first CHG shower.  You may shave your face.  Please follow these instructions carefully:   1.  Shower with CHG Soap the night before surgery and the                     morning of Surgery.  2.  If you choose to wash your hair, wash your hair first as usual with your       normal shampoo.  3.  After you shampoo, rinse your hair and body thoroughly to remove the shampoo.  4.  Use CHG as you would any other liquid soap.  You can apply chg directly       to the skin and wash gently with scrungie or a clean washcloth.  5.  Apply the CHG Soap to your body ONLY FROM THE NECK DOWN.        Do not use on open wounds or open sores.  Avoid contact with your eyes, ears, mouth and genitals (private parts).  Wash genitals (private parts) with your normal soap.  6.  Wash thoroughly, paying special attention to the area where your surgery        will be performed.  7.  Thoroughly rinse your body with warm water from the neck down.  8.  DO NOT shower/wash with your normal soap after using and rinsing off       the CHG Soap.  9.  Pat yourself dry with a clean towel.            10.  Wear clean pajamas.  11.  Place clean sheets on your bed the night of your first shower and do not        sleep with pets.  Day of Surgery  Do not apply any lotions/deodorants the morning of surgery.  Please wear clean clothes to the hospital.   Please read over the fact sheets that you were given.

## 2017-05-21 ENCOUNTER — Other Ambulatory Visit: Payer: Self-pay | Admitting: *Deleted

## 2017-05-21 MED ORDER — CEFAZOLIN SODIUM-DEXTROSE 2-4 GM/100ML-% IV SOLN
2.0000 g | INTRAVENOUS | Status: AC
  Administered 2017-05-22: 2 g via INTRAVENOUS
  Filled 2017-05-21: qty 100

## 2017-05-21 NOTE — Patient Outreach (Signed)
[facility]) Care Management  05/21/2017  Denise Austin July 27, 1971 [id number]   Subjective: Telephone call to patient's home / mobile number, spoke with patient, and HIPAA verified.  Discussed [facility] Care Management [facility] Transition of care follow up, preoperative call follow up, patient voiced understanding, and is in agreement to both types of follow up.  Patient states she is doing well, ready for surgery today, and only has a minutes to talk, currently in the middle of something. Patient voices understanding of medical diagnosis and treatment plan.  States she is accessing the following [facility] benefits: outpatient pharmacy, hospital indemnity, andfamily medical leave act (FMLA) already in place. States she is having wound revision at [facility] on 05/22/17 related to previous surgery. Patient states she does not have any preoperative questions, care coordination, disease management, disease monitoring, transportation, community resource, or pharmacy needs at this time.    Objective: Per chart review, patient to be admitted on 05/16/17 for wound revision.   Patient hospitalized  04/23/27 - 04/23/17 for Right L2-3 extraforaminal disc protrusion with back and right thigh pain.   Status post right L2-3 extraforaminal microdiscectomy utilizing microscopic dissection on 04/22/17.    Assessment: Received [facility] Preoperative Call Follow up referral on 05/21/17.  Preoperative call completed, and transition of care follow up pending notification of patient discharge.   Plan: RNCM will call patient for  telephone outreach attempt, transition of care follow up, within 3 business days of hospital discharge notification.    Sumiya Mamaril H. Annia Friendly, BSN, [place] [facility] Telephonic CM Phone: [phone] Fax: [phone]

## 2017-05-22 ENCOUNTER — Ambulatory Visit (HOSPITAL_COMMUNITY)
Admission: RE | Admit: 2017-05-22 | Discharge: 2017-05-22 | Disposition: A | Discharge status: Home / Self Care | Payer: 59 | Source: Ambulatory Visit | Attending: Neurological Surgery | Admitting: Neurological Surgery

## 2017-05-22 ENCOUNTER — Inpatient Hospital Stay (HOSPITAL_COMMUNITY): Payer: 59 | Admitting: Certified Registered"

## 2017-05-22 ENCOUNTER — Encounter (HOSPITAL_COMMUNITY)
Admission: RE | Disposition: A | Discharge status: Home / Self Care | Payer: Self-pay | Source: Ambulatory Visit | Attending: Neurological Surgery

## 2017-05-22 ENCOUNTER — Encounter (HOSPITAL_COMMUNITY): Payer: Self-pay | Admitting: Neurological Surgery

## 2017-05-22 DIAGNOSIS — Z6836 Body mass index (BMI) 36.0-36.9, adult: Secondary | ICD-10-CM | POA: Diagnosis not present

## 2017-05-22 DIAGNOSIS — T8132XA Disruption of internal operation (surgical) wound, not elsewhere classified, initial encounter: Secondary | ICD-10-CM | POA: Diagnosis not present

## 2017-05-22 DIAGNOSIS — R739 Hyperglycemia, unspecified: Secondary | ICD-10-CM | POA: Diagnosis not present

## 2017-05-22 DIAGNOSIS — T8131XA Disruption of external operation (surgical) wound, not elsewhere classified, initial encounter: Secondary | ICD-10-CM | POA: Diagnosis not present

## 2017-05-22 DIAGNOSIS — Z79899 Other long term (current) drug therapy: Secondary | ICD-10-CM | POA: Diagnosis not present

## 2017-05-22 DIAGNOSIS — K219 Gastro-esophageal reflux disease without esophagitis: Secondary | ICD-10-CM | POA: Diagnosis not present

## 2017-05-22 DIAGNOSIS — T8130XA Disruption of wound, unspecified, initial encounter: Secondary | ICD-10-CM | POA: Diagnosis not present

## 2017-05-22 DIAGNOSIS — I10 Essential (primary) hypertension: Secondary | ICD-10-CM | POA: Diagnosis not present

## 2017-05-22 HISTORY — DX: Other specified postprocedural states: Z98.890

## 2017-05-22 HISTORY — DX: Other specified postprocedural states: R11.2

## 2017-05-22 HISTORY — PX: WOUND EXPLORATION: SHX6188

## 2017-05-22 SURGERY — WOUND EXPLORATION
Anesthesia: General | Site: Spine Lumbar

## 2017-05-22 MED ORDER — THROMBIN 5000 UNITS EX SOLR
CUTANEOUS | Status: AC
  Filled 2017-05-22: qty 15000

## 2017-05-22 MED ORDER — SCOPOLAMINE 1 MG/3DAYS TD PT72
MEDICATED_PATCH | TRANSDERMAL | Status: AC
  Filled 2017-05-22: qty 1

## 2017-05-22 MED ORDER — PHENYLEPHRINE 40 MCG/ML (10ML) SYRINGE FOR IV PUSH (FOR BLOOD PRESSURE SUPPORT)
PREFILLED_SYRINGE | INTRAVENOUS | Status: AC
  Filled 2017-05-22: qty 10

## 2017-05-22 MED ORDER — PROPOFOL 10 MG/ML IV BOLUS
INTRAVENOUS | Status: AC
  Filled 2017-05-22: qty 20

## 2017-05-22 MED ORDER — SUGAMMADEX SODIUM 200 MG/2ML IV SOLN
INTRAVENOUS | Status: AC
  Filled 2017-05-22: qty 2

## 2017-05-22 MED ORDER — LIDOCAINE 2% (20 MG/ML) 5 ML SYRINGE
INTRAMUSCULAR | Status: DC | PRN
  Administered 2017-05-22: 30 mg via INTRAVENOUS

## 2017-05-22 MED ORDER — ROCURONIUM BROMIDE 10 MG/ML (PF) SYRINGE
PREFILLED_SYRINGE | INTRAVENOUS | Status: AC
  Filled 2017-05-22: qty 5

## 2017-05-22 MED ORDER — LIDOCAINE 2% (20 MG/ML) 5 ML SYRINGE
INTRAMUSCULAR | Status: AC
  Filled 2017-05-22: qty 5

## 2017-05-22 MED ORDER — PROMETHAZINE HCL 25 MG/ML IJ SOLN: 6.2500 mg | INTRAMUSCULAR | Status: DC | PRN

## 2017-05-22 MED ORDER — OXYCODONE-ACETAMINOPHEN 5-325 MG PO TABS: 1.0000 | ORAL_TABLET | ORAL | Status: DC | PRN

## 2017-05-22 MED ORDER — MIDAZOLAM HCL 2 MG/2ML IJ SOLN: 0.5000 mg | Freq: Once | INTRAMUSCULAR | Status: DC | PRN

## 2017-05-22 MED ORDER — SCOPOLAMINE 1 MG/3DAYS TD PT72
1.0000 | MEDICATED_PATCH | TRANSDERMAL | Status: DC
  Administered 2017-05-22: 1.5 mg via TRANSDERMAL

## 2017-05-22 MED ORDER — SODIUM CHLORIDE 0.9 % IJ SOLN
INTRAMUSCULAR | Status: AC
  Filled 2017-05-22: qty 10

## 2017-05-22 MED ORDER — PHENYLEPHRINE 40 MCG/ML (10ML) SYRINGE FOR IV PUSH (FOR BLOOD PRESSURE SUPPORT)
PREFILLED_SYRINGE | INTRAVENOUS | Status: DC | PRN
  Administered 2017-05-22: 80 ug via INTRAVENOUS

## 2017-05-22 MED ORDER — CHLORHEXIDINE GLUCONATE CLOTH 2 % EX PADS: 6.0000 | MEDICATED_PAD | Freq: Once | CUTANEOUS | Status: DC

## 2017-05-22 MED ORDER — 0.9 % SODIUM CHLORIDE (POUR BTL) OPTIME
TOPICAL | Status: DC | PRN
  Administered 2017-05-22: 1000 mL

## 2017-05-22 MED ORDER — BUPIVACAINE HCL (PF) 0.25 % IJ SOLN
INTRAMUSCULAR | Status: DC | PRN
  Administered 2017-05-22: 10 mL

## 2017-05-22 MED ORDER — VANCOMYCIN HCL 1000 MG IV SOLR
INTRAVENOUS | Status: DC | PRN
  Administered 2017-05-22: 500 mg via TOPICAL

## 2017-05-22 MED ORDER — ONDANSETRON HCL 4 MG/2ML IJ SOLN
INTRAMUSCULAR | Status: DC | PRN
  Administered 2017-05-22: 4 mg via INTRAVENOUS

## 2017-05-22 MED ORDER — SUFENTANIL CITRATE 50 MCG/ML IV SOLN
INTRAVENOUS | Status: DC | PRN
Start: 2017-05-22 — End: 2017-05-22
  Administered 2017-05-22: 10 ug via INTRAVENOUS

## 2017-05-22 MED ORDER — BUPIVACAINE HCL (PF) 0.25 % IJ SOLN
INTRAMUSCULAR | Status: AC
  Filled 2017-05-22: qty 30

## 2017-05-22 MED ORDER — MIDAZOLAM HCL 2 MG/2ML IJ SOLN
INTRAMUSCULAR | Status: AC
  Filled 2017-05-22: qty 2

## 2017-05-22 MED ORDER — DEXAMETHASONE SODIUM PHOSPHATE 10 MG/ML IJ SOLN
INTRAMUSCULAR | Status: DC | PRN
  Administered 2017-05-22: 10 mg via INTRAVENOUS

## 2017-05-22 MED ORDER — SODIUM CHLORIDE 0.9 % IR SOLN
Status: DC | PRN
  Administered 2017-05-22: 13:00:00

## 2017-05-22 MED ORDER — ROCURONIUM BROMIDE 10 MG/ML (PF) SYRINGE
PREFILLED_SYRINGE | INTRAVENOUS | Status: DC | PRN
  Administered 2017-05-22: 40 mg via INTRAVENOUS

## 2017-05-22 MED ORDER — MIDAZOLAM HCL 5 MG/5ML IJ SOLN
INTRAMUSCULAR | Status: DC | PRN
  Administered 2017-05-22: 2 mg via INTRAVENOUS

## 2017-05-22 MED ORDER — ONDANSETRON HCL 4 MG/2ML IJ SOLN
INTRAMUSCULAR | Status: AC
  Filled 2017-05-22: qty 2

## 2017-05-22 MED ORDER — HYDROMORPHONE HCL 1 MG/ML IJ SOLN: 0.2500 mg | INTRAMUSCULAR | Status: DC | PRN

## 2017-05-22 MED ORDER — LACTATED RINGERS IV SOLN
INTRAVENOUS | Status: DC
  Administered 2017-05-22: 12:00:00 via INTRAVENOUS

## 2017-05-22 MED ORDER — DEXAMETHASONE SODIUM PHOSPHATE 10 MG/ML IJ SOLN
INTRAMUSCULAR | Status: AC
  Filled 2017-05-22: qty 1

## 2017-05-22 MED ORDER — PROPOFOL 10 MG/ML IV BOLUS
INTRAVENOUS | Status: DC | PRN
  Administered 2017-05-22: 150 mg via INTRAVENOUS

## 2017-05-22 MED ORDER — SUFENTANIL CITRATE 50 MCG/ML IV SOLN
INTRAVENOUS | Status: AC
Start: 2017-05-22 — End: 2017-05-22
  Filled 2017-05-22: qty 1

## 2017-05-22 MED ORDER — SUGAMMADEX SODIUM 200 MG/2ML IV SOLN
INTRAVENOUS | Status: DC | PRN
  Administered 2017-05-22: 200 mg via INTRAVENOUS

## 2017-05-22 MED ORDER — MEPERIDINE HCL 25 MG/ML IJ SOLN: 6.2500 mg | INTRAMUSCULAR | Status: DC | PRN

## 2017-05-22 MED ORDER — VANCOMYCIN HCL 1000 MG IV SOLR
INTRAVENOUS | Status: AC
  Filled 2017-05-22: qty 1000

## 2017-05-22 SURGICAL SUPPLY — 45 items
BAG DECANTER FOR FLEXI CONT (MISCELLANEOUS) ×4 IMPLANT
BENZOIN TINCTURE PRP APPL 2/3 (GAUZE/BANDAGES/DRESSINGS) ×2 IMPLANT
CANISTER SUCT 3000ML PPV (MISCELLANEOUS) ×2 IMPLANT
CARTRIDGE OIL MAESTRO DRILL (MISCELLANEOUS) ×1 IMPLANT
DERMABOND ADVANCED (GAUZE/BANDAGES/DRESSINGS) ×1
DERMABOND ADVANCED .7 DNX12 (GAUZE/BANDAGES/DRESSINGS) ×1 IMPLANT
DIFFUSER DRILL AIR PNEUMATIC (MISCELLANEOUS) ×2 IMPLANT
DRAPE LAPAROTOMY 100X72 PEDS (DRAPES) IMPLANT
DRAPE LAPAROTOMY 100X72X124 (DRAPES) IMPLANT
DRAPE POUCH INSTRU U-SHP 10X18 (DRAPES) ×2 IMPLANT
DRSG OPSITE POSTOP 3X4 (GAUZE/BANDAGES/DRESSINGS) ×2 IMPLANT
DURAPREP 26ML APPLICATOR (WOUND CARE) IMPLANT
DURAPREP 6ML APPLICATOR 50/CS (WOUND CARE) IMPLANT
ELECT REM PT RETURN 9FT ADLT (ELECTROSURGICAL) ×2
ELECTRODE REM PT RTRN 9FT ADLT (ELECTROSURGICAL) ×1 IMPLANT
GAUZE SPONGE 4X4 16PLY XRAY LF (GAUZE/BANDAGES/DRESSINGS) IMPLANT
GLOVE BIO SURGEON STRL SZ7 (GLOVE) IMPLANT
GLOVE BIO SURGEON STRL SZ8 (GLOVE) ×2 IMPLANT
GLOVE BIOGEL PI IND STRL 7.0 (GLOVE) IMPLANT
GLOVE BIOGEL PI INDICATOR 7.0 (GLOVE)
GOWN STRL REUS W/ TWL LRG LVL3 (GOWN DISPOSABLE) IMPLANT
GOWN STRL REUS W/ TWL XL LVL3 (GOWN DISPOSABLE) IMPLANT
GOWN STRL REUS W/TWL 2XL LVL3 (GOWN DISPOSABLE) ×2 IMPLANT
GOWN STRL REUS W/TWL LRG LVL3 (GOWN DISPOSABLE)
GOWN STRL REUS W/TWL XL LVL3 (GOWN DISPOSABLE)
KIT BASIN OR (CUSTOM PROCEDURE TRAY) ×2 IMPLANT
KIT ROOM TURNOVER OR (KITS) ×2 IMPLANT
NEEDLE HYPO 18GX1.5 BLUNT FILL (NEEDLE) IMPLANT
NEEDLE HYPO 25X1 1.5 SAFETY (NEEDLE) ×2 IMPLANT
NEEDLE SPNL 20GX3.5 QUINCKE YW (NEEDLE) IMPLANT
NS IRRIG 1000ML POUR BTL (IV SOLUTION) ×2 IMPLANT
OIL CARTRIDGE MAESTRO DRILL (MISCELLANEOUS) ×2
PACK LAMINECTOMY NEURO (CUSTOM PROCEDURE TRAY) ×2 IMPLANT
PAD ARMBOARD 7.5X6 YLW CONV (MISCELLANEOUS) ×6 IMPLANT
STRIP CLOSURE SKIN 1/2X4 (GAUZE/BANDAGES/DRESSINGS) ×2 IMPLANT
SUT VIC AB 0 CT1 18XCR BRD8 (SUTURE) ×1 IMPLANT
SUT VIC AB 0 CT1 8-18 (SUTURE) ×1
SUT VIC AB 2-0 CP2 18 (SUTURE) ×2 IMPLANT
SUT VIC AB 3-0 SH 8-18 (SUTURE) ×2 IMPLANT
SWAB COLLECTION DEVICE MRSA (MISCELLANEOUS) ×2 IMPLANT
SWAB CULTURE ESWAB REG 1ML (MISCELLANEOUS) ×2 IMPLANT
SYR 3ML LL SCALE MARK (SYRINGE) IMPLANT
TOWEL GREEN STERILE (TOWEL DISPOSABLE) ×2 IMPLANT
TOWEL GREEN STERILE FF (TOWEL DISPOSABLE) ×2 IMPLANT
WATER STERILE IRR 1000ML POUR (IV SOLUTION) ×2 IMPLANT

## 2017-05-22 NOTE — Anesthesia Procedure Notes (Signed)
Procedure Name: Intubation Date/Time: 05/22/2017 12:36 PM Performed by: Melina Copa, Carrington Mullenax R Pre-anesthesia Checklist: Patient identified, Emergency Drugs available, Suction available and Patient being monitored Patient Re-evaluated:Patient Re-evaluated prior to induction Oxygen Delivery Method: Circle System Utilized Preoxygenation: Pre-oxygenation with 100% oxygen Induction Type: IV induction Ventilation: Mask ventilation without difficulty Laryngoscope Size: Mac and 3 Grade View: Grade II Tube type: Oral Tube size: 7.5 mm Number of attempts: 1 Airway Equipment and Method: Stylet Placement Confirmation: ETT inserted through vocal cords under direct vision,  positive ETCO2 and breath sounds checked- equal and bilateral Secured at: 22 cm Tube secured with: Tape Dental Injury: Teeth and Oropharynx as per pre-operative assessment

## 2017-05-22 NOTE — Anesthesia Postprocedure Evaluation (Signed)
Anesthesia Post Note  Patient: ROSALIE BUENAVENTURA  Procedure(s) Performed: Procedure(s) (LRB): Revision Of Lumbar Wound (N/A)     Patient location during evaluation: PACU Anesthesia Type: General Level of consciousness: awake and alert, patient cooperative and oriented Pain management: pain level controlled Vital Signs Assessment: post-procedure vital signs reviewed and stable Respiratory status: spontaneous breathing, nonlabored ventilation and respiratory function stable Cardiovascular status: blood pressure returned to baseline and stable Postop Assessment: no signs of nausea or vomiting Anesthetic complications: no Comments: Pt received Sugammadex reversal, however, does not use hormonal contraception    Last Vitals:  Vitals:   05/22/17 1417 05/22/17 1430  BP: 125/86 120/81  Pulse: 75 76  Resp: 10 12  Temp:      Last Pain:  Vitals:   05/22/17 1430  TempSrc:   PainSc: 0-No pain                 Roizy Harold,E. Chloeann Alfred

## 2017-05-22 NOTE — H&P (Signed)
Subjective: Patient is a 46 y.o. female admitted for lumbar wound dehiscence. Onset of symptoms was several days ago, unchanged since that time.  The pain is rated mild, and is located at the across the lower back. The pain is described as aching and occurs intermittently. The symptoms have not been progressive. Symptoms are exacerbated by exercise.    Past Medical History:  Diagnosis Date  . GERD (gastroesophageal reflux disease)   . Hypertension   . Uterine fibroid     Past Surgical History:  Procedure Laterality Date  . BUNIONECTOMY    . CHOLECYSTECTOMY    . LUMBAR LAMINECTOMY/DECOMPRESSION MICRODISCECTOMY Right 04/22/2017   Procedure: Extraforaminal Microdiscectomy - Lumbar two-Lumbar three - right;  Surgeon: Eustace Moore, MD;  Location: [place];  Service: Neurosurgery;  Laterality: Right;    Prior to Admission medications   Medication Sig Start Date End Date Taking? Authorizing Provider  amLODipine (NORVASC) 10 MG tablet Take 1 tablet (10 mg total) by mouth daily. 01/16/17  Yes Debbrah Alar, NP  bacitracin-polymyxin b (POLYSPORIN) ointment Apply 1 application topically 2 (two) times daily.   Yes [provider]  BIOTIN PO Take 1 tablet by mouth daily. Gummy vitamin   Yes [provider]  gabapentin (NEURONTIN) 300 MG capsule Take 300 mg by mouth 2 (two) times daily. 05/05/17  Yes [provider]  methocarbamol (ROBAXIN) 500 MG tablet Take 1 tablet (500 mg total) by mouth every 6 (six) hours as needed for muscle spasms. 04/23/17  Yes Eustace Moore, MD  metoprolol succinate (TOPROL-XL) 25 MG 24 hr tablet Take 1 tablet (25 mg total) by mouth daily. 01/16/17  Yes Debbrah Alar, NP  omeprazole (PRILOSEC) 40 MG capsule Take 1 capsule (40 mg total) by mouth daily. 03/04/17  Yes Debbrah Alar, NP  oxyCODONE-acetaminophen (PERCOCET/ROXICET) 5-325 MG tablet Take 1 tablet by mouth every 4 (four) hours as needed. pain 05/05/17  Yes [provider]   valACYclovir (VALTREX) 500 MG tablet Take 1 tablet (500 mg total) by mouth daily. Patient taking differently: Take 500 mg by mouth at bedtime.  01/16/17  Yes Debbrah Alar, NP  doxycycline (VIBRAMYCIN) 100 MG capsule Take 100 mg by mouth 2 (two) times daily.    [provider]  HYDROcodone-acetaminophen (NORCO/VICODIN) 5-325 MG tablet Take 1-2 tablets by mouth every 6 (six) hours as needed for moderate pain. Patient not taking: Reported on 05/19/2017 04/23/17   Eustace Moore, MD   Allergies  Allergen Reactions  . Oxycodone-Acetaminophen Nausea And Vomiting and Rash    PT STATES SHE CAN TOLERATE    Social History  Substance Use Topics  . Smoking status: Never Smoker  . Smokeless tobacco: Never Used  . Alcohol use 0.0 oz/week     Comment: occasional 1-2 times a month    Family History  Problem Relation Age of Onset  . Hypertension Mother   . Diabetes Mother   . Hypertension Father        smoker  . COPD Father   . Single kidney Sister        removed as a child  . Heart attack Neg Hx   . Hyperlipidemia Neg Hx   . Sudden death Neg Hx      Review of Systems  Positive ROS: neg  All other systems have been reviewed and were otherwise negative with the exception of those mentioned in the HPI and as above.  Objective: Vital signs in last 24 hours:    General Appearance: Alert,  cooperative, no distress, appears stated age Head: Normocephalic, without obvious abnormality, atraumatic Eyes: PERRL, conjunctiva/corneas clear, EOM's intact    Neck: Supple, symmetrical, trachea midline Back: Symmetric, no curvature, ROM normal, no CVA tenderness Lungs:  respirations unlabored Heart: Regular rate and rhythm Abdomen: Soft, non-tender Extremities: Extremities normal, atraumatic, no cyanosis or edema Pulses: 2+ and symmetric all extremities Skin: Skin color, texture, turgor normal, no rashes or lesions  NEUROLOGIC:   Mental status: Alert and oriented x4,  no aphasia,  good attention span, fund of knowledge, and memory Motor Exam - grossly normal Sensory Exam - grossly normal Reflexes: 1+ Coordination - grossly normal Gait - grossly normal Balance - grossly normal Cranial Nerves: I: smell Not tested  II: visual acuity  OS: nl    OD: nl  II: visual fields Full to confrontation  II: pupils Equal, round, reactive to light  III,VII: ptosis None  III,IV,VI: extraocular muscles  Full ROM  V: mastication Normal  V: facial light touch sensation  Normal  V,VII: corneal reflex  Present  VII: facial muscle function - upper  Normal  VII: facial muscle function - lower Normal  VIII: hearing Not tested  IX: soft palate elevation  Normal  IX,X: gag reflex Present  XI: trapezius strength  5/5  XI: sternocleidomastoid strength 5/5  XI: neck flexion strength  5/5  XII: tongue strength  Normal    Data Review Lab Results  Component Value Date   WBC 6.5 05/20/2017   HGB 13.1 05/20/2017   HCT 38.2 05/20/2017   MCV 89.7 05/20/2017   PLT 306 05/20/2017   Lab Results  Component Value Date   NA 141 04/14/2017   K 3.5 04/14/2017   CL 109 04/14/2017   CO2 26 04/14/2017   BUN 15 04/14/2017   CREATININE 0.87 04/14/2017   GLUCOSE 124 (H) 04/14/2017   Lab Results  Component Value Date   INR 1.00 04/14/2017    Assessment/Plan: Patient admitted for wound revision. Patient has failed a reasonable attempt at conservative therapy.  I explained the condition and procedure to the patient and answered any questions.  Patient wishes to proceed with procedure as planned. Understands risks/ benefits and typical outcomes of procedure.   Denise Austin S 05/22/2017 11:47 AM

## 2017-05-22 NOTE — Op Note (Signed)
05/22/2017  1:19 PM  PATIENT:  Denise Austin  46 y.o. female  PRE-OPERATIVE DIAGNOSIS:  Small wound dehiscence  POST-OPERATIVE DIAGNOSIS:  same  PROCEDURE:  Irrigation and debridement of wound down to the fascia, followed by primary closure  SURGEON:  Sherley Bounds, MD  ASSISTANTS: None  ANESTHESIA:   General  EBL: Minimal ml  Total I/O In: 500 [I.V.:500] Out: 0   BLOOD ADMINISTERED: none  DRAINS: None  SPECIMEN:  none  INDICATION FOR PROCEDURE: This patient presented with a small superior wound dehiscence.   Recommended wound revision. Patient understood the risks, benefits, and alternatives and potential outcomes and wished to proceed.  PROCEDURE DETAILS: The patient was taken to the operating room and after induction of adequate generalized endotracheal anesthesia she was rolled to the prone position on the Wilson frame and all pressure points were padded. The lumbar region was cleaned and then prepped with DuraPrep and draped in the usual sterile fashion. The wound dehiscence measuring about 1 cm. However it did go down to the fascia. I debrided the edges of the wound all the way down to the fascia. I irrigated with saline solution containing bacitracin. I placed vancomycin powder into the wound. I closed the deep tissues with 3-0 Vicryl. I closed the subcuticular tissue with 3-0 Vicryl. I closed the skin with Dermabond, benzoin, and Steri-Strips. A sterile dressing was applied. The patient was then awakened from general anesthesia and transferred to the recovery room in stable condition. At the end of the procedure all sponge needle and instrument counts were correct.   PLAN OF CARE: Discharge to home after PACU  PATIENT DISPOSITION:  PACU - hemodynamically stable.   Delay start of Pharmacological VTE agent (>24hrs) due to surgical blood loss or risk of bleeding:  yes

## 2017-05-22 NOTE — Transfer of Care (Signed)
Immediate Anesthesia Transfer of Care Note  Patient: Denise Austin  Procedure(s) Performed: Procedure(s) with comments: Revision Of Lumbar Wound (N/A) - Lumbar wound revision  Patient Location: PACU  Anesthesia Type:General  Level of Consciousness: awake, oriented and patient cooperative  Airway & Oxygen Therapy: Patient Spontanous Breathing and Patient connected to nasal cannula oxygen  Post-op Assessment: Report given to RN, Post -op Vital signs reviewed and stable and Patient moving all extremities  Post vital signs: Reviewed and stable  Last Vitals:  Vitals:   05/22/17 1155  BP: 124/81  Pulse: 82  Resp: 18  Temp: 36.9 C    Last Pain:  Vitals:   05/22/17 1155  TempSrc: Oral         Complications: No apparent anesthesia complications

## 2017-05-22 NOTE — Anesthesia Preprocedure Evaluation (Addendum)
Anesthesia Evaluation  Patient identified by MRN, date of birth, ID band Patient awake    Reviewed: Allergy & Precautions, NPO status , Patient's Chart, lab work & pertinent test results  History of Anesthesia Complications (+) PONV  Airway Mallampati: II  TM Distance: >3 FB Neck ROM: Full    Dental  (+) Missing, Dental Advisory Given   Pulmonary neg pulmonary ROS,    breath sounds clear to auscultation       Cardiovascular hypertension, Pt. on medications and Pt. on home beta blockers (-) angina Rhythm:Regular Rate:Normal     Neuro/Psych Persistent CSF leak    GI/Hepatic Neg liver ROS, GERD  Medicated and Controlled,  Endo/Other  Morbid obesity  Renal/GU negative Renal ROS     Musculoskeletal   Abdominal (+) + obese,   Peds  Hematology negative hematology ROS (+)   Anesthesia Other Findings   Reproductive/Obstetrics                            Anesthesia Physical Anesthesia Plan  ASA: II  Anesthesia Plan: General   Post-op Pain Management:    Induction: Intravenous  PONV Risk Score and Plan: 4 or greater and Ondansetron, Dexamethasone, Midazolam and Scopolamine patch - Pre-op  Airway Management Planned: Oral ETT  Additional Equipment:   Intra-op Plan:   Post-operative Plan: Extubation in OR  Informed Consent: I have reviewed the patients History and Physical, chart, labs and discussed the procedure including the risks, benefits and alternatives for the proposed anesthesia with the patient or authorized representative who has indicated his/her understanding and acceptance.   Dental advisory given  Plan Discussed with: CRNA and Surgeon  Anesthesia Plan Comments: (Plan routine monitors, GETA )        Anesthesia Quick Evaluation

## 2017-05-23 ENCOUNTER — Encounter (HOSPITAL_COMMUNITY): Payer: Self-pay | Admitting: Neurological Surgery

## 2017-05-29 MED FILL — OXYCODONE W/APAP 5/325 TAB: 5-325 | 8 days supply | Qty: 50 | Fill #0

## 2017-05-29 MED FILL — METHOCARBAMOL 500 MG TABLET: 500 | 20 days supply | Qty: 60 | Fill #0

## 2017-06-01 ENCOUNTER — Encounter: Payer: Self-pay | Admitting: *Deleted

## 2017-06-01 ENCOUNTER — Other Ambulatory Visit: Payer: Self-pay | Admitting: *Deleted

## 2017-06-01 NOTE — Patient Outreach (Signed)
[facility]) Care Management  06/01/2017  Denise Austin 1970-12-16 [id number]   Subjective: Telephone call to patient's home / mobile number, spoke with patient, and HIPAA verified. Discussed [facility] Care Management UMR Transition of care follow up, and is in agreement to complete follow up.  Patient states she is doing well, remembers speaking with this RNCM in the past, surgery went well, and has a follow up appointment with surgeon on 06/02/17.  Patient voices understanding of medical diagnosis and treatment plan.  States she is accessing the following [facility] benefits: outpatient pharmacy, hospital indemnity, andfamily medical leave act (FMLA) already in place.  Patient states she does not have any care coordination, disease management, disease monitoring, transportation, community resource, or pharmacy needs at this time. States she is very appreciative of the follow up and is in agreement to receive [place] Management information.    Objective: Per chart review, patient hospitalized  05/22/17 - 05/22/17 for wound revision.   Patient hospitalized  04/23/27 - 04/23/17 for Right L2-3 extraforaminal disc protrusion with back and right thigh pain.   Status post right L2-3 extraforaminal microdiscectomy utilizing microscopic dissection on 04/22/17.  [facility] Care Management Preoperative call follow up completed on 05/21/17.    Assessment: Received UMR Preoperative Call Follow up referral on 05/21/17.  Preoperative call completed, Transition of care follow up completed, no care management needs, and will proceed with case closure.    Plan: RNCM will send patient successful outreach letter, [facility] pamphlet, and magnet. RNCM will send case closure due to follow up completed / no care management needs request to [facility] [place] [facility].     Issiah Huffaker H. Annia Friendly, BSN, [place] Management [facility] Telephonic CM Phone: [phone] Fax: [phone]

## 2017-06-02 DIAGNOSIS — M5126 Other intervertebral disc displacement, lumbar region: Secondary | ICD-10-CM | POA: Diagnosis not present

## 2017-06-16 MED FILL — GABAPENTIN 300 MG CAPSULE: 300 | 30 days supply | Qty: 90 | Fill #1

## 2017-06-30 MED FILL — METHOCARBAMOL 500 MG TABLET: 500 | 15 days supply | Qty: 60 | Fill #1

## 2017-06-30 MED FILL — VALACYCLOVIR HCL 500 MG TAB: 500 | 90 days supply | Qty: 90 | Fill #1

## 2017-06-30 MED FILL — AMLODIPINE BESYLATE 10 MG T: 10 | 90 days supply | Qty: 90 | Fill #1

## 2017-06-30 MED FILL — METOPROLOL SUCC ER 25 MG TA: 25 | 90 days supply | Qty: 90 | Fill #1

## 2017-06-30 MED FILL — OMEPRAZOLE DR 40 MG CAPSULE: 40 | 90 days supply | Qty: 90 | Fill #0

## 2017-07-01 MED FILL — OXYCODONE W/APAP 5/325 TAB: 5-325 | 9 days supply | Qty: 50 | Fill #0

## 2017-07-06 ENCOUNTER — Ambulatory Visit: Payer: 59 | Admitting: Family

## 2017-07-06 DIAGNOSIS — Z0289 Encounter for other administrative examinations: Secondary | ICD-10-CM

## 2017-07-06 DIAGNOSIS — M5126 Other intervertebral disc displacement, lumbar region: Secondary | ICD-10-CM | POA: Diagnosis not present

## 2017-07-08 ENCOUNTER — Ambulatory Visit (INDEPENDENT_AMBULATORY_CARE_PROVIDER_SITE_OTHER): Payer: 59 | Admitting: Family

## 2017-07-08 ENCOUNTER — Encounter: Payer: Self-pay | Admitting: Family

## 2017-07-08 DIAGNOSIS — B009 Herpesviral infection, unspecified: Secondary | ICD-10-CM

## 2017-07-08 DIAGNOSIS — Z23 Encounter for immunization: Secondary | ICD-10-CM | POA: Diagnosis not present

## 2017-07-08 DIAGNOSIS — I10 Essential (primary) hypertension: Secondary | ICD-10-CM

## 2017-07-08 MED ORDER — METOPROLOL SUCCINATE ER 25 MG PO TB24: 25.0000 mg | ORAL_TABLET | Freq: Every day | ORAL | 1 refills | Status: DC

## 2017-07-08 MED ORDER — VALACYCLOVIR HCL 500 MG PO TABS: 500.0000 mg | ORAL_TABLET | Freq: Every day | ORAL | 1 refills | Status: DC

## 2017-07-08 MED ORDER — AMLODIPINE BESYLATE 10 MG PO TABS: 10.0000 mg | ORAL_TABLET | Freq: Every day | ORAL | 1 refills | Status: DC

## 2017-07-08 MED ORDER — OMEPRAZOLE 40 MG PO CPDR: 40.0000 mg | DELAYED_RELEASE_CAPSULE | Freq: Every day | ORAL | 1 refills | Status: DC | PRN

## 2017-07-08 NOTE — Progress Notes (Signed)
Subjective:    Patient ID: Denise Austin, female    DOB: Sep 09, 1971, 46 y.o.   MRN: [id number]  HPI  Denise Austin is a 46 yr old female who presents today for follow up of her hypertension. She is maintained on metoprolol 25mg  as well as amlodipine 10mg . Denies CP/SOB/Sweling.   BP Readings from Last 3 Encounters:  07/08/17 122/88  05/22/17 120/81  05/20/17 115/82    HSV- No recent breakouts Review of Systems See HPI  Past Medical History:  Diagnosis Date  . GERD (gastroesophageal reflux disease)   . Hypertension   . PONV (postoperative nausea and vomiting)   . Uterine fibroid      Social History   Social History  . Marital status: Single    Spouse name: N/A  . Number of children: N/A  . Years of education: N/A   Occupational History  . Not on file.   Social History Main Topics  . Smoking status: Never Smoker  . Smokeless tobacco: Never Used  . Alcohol use 0.0 oz/week     Comment: occasional 1-2 times a month  . Drug use: No  . Sexual activity: No   Other Topics Concern  . 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 BUNIONECTOMY    . CHOLECYSTECTOMY    . LUMBAR LAMINECTOMY/DECOMPRESSION MICRODISCECTOMY Right 04/22/2017   Procedure: Extraforaminal Microdiscectomy - Lumbar two-Lumbar three - right;  Surgeon: Eustace Moore, MD;  Location: [place];  Service: Neurosurgery;  Laterality: Right;  . WOUND EXPLORATION N/A 05/22/2017   Procedure: Revision Of Lumbar Wound;  Surgeon: Eustace Moore, MD;  Location: [place];  Service: Neurosurgery;  Laterality: N/A;  Lumbar wound revision    Family History  Problem Relation Age of Onset  . Hypertension  Mother   . Diabetes Mother   . Hypertension Father        smoker  . COPD Father   . Single kidney Sister        removed as a child  . Heart attack Neg Hx   . Hyperlipidemia Neg Hx   . Sudden death Neg Hx     Allergies  Allergen Reactions  . Oxycodone-Acetaminophen Nausea And Vomiting and Rash    PT STATES SHE CAN TOLERATE    Current Outpatient Prescriptions on File Prior to Visit  Medication Sig Dispense Refill  . amLODipine (NORVASC) 10 MG tablet Take 1 tablet (10 mg total) by mouth daily. 90 tablet 1  . BIOTIN PO Take 1 tablet by mouth daily. Gummy vitamin    . gabapentin (NEURONTIN) 300 MG capsule Take 300 mg by mouth 2 (two) times daily.  1  . methocarbamol (ROBAXIN) 500 MG tablet Take 1 tablet (500 mg total) by mouth every 6 (six) hours as needed for muscle spasms. 60 tablet 1  . metoprolol succinate (TOPROL-XL) 25 MG 24 hr tablet Take 1 tablet (25 mg total) by mouth daily. 90 tablet 1  . omeprazole (PRILOSEC) 40 MG capsule Take 1 capsule (40 mg total) by mouth daily. 90 capsule 1  . oxyCODONE-acetaminophen (PERCOCET/ROXICET) 5-325 MG tablet  Take 1 tablet by mouth every 4 (four) hours as needed. pain  0  . valACYclovir (VALTREX) 500 MG tablet Take 1 tablet (500 mg total) by mouth daily. (Patient taking differently: Take 500 mg by mouth at bedtime. ) 90 tablet 1   No current facility-administered medications on file prior to visit.     BP 122/88 (BP Location: Right Arm, Cuff Size: Large)   Pulse 65   Temp 98.3 F (36.8 C) (Oral)   Resp 18   Ht 5' 4.5" (1.638 m)   Wt 221 lb 9.6 oz (100.5 kg)   LMP 04/05/2017   SpO2 100%   BMI 37.45 kg/m       Objective:   Physical Exam  Constitutional: She is oriented to person, place, and time. She appears well-developed and well-nourished.  Cardiovascular: Normal rate, regular rhythm and normal heart sounds.   No murmur heard. Pulmonary/Chest: Effort normal and breath sounds normal. No respiratory distress. She has no wheezes.    Musculoskeletal: She exhibits no edema.  Neurological: She is alert and oriented to person, place, and time.  Psychiatric: She has a normal mood and affect. Her behavior is normal. Judgment and thought content normal.          Assessment & Plan:

## 2017-07-10 DIAGNOSIS — B009 Herpesviral infection, unspecified: Secondary | ICD-10-CM | POA: Insufficient documentation

## 2017-07-10 NOTE — Assessment & Plan Note (Signed)
Stable without breakouts. Continue valtrex.

## 2017-07-10 NOTE — Assessment & Plan Note (Signed)
Blood pressures improved on current regimen. Continue same.

## 2017-07-22 ENCOUNTER — Ambulatory Visit (INDEPENDENT_AMBULATORY_CARE_PROVIDER_SITE_OTHER): Payer: 59 | Admitting: Surgery

## 2017-07-22 DIAGNOSIS — M5126 Other intervertebral disc displacement, lumbar region: Secondary | ICD-10-CM | POA: Diagnosis not present

## 2017-07-22 MED FILL — METHYLPREDNISOLONE 4 MG TAB: 4 | 6 days supply | Qty: 21 | Fill #0

## 2017-07-23 LAB — SEDIMENTATION RATE: Sed Rate: 17 mm/h (ref 0–20)

## 2017-07-23 LAB — C-REACTIVE PROTEIN: CRP: 3.3 mg/L (ref <8.0)

## 2017-07-27 MED FILL — OXYCODONE W/APAP 5/325 TAB: 5-325 | 8 days supply | Qty: 50 | Fill #0

## 2017-07-27 MED FILL — METHOCARBAMOL 500 MG TABLET: 500 | 20 days supply | Qty: 60 | Fill #0

## 2017-07-27 MED FILL — GABAPENTIN 300 MG CAPSULE: 300 | 30 days supply | Qty: 90 | Fill #0

## 2017-07-30 ENCOUNTER — Ambulatory Visit
Admission: RE | Admit: 2017-07-30 | Discharge: 2017-07-30 | Disposition: A | Discharge status: Home / Self Care | Payer: 59 | Source: Ambulatory Visit | Attending: Neurological Surgery | Admitting: Neurological Surgery

## 2017-07-30 ENCOUNTER — Other Ambulatory Visit: Payer: Self-pay | Admitting: Neurological Surgery

## 2017-07-30 DIAGNOSIS — M5126 Other intervertebral disc displacement, lumbar region: Secondary | ICD-10-CM | POA: Diagnosis not present

## 2017-07-30 MED ORDER — GADOBENATE DIMEGLUMINE 529 MG/ML IV SOLN
20.0000 mL | Freq: Once | INTRAVENOUS | Status: AC | PRN
  Administered 2017-07-30: 20 mL via INTRAVENOUS

## 2017-07-31 ENCOUNTER — Other Ambulatory Visit: Payer: Self-pay | Admitting: Neurological Surgery

## 2017-08-03 DIAGNOSIS — M5416 Radiculopathy, lumbar region: Secondary | ICD-10-CM | POA: Diagnosis not present

## 2017-08-03 MED FILL — METHYLPREDNISOLONE 4 MG TAB: 4 | 6 days supply | Qty: 21 | Fill #0

## 2017-08-06 ENCOUNTER — Encounter (INDEPENDENT_AMBULATORY_CARE_PROVIDER_SITE_OTHER): Payer: Self-pay | Admitting: Physical Medicine and Rehabilitation

## 2017-08-06 ENCOUNTER — Ambulatory Visit (INDEPENDENT_AMBULATORY_CARE_PROVIDER_SITE_OTHER): Payer: 59

## 2017-08-06 ENCOUNTER — Ambulatory Visit (INDEPENDENT_AMBULATORY_CARE_PROVIDER_SITE_OTHER): Payer: 59 | Admitting: Physical Medicine and Rehabilitation

## 2017-08-06 VITALS — BP 121/80 | HR 61 | Temp 97.9°F

## 2017-08-06 DIAGNOSIS — M961 Postlaminectomy syndrome, not elsewhere classified: Secondary | ICD-10-CM | POA: Diagnosis not present

## 2017-08-06 DIAGNOSIS — M5416 Radiculopathy, lumbar region: Secondary | ICD-10-CM

## 2017-08-06 MED ORDER — LIDOCAINE HCL (PF) 1 % IJ SOLN
2.0000 mL | Freq: Once | INTRAMUSCULAR | Status: AC
  Administered 2017-08-06: 2 mL

## 2017-08-06 MED ORDER — BETAMETHASONE SOD PHOS & ACET 6 (3-3) MG/ML IJ SUSP
12.0000 mg | Freq: Once | INTRAMUSCULAR | Status: AC
  Administered 2017-08-06: 12 mg

## 2017-08-06 NOTE — Progress Notes (Deleted)
Pt having some pain today, pain scale 4, no blood thinners, numbness right leg, Dr. Ronnald Ramp thinks pain is coming form scar tissue and nerve on right side, taking medrol dose pak and gabapentin.

## 2017-08-06 NOTE — Patient Instructions (Signed)

## 2017-08-12 ENCOUNTER — Other Ambulatory Visit: Payer: 59

## 2017-08-13 NOTE — Procedures (Signed)
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 We have completed prior epidural injection was short-term relief. Since I've seen her she has, on the have microdiscectomy performed. She is still having some numbness and pain in the right leg. Dr. Ronnald Ramp her neurosurgeon requested right L2 epidural injection transforaminal A from a diagnostic and hopefully therapeutic standpoint. She has had an MRI since I've seen her in postsurgery. This is reviewed below   Recent lumbar MRI IMPRESSION: 1. Interval RIGHT L2-3 foraminotomy/facetectomy for discectomy with enhancing granulation tissue along the surgical approach resulting in moderate RIGHT L4-5 neural foraminal narrowing and, mild exited RIGHT L2 nerve impingement.  Lumbosacral Transforaminal Epidural Steroid Injection - Sub-Pedicular Approach with Fluoroscopic Guidance  Patient: Denise Austin      Date of Birth: 05-Mar-1971 MRN: [id number] PCP: Debbrah Alar, NP      Visit Date: 08/06/2017   Universal Protocol:    Date/Time: 08/06/2017  Consent Given By: the patient  Position: PRONE  Additional Comments: Vital signs were monitored before and after the procedure. Patient was prepped and draped in the usual sterile fashion. The correct patient, procedure, and site was verified.   Injection Procedure Details:  Procedure Site One Meds Administered:  Meds ordered this encounter  Medications  . lidocaine (PF) (XYLOCAINE) 1 % injection 2 mL  . betamethasone acetate-betamethasone sodium phosphate (CELESTONE) injection 12 mg    Laterality: Right  Location/Site:  L2-L3  Needle size: 22 G  Needle type: Spinal  Needle Placement: Transforaminal  Findings:  -Contrast Used: 1 mL iohexol 180 mg iodine/mL   -Comments: Excellent flow of contrast along the nerve and into the epidural space.  Procedure Details: After squaring off the end-plates to get a true AP view, the C-arm was positioned so that an oblique view of  the foramen as noted above was visualized. The target area is just inferior to the "nose of the scotty dog" or sub pedicular. The soft tissues overlying this structure were infiltrated with 2-3 ml. of 1% Lidocaine without Epinephrine.  The spinal needle was inserted toward the target using a "trajectory" view along the fluoroscope beam.  Under AP and lateral visualization, the needle was advanced so it did not puncture dura and was located close the 6 O'Clock position of the pedical in AP tracterory. Biplanar projections were used to confirm position. Aspiration was confirmed to be negative for CSF and/or blood. A 1-2 ml. volume of Isovue-250 was injected and flow of contrast was noted at each level. Radiographs were obtained for documentation purposes.   After attaining the desired flow of contrast documented above, a 0.5 to 1.0 ml test dose of 0.25% Marcaine was injected into each respective transforaminal space.  The patient was observed for 90 seconds post injection.  After no sensory deficits were reported, and normal lower extremity motor function was noted,   the above injectate was administered so that equal amounts of the injectate were placed at each foramen (level) into the transforaminal epidural space.   Additional Comments:  The patient tolerated the procedure well Dressing: Band-Aid    Post-procedure details: Patient was observed during the procedure. Post-procedure instructions were reviewed.  Patient left the clinic in stable condition.

## 2017-08-19 NOTE — Progress Notes (Signed)
Lab work only- Nurse Visit Only

## 2017-08-20 MED FILL — METHOCARBAMOL 500 MG TABS: 500 | 20 days supply | Qty: 60 | Fill #1

## 2017-08-28 MED FILL — OXYCODONE-ACETAMINOPHEN 5-3: 5-325 | 9 days supply | Qty: 50 | Fill #0

## 2017-08-31 MED FILL — GABAPENTIN 300 MG CAPSULE: 300 | 30 days supply | Qty: 90 | Fill #1

## 2017-09-08 ENCOUNTER — Ambulatory Visit (INDEPENDENT_AMBULATORY_CARE_PROVIDER_SITE_OTHER): Payer: 59 | Admitting: Orthopaedic Surgery

## 2017-09-08 DIAGNOSIS — M7061 Trochanteric bursitis, right hip: Secondary | ICD-10-CM

## 2017-09-08 DIAGNOSIS — Z6838 Body mass index (BMI) 38.0-38.9, adult: Secondary | ICD-10-CM | POA: Diagnosis not present

## 2017-09-08 DIAGNOSIS — M5136 Other intervertebral disc degeneration, lumbar region: Secondary | ICD-10-CM

## 2017-09-08 DIAGNOSIS — I10 Essential (primary) hypertension: Secondary | ICD-10-CM | POA: Diagnosis not present

## 2017-09-08 DIAGNOSIS — M5416 Radiculopathy, lumbar region: Secondary | ICD-10-CM | POA: Diagnosis not present

## 2017-09-08 DIAGNOSIS — M461 Sacroiliitis, not elsewhere classified: Secondary | ICD-10-CM

## 2017-09-08 NOTE — Progress Notes (Signed)
Office Visit Note   Patient: Denise Austin           Date of Birth: 21-Oct-1971           MRN: [id number] Visit Date: 09/08/2017              Requested by: Debbrah Alar, NP [address] [place], [place] [address] PCP: Debbrah Alar, NP   Assessment & Plan: Visit Diagnoses:  1. Degenerative disc disease, lumbar   2. SI (sacroiliac) joint inflammation (HCC)   3. Trochanteric bursitis, right hip     Plan: To try to help sort out patient's right hip and upper leg pain I recommend trying diagnostic/therapeutic right trochanteric bursa injection. We'll see if we can get Dr. Ernestina Patches to do this tomorrow morning. Will also have patient start IT band stretching exercises.  She is also having soreness over her right SI joint with positive FABER test. We'll also consider trying diagnostic/therapeutic injection there as well the pain on her response with tomorrow's injection.  I will let neurosurgeon Dr. Sherley Bounds know how she did after the injection.     Follow-Up Instructions: Return if symptoms worsen or fail to improve.   Orders:  No orders of the defined types were placed in this encounter.  No orders of the defined types were placed in this encounter.     Procedures: No procedures performed   Clinical Data: No additional findings.   Subjective: No chief complaint on file.   HPI Patient seen by me today for recheck of low back pain, right lateral hip and thigh pain. Status post right L2-3 extraforaminal microdiscectomy 04/22/2017 and status post irrigation and debridement of wound down to the fascia followed by primary closure 05/22/2017 with both procedures being done by Dr. Sherley Bounds neurosurgeon. . Patient had lumbar ESI for ongoing back and leg pain and this did give some improvement temporarily. She was seen by Dr. Ronnald Ramp early this afternoon. She is complaining of pain in the right low back around the SI joint and also right lateral hip  pain that goes down her IT band to her knee. States that the lateral hip pain to her knee is the more problematic issue. Pain was she is ambulating and also laying on her right side. No complaints of groin pain. No complaints of lower extremity numbness and tingling.  No symptoms on the left side.  Patient states that she was told that ultimately may come down to her needing a lumbar fusion.   Review of Systems No current cardiac pulmonary GI GU issues.  Objective: Vital Signs: There were no vitals taken for this visit.  Physical Exam  Constitutional: She is oriented to person, place, and time. She appears well-developed.  HENT:  Head: Normocephalic and atraumatic.  Eyes: EOM are normal. Pupils are equal, round, and reactive to light.  Pulmonary/Chest: No respiratory distress.  Musculoskeletal:  Patient has a Trendelenburg gait. Negative logroll bilateral hips. Moderate to markedly tender over the right SI joint. Left side nontender. Positive right sided FABER test.  Negative on the left. Negative straight leg raise. She is markedly tender over the right hip greater trochanter bursa and there is also tenderness down the course of the IT band. Left side unremarkable. Bilateral knees good range of motion. Right knee tender medial plica. Bilateral calves are nontender. Right lateral hip soreness with resisted hip abduction. No focal motor deficits. Neurovascularly intact.  Neurological: She is alert and oriented to person,  place, and time.  Skin: Skin is warm and dry.  Psychiatric: She has a normal mood and affect.    Ortho Exam  Specialty Comments:  No specialty comments available.  Imaging: No results found.   PMFS History: Patient Active Problem List   Diagnosis Date Noted  . Herpes simplex viral infection 07/10/2017  . S/P lumbar laminectomy 04/22/2017  . GERD (gastroesophageal reflux disease) 07/25/2016  . Hyperglycemia 07/25/2016  . HTN (hypertension) 12/24/2015  .  Preventative health care 10/16/2014   Past Medical History:  Diagnosis Date  . GERD (gastroesophageal reflux disease)   . Hypertension   . PONV (postoperative nausea and vomiting)   . Uterine fibroid     Family History  Problem Relation Age of Onset  . Hypertension Mother   . Diabetes Mother   . Hypertension Father        smoker  . COPD Father   . Single kidney Sister        removed as a child  . Heart attack Neg Hx   . Hyperlipidemia Neg Hx   . Sudden death Neg Hx     Past Surgical History:  Procedure Laterality Date  . BUNIONECTOMY    . CHOLECYSTECTOMY     Social History   Occupational History  . Not on file  Tobacco Use  . Smoking status: Never Smoker  . Smokeless tobacco: Never Used  Substance and Sexual Activity  . Alcohol use: Yes    Alcohol/week: 0.0 oz    Comment: occasional 1-2 times a month  . Drug use: No  . Sexual activity: No    Partners: Male    Birth control/protection: None

## 2017-09-09 ENCOUNTER — Ambulatory Visit (INDEPENDENT_AMBULATORY_CARE_PROVIDER_SITE_OTHER): Payer: 59

## 2017-09-09 ENCOUNTER — Encounter (INDEPENDENT_AMBULATORY_CARE_PROVIDER_SITE_OTHER): Payer: Self-pay | Admitting: Physical Medicine and Rehabilitation

## 2017-09-09 ENCOUNTER — Ambulatory Visit (INDEPENDENT_AMBULATORY_CARE_PROVIDER_SITE_OTHER): Payer: 59 | Admitting: Physical Medicine and Rehabilitation

## 2017-09-09 ENCOUNTER — Encounter (INDEPENDENT_AMBULATORY_CARE_PROVIDER_SITE_OTHER): Payer: Self-pay | Admitting: Surgery

## 2017-09-09 DIAGNOSIS — M7061 Trochanteric bursitis, right hip: Secondary | ICD-10-CM | POA: Diagnosis not present

## 2017-09-09 NOTE — Progress Notes (Deleted)
Right hip and leg pain.

## 2017-09-09 NOTE — Progress Notes (Signed)
Denise Austin - 46 y.o. female MRN [id number]  Date of birth: 12/04/1970  Office Visit Note: Visit Date: 09/09/2017 PCP: Denise Alar, NP Referred by: Denise Alar, NP  Subjective: Chief Complaint  Patient presents with  . Right Hip - Pain   HPI: Denise Austin is a 46 year old female thigh pain.  She has undergone foraminotomies and facetectomy at L2-3 on the right and epidural injection.  Denise Austin has been following her and suggested a diagnostic and hopefully therapeutic right greater trochanter bursa injection with fluoroscopic guidance.    ROS Otherwise per HPI.  Assessment & Plan: Visit Diagnoses:  1. Greater trochanteric bursitis, right     Plan: Findings:  Right greater trochanteric bursa injection with fluoroscopic guidance.  Fluoroscopic guidance utilized for body habitus.  Patient did seem to have some mild relief during the anesthetic phase.    Meds & Orders: No orders of the defined types were placed in this encounter.  No orders of the defined types were placed in this encounter.   Follow-up: No Follow-up on file.   Procedures: Large Joint Inj: R greater trochanter on 09/09/2017 10:32 AM Indications: pain and diagnostic evaluation Details: 22 G 3.5 in needle, fluoroscopy-guided lateral approach  Arthrogram: No  Medications: 4 mL lidocaine 2 %; 80 mg triamcinolone acetonide 40 MG/ML; 4 mL bupivacaine 0.25 % Outcome: tolerated well, no immediate complications  There was excellent flow of contrast outlined the greater trochanteric bursa without vascular uptake. Procedure, treatment alternatives, risks and benefits explained, specific risks discussed. Consent was given by the patient. Immediately prior to procedure a time out was called to verify the correct patient, procedure, equipment, support staff and site/side marked as required. Patient was prepped and draped in the usual sterile fashion.      No notes on file   Clinical History: MRI  LUMBAR SPINE WITHOUT AND WITH CONTRAST  TECHNIQUE: Multiplanar and multiecho pulse sequences of the lumbar spine were obtained without and with intravenous contrast.  CONTRAST:  82mL MULTIHANCE GADOBENATE DIMEGLUMINE 529 MG/ML IV SOLN  COMPARISON:  Lumbar spine radiographs July 06, 2017 and MRI of lumbar spine December 09, 2016  FINDINGS: SEGMENTATION: For the purposes of this report, the last well-formed intervertebral disc will be reported as L5-S1.  ALIGNMENT: Maintained lumbar lordosis. No malalignment.  VERTEBRAE:Vertebral bodies are intact. New moderate L2-3 disc height loss with mild desiccation. Remaining lumbar discs demonstrate normal morphology and signal. No suspicious bone marrow signal. No abnormal osseous or disc enhancement.  CONUS MEDULLARIS: Conus medullaris terminates at T12-L1 and demonstrates normal morphology and signal characteristics. Cauda equina is normal. No abnormal cord, leptomeningeal or epidural enhancement.  PARASPINAL AND SOFT TISSUES: RIGHT paraspinal muscles/multifidus postoperative denervation. Mild enhancement along the surgical approach consistent with granulation tissue, no focal fluid collection.  DISC LEVELS:  T12-L1, L1-2: No disc bulge, canal stenosis nor neural foraminal narrowing.  L2-3: Status post RIGHT foraminotomy/facetectomy. Enhancing extraforaminal granulation tissue without recurrent or residual extrusion. No canal stenosis. Soft tissue about the facet and foramen results in moderate RIGHT neural foraminal narrowing and mild mass effect on the exited RIGHT L2 nerve.  L3-4: No disc bulge, canal stenosis nor neural foraminal narrowing. Mild facet arthropathy.  L4-5: Annular bulging, enhancing annular fissure to the RIGHT. Moderate facet arthropathy and ligamentum flavum redundancy without canal stenosis or neural foraminal narrowing. 4 mm LEFT facet synovial cyst within paraspinal soft  tissues.  L5-S1: Small broad-based disc bulge enhancing annular fissure. Moderate facet arthropathy. No canal stenosis or neural foraminal narrowing.  IMPRESSION: 1. Interval RIGHT L2-3 foraminotomy/facetectomy for discectomy with enhancing granulation tissue along the surgical approach resulting in moderate RIGHT L4-5 neural foraminal narrowing and, mild exited RIGHT L2 nerve impingement. 2. Similar degenerative change of lumbar spine without canal stenosis. Multilevel annular fissures.   Electronically Signed   By: Denise Austin M.D.   On: 07/31/2017 02:49  She reports that  has never smoked. she has never used smokeless tobacco.  Recent Labs    01/16/17 0750  HGBA1C 5.7    Objective:  VS:  HT:    WT:   BMI:     BP:   HR: bpm  TEMP: ( )  RESP:  Physical Exam  Musculoskeletal:  Patient ambulates without aid.  She does have some tenderness over the right greater trochanter and anterior thigh.    Ortho Exam Imaging: No results found.  Past Medical/Family/Surgical/Social History: Medications & Allergies reviewed per EMR Patient Active Problem List   Diagnosis Date Noted  . Herpes simplex viral infection 07/10/2017  . S/P lumbar laminectomy 04/22/2017  . GERD (gastroesophageal reflux disease) 07/25/2016  . Hyperglycemia 07/25/2016  . HTN (hypertension) 12/24/2015  . Preventative health care 10/16/2014   Past Medical History:  Diagnosis Date  . GERD (gastroesophageal reflux disease)   . Hypertension   . PONV (postoperative nausea and vomiting)   . Uterine fibroid    Family History  Problem Relation Age of Onset  . Hypertension Mother   . Diabetes Mother   . Hypertension Father        smoker  . COPD Father   . Single kidney Sister        removed as a child  . Heart attack Neg Hx   . Hyperlipidemia Neg Hx   . Sudden death Neg Hx    Past Surgical History:  Procedure Laterality Date  . BUNIONECTOMY    . CHOLECYSTECTOMY     Social History    Occupational History  . Not on file  Tobacco Use  . Smoking status: Never Smoker  . Smokeless tobacco: Never Used  Substance and Sexual Activity  . Alcohol use: Yes    Alcohol/week: 0.0 oz    Comment: occasional 1-2 times a month  . Drug use: No  . Sexual activity: No    Partners: Male    Birth control/protection: None

## 2017-09-09 NOTE — Progress Notes (Signed)
Patient received a diagnostic/therapeutic right greater trochanter bursa injection this morning by Dr. Ernestina Patches.  With Marcaine and place gait was much improved from yesterday. Patient reporting about 60% improvement of her right hip pain currently. She'll be given IT band stretching exercises to start. We'll check to see how she's doing in a few days.

## 2017-09-09 NOTE — Progress Notes (Signed)
LAB ORDERS WERE MADE-NURSE VISIT ONLY

## 2017-09-09 NOTE — Patient Instructions (Signed)

## 2017-09-15 MED ORDER — BUPIVACAINE HCL 0.25 % IJ SOLN
4.0000 mL | INTRAMUSCULAR | Status: AC | PRN
  Administered 2017-09-09: 4 mL via INTRA_ARTICULAR

## 2017-09-15 MED ORDER — LIDOCAINE HCL 2 % IJ SOLN
4.0000 mL | INTRAMUSCULAR | Status: AC | PRN
  Administered 2017-09-09: 4 mL

## 2017-09-15 MED ORDER — TRIAMCINOLONE ACETONIDE 40 MG/ML IJ SUSP
80.0000 mg | INTRAMUSCULAR | Status: AC | PRN
  Administered 2017-09-09: 80 mg via INTRA_ARTICULAR

## 2017-09-23 ENCOUNTER — Ambulatory Visit (INDEPENDENT_AMBULATORY_CARE_PROVIDER_SITE_OTHER): Payer: 59 | Admitting: Surgery

## 2017-09-23 DIAGNOSIS — M5136 Other intervertebral disc degeneration, lumbar region: Secondary | ICD-10-CM

## 2017-09-23 DIAGNOSIS — M7061 Trochanteric bursitis, right hip: Secondary | ICD-10-CM

## 2017-09-24 NOTE — Progress Notes (Signed)
Office Visit Note   Patient: Denise Austin           Date of Birth: Mar 12, 1971           MRN: [id number] Visit Date: 09/23/2017              Requested by: Debbrah Alar, NP [address] [place], [place] [address] PCP: Debbrah Alar, NP   Assessment & Plan: Visit Diagnoses:  1. Trochanteric bursitis, right hip     Plan: at this point a right hip MRI to rule out greater trochanteric bursitis and tendinopathy at the greater trochanter attachment and also any intra-articular hip pathology. Patient will follow up with me after completion of her study to discuss results and further treatment options. All questions answered.  Follow-Up Instructions: Return in about 2 weeks (around 10/07/2017) for Jeneen Rinks to review MRI.   Orders:  No orders of the defined types were placed in this encounter.  No orders of the defined types were placed in this encounter.     Procedures: No procedures performed   Clinical Data: No additional findings.   Subjective: No chief complaint on file.   HPI Patient returns for recheck of right lateral hip and thigh pain. She had a right greater trochanter Marcaine/Depo-Medrol injection with Dr. Ernestina Patches 09/09/2017. Patient reports having about 60-80% improvement of her pain temporarily. She reports return of the lateral hip pain along with swelling lateral hip and states that this area feels tight. Not really complaining of any back pain currently. No groin pain. Pain continues to be aggravated with ambulation and laying on her right side.   Review of Systems No current cardiac pulmonary GI GU issues  Objective: Vital Signs: There were no vitals taken for this visit.  Physical Exam  Constitutional: She is oriented to person, place, and time. She appears well-developed. No distress.  HENT:  Head: Normocephalic and atraumatic.  Eyes: EOM are normal. Pupils are equal, round, and reactive to light.  Neck: Normal range of  motion.  Abdominal: She exhibits no distension.  Musculoskeletal:  Patient is a slight Trendelenburg gait. Lumbar spine nontender. Negative logroll bilateral hips. She does have some visible swelling right lateral hip. She is markedly tender over the greater trochanter bursa on the right and also has tenderness down the course of her IT band. Left hip unremarkable. Negative straight leg raise. No focal motor deficits. Neurovascularly intact.  Neurological: She is alert and oriented to person, place, and time.  Skin: Skin is warm and dry.  Psychiatric: She has a normal mood and affect.    Ortho Exam  Specialty Comments:  No specialty comments available.  Imaging: No results found.   PMFS History: Patient Active Problem List   Diagnosis Date Noted  . Herpes simplex viral infection 07/10/2017  . S/P lumbar laminectomy 04/22/2017  . GERD (gastroesophageal reflux disease) 07/25/2016  . Hyperglycemia 07/25/2016  . HTN (hypertension) 12/24/2015  . Preventative health care 10/16/2014   Past Medical History:  Diagnosis Date  . GERD (gastroesophageal reflux disease)   . Hypertension   . PONV (postoperative nausea and vomiting)   . Uterine fibroid     Family History  Problem Relation Age of Onset  . Hypertension Mother   . Diabetes Mother   . Hypertension Father        smoker  . COPD Father   . Single kidney Sister        removed as a child  . Heart  attack Neg Hx   . Hyperlipidemia Neg Hx   . Sudden death Neg Hx     Past Surgical History:  Procedure Laterality Date  . BUNIONECTOMY    . CHOLECYSTECTOMY    . LUMBAR LAMINECTOMY/DECOMPRESSION MICRODISCECTOMY Right 04/22/2017   Procedure: Extraforaminal Microdiscectomy - Lumbar two-Lumbar three - right;  Surgeon: Eustace Moore, MD;  Location: [facility];  Service: Neurosurgery;  Laterality: Right;  . WOUND EXPLORATION N/A 05/22/2017   Procedure: Revision Of Lumbar Wound;  Surgeon: Eustace Moore, MD;  Location: [facility];  Service:  Neurosurgery;  Laterality: N/A;  Lumbar wound revision   Social History   Occupational History  . Not on file  Tobacco Use  . Smoking status: Never Smoker  . Smokeless tobacco: Never Used  Substance and Sexual Activity  . Alcohol use: Yes    Alcohol/week: 0.0 oz    Comment: occasional 1-2 times a month  . Drug use: No  . Sexual activity: No    Partners: Male    Birth control/protection: None

## 2017-09-25 ENCOUNTER — Other Ambulatory Visit (INDEPENDENT_AMBULATORY_CARE_PROVIDER_SITE_OTHER): Payer: Self-pay

## 2017-09-25 MED ORDER — DIAZEPAM 5 MG PO TABS: ORAL_TABLET | ORAL | 0 refills | Status: DC

## 2017-09-25 MED FILL — diazePAM 5 MG TABS: 5 | 1 days supply | Qty: 2 | Fill #0

## 2017-09-26 ENCOUNTER — Ambulatory Visit
Admission: RE | Admit: 2017-09-26 | Discharge: 2017-09-26 | Disposition: A | Discharge status: Home / Self Care | Payer: 59 | Source: Ambulatory Visit | Attending: Surgery | Admitting: Surgery

## 2017-09-26 DIAGNOSIS — M7061 Trochanteric bursitis, right hip: Secondary | ICD-10-CM | POA: Diagnosis not present

## 2017-09-30 ENCOUNTER — Telehealth (INDEPENDENT_AMBULATORY_CARE_PROVIDER_SITE_OTHER): Payer: Self-pay

## 2017-09-30 MED FILL — OXYCODONE-ACETAMINOPHEN 5-3: 5-325 | 9 days supply | Qty: 50 | Fill #0

## 2017-09-30 NOTE — Telephone Encounter (Signed)
Patient would like to go to therapy for her right leg pain Please advise. Thanks.

## 2017-09-30 NOTE — Telephone Encounter (Signed)
I saw Denise Austin today and talked with her in the hall.  I reviewed her MRI scan which does show some trochanteric bursitis affecting the right hip.  No other arthritis or muscle tears in that right hip region.  I think physical therapy for iliotibial band stretching and strengthening along with modalities as well as pen said as a topical anti-inflammatory would be a good option for [name] to try as conservative measures for this trochanteric bursitis.  She has already had one injection into that trochanteric bursal region which helped for a while.  I would follow that up with more conservative treatment.  Prescription is provided today with a copy of the MRI scan.

## 2017-10-03 ENCOUNTER — Other Ambulatory Visit: Payer: 59

## 2017-10-07 NOTE — Progress Notes (Unsigned)
ERROR

## 2017-10-12 ENCOUNTER — Other Ambulatory Visit: Payer: Self-pay | Admitting: Family

## 2017-10-12 MED FILL — GABAPENTIN 300 MG CAPSULE: 300 | 30 days supply | Qty: 90 | Fill #0

## 2017-10-12 MED FILL — OMEPRAZOLE DR 40 MG CAPSULE: 40 | 90 days supply | Qty: 90 | Fill #1

## 2017-10-12 MED FILL — METHOCARBAMOL 500 MG TABS: 500 | 20 days supply | Qty: 60 | Fill #0

## 2017-10-14 ENCOUNTER — Other Ambulatory Visit: Payer: Self-pay | Admitting: Family

## 2017-10-14 DIAGNOSIS — M6281 Muscle weakness (generalized): Secondary | ICD-10-CM | POA: Diagnosis not present

## 2017-10-14 DIAGNOSIS — M7601 Gluteal tendinitis, right hip: Secondary | ICD-10-CM | POA: Diagnosis not present

## 2017-10-14 DIAGNOSIS — M7061 Trochanteric bursitis, right hip: Secondary | ICD-10-CM | POA: Diagnosis not present

## 2017-10-14 DIAGNOSIS — M25551 Pain in right hip: Secondary | ICD-10-CM | POA: Diagnosis not present

## 2017-10-16 ENCOUNTER — Other Ambulatory Visit: Payer: Self-pay | Admitting: Family

## 2017-10-21 ENCOUNTER — Other Ambulatory Visit: Payer: Self-pay | Admitting: Family

## 2017-10-21 ENCOUNTER — Encounter: Payer: Self-pay | Admitting: Family

## 2017-10-21 MED ORDER — AMLODIPINE BESYLATE 10 MG PO TABS: 10.0000 mg | ORAL_TABLET | Freq: Every day | ORAL | 1 refills | Status: DC

## 2017-10-21 MED ORDER — METOPROLOL SUCCINATE ER 25 MG PO TB24: 25.0000 mg | ORAL_TABLET | Freq: Every day | ORAL | 1 refills | Status: DC

## 2017-10-21 MED FILL — METOPROLOL SUCC ER 25 MG TA: 25 | 90 days supply | Qty: 90 | Fill #0

## 2017-10-21 MED FILL — AMLODIPINE BESYLATE 10 MG T: 10 | 90 days supply | Qty: 90 | Fill #0

## 2017-10-26 DIAGNOSIS — H52223 Regular astigmatism, bilateral: Secondary | ICD-10-CM | POA: Diagnosis not present

## 2017-11-03 ENCOUNTER — Telehealth: Payer: 59 | Admitting: Family

## 2017-11-03 DIAGNOSIS — R05 Cough: Secondary | ICD-10-CM

## 2017-11-03 DIAGNOSIS — J40 Bronchitis, not specified as acute or chronic: Secondary | ICD-10-CM

## 2017-11-03 DIAGNOSIS — R059 Cough, unspecified: Secondary | ICD-10-CM

## 2017-11-03 MED ORDER — PREDNISONE 10 MG (21) PO TBPK: ORAL_TABLET | ORAL | 0 refills | Status: DC

## 2017-11-03 MED FILL — predniSONE 10 MG TABS: 10 | 6 days supply | Qty: 21 | Fill #0

## 2017-11-03 NOTE — Progress Notes (Signed)

## 2017-11-05 ENCOUNTER — Telehealth: Payer: Self-pay | Admitting: Family

## 2017-11-05 NOTE — Telephone Encounter (Signed)
Copied from [place] [phone]. Topic: Quick Communication - See Telephone Encounter >> Nov 05, 2017  2:14 PM Rosalin Hawking wrote: CRM for notification. See Telephone encounter for:  01/10/ 2019    LVM for pt to reschedule appt on work phone since provider not in office on 11-09-2017, pt need to reschedule, cell phone VM full.

## 2017-11-06 ENCOUNTER — Ambulatory Visit: Payer: 59 | Admitting: Family Medicine

## 2017-11-06 DIAGNOSIS — Z0289 Encounter for other administrative examinations: Secondary | ICD-10-CM

## 2017-11-09 ENCOUNTER — Ambulatory Visit: Payer: Self-pay | Admitting: Family

## 2017-11-09 ENCOUNTER — Ambulatory Visit: Payer: 59 | Admitting: Family Medicine

## 2017-11-13 MED FILL — OXYCODONE-ACETAMINOPHEN 5-3: 5-325 | 8 days supply | Qty: 50 | Fill #0

## 2017-11-23 ENCOUNTER — Telehealth (INDEPENDENT_AMBULATORY_CARE_PROVIDER_SITE_OTHER): Payer: Self-pay | Admitting: Radiology

## 2017-11-23 MED ORDER — IBUPROFEN 800 MG PO TABS: 800.0000 mg | ORAL_TABLET | Freq: Three times a day (TID) | ORAL | 0 refills | Status: DC | PRN

## 2017-11-23 MED FILL — IBUPROFEN 800 MG TABS: 800 | 30 days supply | Qty: 90 | Fill #0

## 2017-11-23 NOTE — Telephone Encounter (Signed)
Benjiman Core, PA-C spoke with patient. Call in Ibuprofen 800mg  1 po q 8 hours prn #90 with no refills. Patient uses [facility].   Script sent to pharmacy.

## 2017-11-24 ENCOUNTER — Other Ambulatory Visit (INDEPENDENT_AMBULATORY_CARE_PROVIDER_SITE_OTHER): Payer: Self-pay | Admitting: Orthopaedic Surgery

## 2017-11-24 MED ORDER — AZITHROMYCIN 250 MG PO TABS: ORAL_TABLET | ORAL | 0 refills | Status: DC

## 2017-11-24 MED FILL — AZITHROMYCIN 250 MG TABLET: 250 | 5 days supply | Qty: 6 | Fill #0

## 2017-12-01 ENCOUNTER — Ambulatory Visit: Payer: Self-pay | Admitting: Family

## 2017-12-04 ENCOUNTER — Encounter: Payer: Self-pay | Admitting: Family

## 2017-12-04 ENCOUNTER — Ambulatory Visit: Payer: Self-pay | Admitting: Family

## 2017-12-04 ENCOUNTER — Ambulatory Visit (INDEPENDENT_AMBULATORY_CARE_PROVIDER_SITE_OTHER): Payer: 59 | Admitting: Family

## 2017-12-04 VITALS — BP 126/86 | HR 71 | Temp 98.1°F | Resp 16 | Ht 65.0 in | Wt 230.2 lb

## 2017-12-04 DIAGNOSIS — B9789 Other viral agents as the cause of diseases classified elsewhere: Secondary | ICD-10-CM

## 2017-12-04 DIAGNOSIS — J069 Acute upper respiratory infection, unspecified: Secondary | ICD-10-CM | POA: Diagnosis not present

## 2017-12-04 MED ORDER — BENZONATATE 100 MG PO CAPS: 100.0000 mg | ORAL_CAPSULE | Freq: Three times a day (TID) | ORAL | 0 refills | Status: DC | PRN

## 2017-12-04 MED FILL — BENZONATATE 100 MG CAPSULE: 100 | 6 days supply | Qty: 20 | Fill #0

## 2017-12-04 NOTE — Patient Instructions (Signed)
Continue mucinex, you may use tylenol as needed for pain, nasal saline spray. For cough you may use tessalon.  Viral Respiratory Infection A viral respiratory infection is an illness that affects parts of the body used for breathing, like the lungs, nose, and throat. It is caused by a germ called a virus. Some examples of this kind of infection are:  A cold.  The flu (influenza).  A respiratory syncytial virus (RSV) infection.  How do I know if I have this infection? Most of the time this infection causes:  A stuffy or runny nose.  Yellow or green fluid in the nose.  A cough.  Sneezing.  Tiredness (fatigue).  Achy muscles.  A sore throat.  Sweating or chills.  A fever.  A headache.  How is this infection treated? If the flu is diagnosed early, it may be treated with an antiviral medicine. This medicine shortens the length of time a person has symptoms. Symptoms may be treated with over-the-counter and prescription medicines, such as:  Expectorants. These make it easier to cough up mucus.  Decongestant nasal sprays.  Doctors do not prescribe antibiotic medicines for viral infections. They do not work with this kind of infection. How do I know if I should stay home? To keep others from getting sick, stay home if you have:  A fever.  A lasting cough.  A sore throat.  A runny nose.  Sneezing.  Muscles aches.  Headaches.  Tiredness.  Weakness.  Chills.  Sweating.  An upset stomach (nausea).  Follow these instructions at home:  Rest as much as possible.  Take over-the-counter and prescription medicines only as told by your doctor.  Drink enough fluid to keep your pee (urine) clear or pale yellow.  Gargle with salt water. Do this 3-4 times per day or as needed. To make a salt-water mixture, dissolve -1 tsp of salt in 1 cup of warm water. Make sure the salt dissolves all the way.  Use nose drops made from salt water. This helps with stuffiness  (congestion). It also helps soften the skin around your nose.  Do not drink alcohol.  Do not use tobacco products, including cigarettes, chewing tobacco, and e-cigarettes. If you need help quitting, ask your doctor. Get help if:  Your symptoms last for 10 days or longer.  Your symptoms get worse over time.  You have a fever.  You have very bad pain in your face or forehead.  Parts of your jaw or neck become very swollen. Get help right away if:  You feel pain or pressure in your chest.  You have shortness of breath.  You faint or feel like you will faint.  You keep throwing up (vomiting).  You feel confused. This information is not intended to replace advice given to you by your health care provider. Make sure you discuss any questions you have with your health care provider. Document Released: 09/25/2008 Document Revised: 03/20/2016 Document Reviewed: 03/21/2015 Elsevier Interactive Patient Education  2018 [employer].

## 2017-12-04 NOTE — Progress Notes (Signed)
Subjective:    Patient ID: Rondall Allegra, female    DOB: 12-07-1970, 47 y.o.   MRN: [id number]  HPI   First developed sore throat, cough, congestion 10/28/17.  She reports that she did an e-visit on 11/03/17. She was given rx for prednisone and mucinex. Reports she took x 6 days. Started to feel a little better. About 2 days after she completed, symptoms returned. Saw provider at work. Was given a zpak. She start along with mucinex and dayquil/nyquil. Started feeling a little better.  2 days ago woke up feeling "stuffy, coughing, throat scratchy." Denies fever. Reports everyone at her job is sick.    Review of Systems    see HPI  Past Medical History:  Diagnosis Date  . GERD (gastroesophageal reflux disease)   . Hypertension   . PONV (postoperative nausea and vomiting)   . Uterine fibroid      Social History   Socioeconomic History  . Marital status: Single    Spouse name: Not on file  . Number of children: Not on file  . Years of education: Not on file  . Highest education level: Not on file  Social Needs  . Financial resource strain: Not on file  . Food insecurity - worry: Not on file  . Food insecurity - inability: Not on file  . Transportation needs - medical: Not on file  . Transportation needs - non-medical: Not on file  Occupational History  . Not on file  Tobacco Use  . Smoking status: Never Smoker  . Smokeless tobacco: Never Used  Substance and Sexual Activity  . Alcohol use: Yes    Alcohol/week: 0.0 oz    Comment: occasional 1-2 times a month  . Drug use: No  . Sexual activity: No    Partners: Male    Birth control/protection: None  Other Topics Concern  . 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 BUNIONECTOMY    . CHOLECYSTECTOMY    . LUMBAR LAMINECTOMY/DECOMPRESSION MICRODISCECTOMY Right 04/22/2017   Procedure: Extraforaminal Microdiscectomy - Lumbar two-Lumbar three - right;  Surgeon: Eustace Moore, MD;  Location: [place];  Service: Neurosurgery;  Laterality: Right;  . WOUND EXPLORATION N/A 05/22/2017   Procedure: Revision Of Lumbar Wound;  Surgeon: Eustace Moore, MD;  Location: [facility];  Service: Neurosurgery;  Laterality: N/A;  Lumbar wound revision    Family History  Problem Relation Age of Onset  . Hypertension Mother   . Diabetes Mother   . Hypertension Father        smoker  . COPD Father   . Single kidney Sister        removed as a child  . Heart attack Neg Hx   . Hyperlipidemia Neg Hx   . Sudden death Neg Hx     Allergies  Allergen Reactions  . Oxycodone-Acetaminophen Nausea And Vomiting and Rash    PT STATES SHE CAN TOLERATE    Current Outpatient Medications on File Prior to Visit  Medication Sig Dispense Refill  . amLODipine (NORVASC) 10 MG tablet Take 1 tablet (10 mg total) by mouth  daily. 90 tablet 1  . BIOTIN PO Take 1 tablet by mouth daily. Gummy vitamin    . gabapentin (NEURONTIN) 300 MG capsule Take 300 mg by mouth 2 (two) times daily.  1  . ibuprofen (ADVIL,MOTRIN) 800 MG tablet Take 1 tablet (800 mg total) by mouth every 8 (eight) hours as needed. 90 tablet 0  . methocarbamol (ROBAXIN) 500 MG tablet Take 1 tablet (500 mg total) by mouth every 6 (six) hours as needed for muscle spasms. 60 tablet 1  . metoprolol succinate (TOPROL-XL) 25 MG 24 hr tablet Take 1 tablet (25 mg total) by mouth daily. 90 tablet 1  . omeprazole (PRILOSEC) 40 MG capsule Take 1 capsule (40 mg total) by mouth daily as needed. 90 capsule 1  . oxyCODONE-acetaminophen (PERCOCET/ROXICET) 5-325 MG tablet Take 1 tablet by mouth every 4 (four) hours as needed. pain  0  . valACYclovir (VALTREX) 500 MG tablet Take 1 tablet (500 mg  total) by mouth at bedtime. 90 tablet 1   No current facility-administered medications on file prior to visit.     BP 126/86 (BP Location: Right Arm, Cuff Size: Large)   Pulse 71   Temp 98.1 F (36.7 C) (Oral)   Resp 16   Ht 5\' 5"  (1.651 m)   Wt 230 lb 3.2 oz (104.4 kg)   LMP 07/27/2017   SpO2 100%   BMI 38.31 kg/m    Objective:   Physical Exam  Constitutional: She appears well-developed and well-nourished.  HENT:  Head: Normocephalic and atraumatic.  Mouth/Throat: Oropharynx is clear and moist. No oropharyngeal exudate, posterior oropharyngeal edema or posterior oropharyngeal erythema.  Eyes: Conjunctivae are normal. Right eye exhibits no discharge. Left eye exhibits no discharge. No scleral icterus.  Cardiovascular: Normal rate, regular rhythm and normal heart sounds.  No murmur heard. Pulmonary/Chest: Effort normal and breath sounds normal. No respiratory distress. She has no wheezes.  Lymphadenopathy:    She has no cervical adenopathy.  Psychiatric: She has a normal mood and affect. Her behavior is normal. Judgment and thought content normal.          Assessment & Plan:  Viral URI with cough-  sounds to me like the patient has hadl URI with cough-several viral illnesses recently.  They have all resolved prior to beginning another viral illness.  Today she has mild viral symptoms.  She is afebrile.  Symptoms have been present for 2 days.  Exam is normal.  Advised patient to continue supportive measures.  Follow-up if new or worsening symptoms.  A prescription has been sent for Tessalon to use as needed for cough.  Patient verbalizes understanding.

## 2017-12-11 DIAGNOSIS — R600 Localized edema: Secondary | ICD-10-CM | POA: Diagnosis not present

## 2017-12-16 ENCOUNTER — Other Ambulatory Visit: Payer: Self-pay | Admitting: Family

## 2017-12-16 DIAGNOSIS — Z1231 Encounter for screening mammogram for malignant neoplasm of breast: Secondary | ICD-10-CM

## 2017-12-23 MED FILL — OXYCODONE-ACETAMINOPHEN 5-3: 5-325 | 8 days supply | Qty: 50 | Fill #0

## 2018-01-14 ENCOUNTER — Other Ambulatory Visit: Payer: Self-pay | Admitting: Family

## 2018-01-14 MED FILL — METOPROLOL SUCCINATE ER 25: 25 | 90 days supply | Qty: 90 | Fill #1

## 2018-01-14 MED FILL — OMEPRAZOLE DR 40 MG CAPSULE: 40 | 90 days supply | Qty: 90 | Fill #0

## 2018-01-14 MED FILL — AMLODIPINE BESYLATE 10 MG T: 10 | 90 days supply | Qty: 90 | Fill #1

## 2018-01-14 MED FILL — GABAPENTIN 300 MG CAPSULE: 300 | 30 days supply | Qty: 90 | Fill #0

## 2018-01-18 ENCOUNTER — Encounter: Payer: 59 | Admitting: Family

## 2018-01-21 ENCOUNTER — Encounter: Payer: Self-pay | Admitting: Family

## 2018-01-21 ENCOUNTER — Ambulatory Visit (INDEPENDENT_AMBULATORY_CARE_PROVIDER_SITE_OTHER): Payer: 59 | Admitting: Family

## 2018-01-21 VITALS — BP 120/90 | HR 79 | Temp 98.1°F | Resp 18 | Ht 65.0 in | Wt 236.0 lb

## 2018-01-21 DIAGNOSIS — Z0000 Encounter for general adult medical examination without abnormal findings: Secondary | ICD-10-CM | POA: Diagnosis not present

## 2018-01-21 LAB — URINALYSIS, ROUTINE W REFLEX MICROSCOPIC
Bilirubin Urine: NEGATIVE
Hgb urine dipstick: NEGATIVE
Ketones, ur: NEGATIVE
Leukocytes, UA: NEGATIVE
Nitrite: NEGATIVE
RBC / HPF: NONE SEEN (ref 0–?)
Specific Gravity, Urine: 1.02 (ref 1.000–1.030)
Total Protein, Urine: NEGATIVE
Urine Glucose: NEGATIVE
Urobilinogen, UA: 1 (ref 0.0–1.0)
pH: 7 (ref 5.0–8.0)

## 2018-01-21 LAB — CBC WITH DIFFERENTIAL/PLATELET
Basophils Absolute: 0.1 10*3/uL (ref 0.0–0.1)
Basophils Relative: 0.9 % (ref 0.0–3.0)
Eosinophils Absolute: 0.1 10*3/uL (ref 0.0–0.7)
Eosinophils Relative: 1.5 % (ref 0.0–5.0)
HCT: 40.4 % (ref 36.0–46.0)
Hemoglobin: 13.6 g/dL (ref 12.0–15.0)
Lymphocytes Relative: 24.4 % (ref 12.0–46.0)
Lymphs Abs: 1.6 10*3/uL (ref 0.7–4.0)
MCHC: 33.8 g/dL (ref 30.0–36.0)
MCV: 92.9 fl (ref 78.0–100.0)
Monocytes Absolute: 0.5 10*3/uL (ref 0.1–1.0)
Monocytes Relative: 6.9 % (ref 3.0–12.0)
Neutro Abs: 4.4 10*3/uL (ref 1.4–7.7)
Neutrophils Relative %: 66.3 % (ref 43.0–77.0)
Platelets: 295 10*3/uL (ref 150.0–400.0)
RBC: 4.35 Mil/uL (ref 3.87–5.11)
RDW: 13.4 % (ref 11.5–15.5)
WBC: 6.6 10*3/uL (ref 4.0–10.5)

## 2018-01-21 LAB — LIPID PANEL
Cholesterol: 116 mg/dL (ref 0–200)
HDL: 53 mg/dL (ref >39.00)
LDL Cholesterol: 50 mg/dL (ref 0–99)
NonHDL: 62.82
Total CHOL/HDL Ratio: 2
Triglycerides: 66 mg/dL (ref 0.0–149.0)
VLDL: 13.2 mg/dL (ref 0.0–40.0)

## 2018-01-21 LAB — HEPATIC FUNCTION PANEL
ALT: 17 U/L (ref 0–35)
AST: 16 U/L (ref 0–37)
Albumin: 4 g/dL (ref 3.5–5.2)
Alkaline Phosphatase: 72 U/L (ref 39–117)
Bilirubin, Direct: 0.1 mg/dL (ref 0.0–0.3)
Total Bilirubin: 0.5 mg/dL (ref 0.2–1.2)
Total Protein: 7.2 g/dL (ref 6.0–8.3)

## 2018-01-21 LAB — TSH: TSH: 0.76 u[IU]/mL (ref 0.35–4.50)

## 2018-01-21 LAB — BASIC METABOLIC PANEL
BUN: 19 mg/dL (ref 6–23)
CO2: 29 mEq/L (ref 19–32)
Calcium: 9.5 mg/dL (ref 8.4–10.5)
Chloride: 104 mEq/L (ref 96–112)
Creatinine, Ser: 0.71 mg/dL (ref 0.40–1.20)
GFR: 113.26 mL/min (ref >60.00)
Glucose, Bld: 123 mg/dL — ABNORMAL HIGH (ref 70–99)
Potassium: 3.8 mEq/L (ref 3.5–5.1)
Sodium: 140 mEq/L (ref 135–145)

## 2018-01-21 NOTE — Patient Instructions (Signed)
Please complete lab work prior to leaving. Continue to work on healthy diet, exercise and weight loss.  

## 2018-01-21 NOTE — Progress Notes (Signed)
Subjective:    Patient ID: Denise Austin, female    DOB: [birth year], 47 y.o.   MRN: [id number]  HPI   Patient presents today for complete physical.  Immunizations: tdap 2015, 9/18 flu shot Diet: needs improvement Wt Readings from Last 3 Encounters:  01/21/18 236 lb (107 kg)  12/04/17 230 lb 3.2 oz (104.4 kg)  07/08/17 221 lb 9.6 oz (100.5 kg)   Exercise: limited due to back pain Pap Smear: 05/12/17- normal per patient Mammogram: 02/14/17 Vision:  Up to date Dental: today       Review of Systems  Constitutional: Positive for unexpected weight change.  HENT: Negative for hearing loss and rhinorrhea.   Eyes: Negative for visual disturbance.  Respiratory: Negative for cough.   Cardiovascular: Negative for leg swelling.  Gastrointestinal: Negative for blood in stool, constipation and diarrhea.  Genitourinary: Negative for dysuria, frequency and hematuria.  Musculoskeletal: Positive for back pain. Negative for arthralgias and myalgias.  Skin: Negative for rash.  Neurological: Negative for headaches.  Hematological: Negative for adenopathy.  Psychiatric/Behavioral:       Denies depression/anxiety       Past Medical History:  Diagnosis Date  . GERD (gastroesophageal reflux disease)   . Hypertension   . PONV (postoperative nausea and vomiting)   . Uterine fibroid      Social History   Socioeconomic History  . Marital status: Single    Spouse name: Not on file  . Number of children: Not on file  . Years of education: Not on file  . Highest education level: Not on file  Occupational History  . Not on file  Social Needs  . Financial resource strain: Not on file  . Food insecurity:    Worry: Not on file    Inability: Not on file  . Transportation needs:    Medical: Not on file    Non-medical: Not on file  Tobacco Use  . Smoking status: Never Smoker  . Smokeless tobacco: Never Used  Substance and Sexual Activity  . Alcohol use: Yes    Alcohol/week:  0.0 oz    Comment: occasional 1-2 times a month  . Drug use: No  . Sexual activity: Yes    Partners: Male    Birth control/protection: None  Lifestyle  . Physical activity:    Days per week: Not on file    Minutes per session: Not on file  . Stress: Not on file  Relationships  . Social connections:    Talks on phone: Not on file    Gets together: Not on file    Attends religious service: Not on file    Active member of club or organization: Not on file    Attends meetings of clubs or organizations: Not on file    Relationship status: Not on file  . Intimate partner violence:    Fear of current or ex partner: Not on file    Emotionally abused: Not on file    Physically abused: Not on file    Forced sexual activity: Not on file  Other Topics Concern  . 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 BUNIONECTOMY    . CHOLECYSTECTOMY    . LUMBAR LAMINECTOMY/DECOMPRESSION MICRODISCECTOMY Right 04/22/2017   Procedure: Extraforaminal Microdiscectomy - Lumbar two-Lumbar three - right;  Surgeon: Eustace Moore, MD;  Location: [facility];  Service: Neurosurgery;  Laterality: Right;  . WOUND EXPLORATION N/A 05/22/2017   Procedure: Revision Of Lumbar Wound;  Surgeon: Eustace Moore, MD;  Location: [facility];  Service: Neurosurgery;  Laterality: N/A;  Lumbar wound revision    Family History  Problem Relation Age of Onset  . Hypertension Mother   . Diabetes Mother   . Hypertension Father        smoker  . COPD Father   . Single kidney Sister        removed as a child  . Heart attack Neg Hx   . Hyperlipidemia Neg Hx   . Sudden death Neg Hx     Allergies  Allergen Reactions  .  Oxycodone-Acetaminophen Nausea And Vomiting and Rash    PT STATES SHE CAN TOLERATE    Current Outpatient Medications on File Prior to Visit  Medication Sig Dispense Refill  . amLODipine (NORVASC) 10 MG tablet Take 1 tablet (10 mg total) by mouth daily. 90 tablet 1  . BIOTIN PO Take 1 tablet by mouth daily. Gummy vitamin    . gabapentin (NEURONTIN) 300 MG capsule Take 300 mg by mouth 2 (two) times daily.  1  . ibuprofen (ADVIL,MOTRIN) 800 MG tablet Take 1 tablet (800 mg total) by mouth every 8 (eight) hours as needed. 90 tablet 0  . methocarbamol (ROBAXIN) 500 MG tablet Take 1 tablet (500 mg total) by mouth every 6 (six) hours as needed for muscle spasms. 60 tablet 1  . metoprolol succinate (TOPROL-XL) 25 MG 24 hr tablet Take 1 tablet (25 mg total) by mouth daily. 90 tablet 1  . omeprazole (PRILOSEC) 40 MG capsule Take 1 capsule (40 mg total) by mouth daily as needed. 90 capsule 1  . omeprazole (PRILOSEC) 40 MG capsule TAKE 1 CAPSULE (40 MG TOTAL) BY MOUTH DAILY. 90 capsule 1  . oxyCODONE-acetaminophen (PERCOCET/ROXICET) 5-325 MG tablet Take 1 tablet by mouth every 4 (four) hours as needed. pain  0  . valACYclovir (VALTREX) 500 MG tablet Take 1 tablet (500 mg total) by mouth at bedtime. 90 tablet 1   No current facility-administered medications on file prior to visit.     BP 120/90 (BP Location: Right Arm, Cuff Size: Large)   Pulse 79   Temp 98.1 F (36.7 C) (Oral)   Resp 18   Ht 5\' 5"  (1.651 m)   Wt 236 lb (107 kg)   LMP 04/26/2017   SpO2 97%   BMI 39.27 kg/m    Objective:   Physical Exam   Physical Exam  Constitutional: She is oriented to person, place, and time. She appears well-developed and well-nourished. No distress.  HENT:  Head: Normocephalic and atraumatic.  Right Ear: Tympanic membrane and ear canal normal.  Left Ear: Tympanic membrane and ear canal normal.  Mouth/Throat: Oropharynx is clear and moist.  Eyes: Pupils are equal, round, and reactive to light. No  scleral icterus.  Neck: Normal range of motion. No thyromegaly present.  Cardiovascular: Normal rate and regular rhythm.   No murmur heard. Pulmonary/Chest: Effort normal and breath sounds normal. No respiratory distress. He has no wheezes. She has no rales. She exhibits no tenderness.  Abdominal: Soft. Bowel sounds are normal. She exhibits no distension and no mass. There is  no tenderness. There is no rebound and no guarding.  Musculoskeletal: She exhibits no edema.  Lymphadenopathy:    She has no cervical adenopathy.  Neurological: She is alert and oriented to person, place, and time. She has normal patellar reflexes. She exhibits normal muscle tone. Coordination normal.  Skin: Skin is warm and dry.  Psychiatric: She has a normal mood and affect. Her behavior is normal. Judgment and thought content normal.  Breasts: Examined lying Right: Without masses, retractions, discharge or axillary adenopathy.  Left: Without masses, retractions, discharge or axillary adenopathy.  Pelvic:  deferred       Assessment & Plan:           Assessment & Plan:  EKG tracing is personally reviewed.  EKG notes NSR.  No acute changes.  Preventative care- discussed healthy diet, exercise, weight loss. Obtain routine lab work.   Mammo, pap, immunizations up to date.

## 2018-01-22 ENCOUNTER — Other Ambulatory Visit (INDEPENDENT_AMBULATORY_CARE_PROVIDER_SITE_OTHER): Payer: 59

## 2018-01-22 DIAGNOSIS — R739 Hyperglycemia, unspecified: Secondary | ICD-10-CM | POA: Diagnosis not present

## 2018-01-22 LAB — HEMOGLOBIN A1C: Hgb A1c MFr Bld: 5.9 % (ref 4.6–6.5)

## 2018-01-25 ENCOUNTER — Ambulatory Visit (INDEPENDENT_AMBULATORY_CARE_PROVIDER_SITE_OTHER): Payer: 59 | Admitting: Physical Medicine and Rehabilitation

## 2018-01-25 ENCOUNTER — Encounter: Payer: Self-pay | Admitting: Family

## 2018-02-04 NOTE — Telephone Encounter (Signed)
Please contact pt re: unread mychart message. 

## 2018-02-10 NOTE — Telephone Encounter (Signed)
Notified pt and she voices understanding. 

## 2018-02-15 ENCOUNTER — Encounter (INDEPENDENT_AMBULATORY_CARE_PROVIDER_SITE_OTHER): Payer: Self-pay | Admitting: Physical Medicine and Rehabilitation

## 2018-02-15 ENCOUNTER — Ambulatory Visit (INDEPENDENT_AMBULATORY_CARE_PROVIDER_SITE_OTHER): Payer: 59

## 2018-02-15 ENCOUNTER — Ambulatory Visit (INDEPENDENT_AMBULATORY_CARE_PROVIDER_SITE_OTHER): Payer: 59 | Admitting: Physical Medicine and Rehabilitation

## 2018-02-15 DIAGNOSIS — M7061 Trochanteric bursitis, right hip: Secondary | ICD-10-CM

## 2018-02-15 MED FILL — OXYCODONE-ACETAMINOPHEN 5-3: 5-325 | 8 days supply | Qty: 50 | Fill #0

## 2018-02-15 NOTE — Patient Instructions (Signed)

## 2018-02-15 NOTE — Progress Notes (Signed)
CINTIA GLEED - 47 y.o. female MRN [id number]  Date of birth: 03/19/1971  Office Visit Note: Visit Date: 02/15/2018 PCP: Debbrah Alar, NP Referred by: Debbrah Alar, NP  Subjective: Chief Complaint  Patient presents with  . Right Hip - Pain   HPI: Anadalay is a 47 year old female with known problems with chronic right greater trochanteric bursitis.  Prior injection with fluoroscopic guidance was completed in November with good relief for quite a while.  We are going to repeat the injection today diagnostically and therapeutically using fluoroscopic guidance to the body habitus.   ROS Otherwise per HPI.  Assessment & Plan: Visit Diagnoses:  1. Greater trochanteric bursitis, right     Plan: No additional findings.   Meds & Orders: No orders of the defined types were placed in this encounter.   Orders Placed This Encounter  Procedures  . Large Joint Inj: R greater trochanter  . XR C-ARM NO REPORT    Follow-up: Return if symptoms worsen or fail to improve.   Procedures: Large Joint Inj: R greater trochanter on 02/15/2018 1:36 PM Indications: pain and diagnostic evaluation Details: 22 G 3.5 in needle, fluoroscopy-guided lateral approach  Arthrogram: No  Medications: 4 mL lidocaine 2 %; 80 mg triamcinolone acetonide 40 MG/ML; 4 mL bupivacaine 0.25 % Outcome: tolerated well, no immediate complications  There was excellent flow of contrast outlined the greater trochanteric bursa without vascular uptake. Procedure, treatment alternatives, risks and benefits explained, specific risks discussed. Consent was given by the patient. Immediately prior to procedure a time out was called to verify the correct patient, procedure, equipment, support staff and site/side marked as required. Patient was prepped and draped in the usual sterile fashion.      No notes on file   Clinical History: MRI LUMBAR SPINE WITHOUT AND WITH CONTRAST  TECHNIQUE: Multiplanar and  multiecho pulse sequences of the lumbar spine were obtained without and with intravenous contrast.  CONTRAST:  62mL MULTIHANCE GADOBENATE DIMEGLUMINE 529 MG/ML IV SOLN  COMPARISON:  Lumbar spine radiographs July 06, 2017 and MRI of lumbar spine December 09, 2016  FINDINGS: SEGMENTATION: For the purposes of this report, the last well-formed intervertebral disc will be reported as L5-S1.  ALIGNMENT: Maintained lumbar lordosis. No malalignment.  VERTEBRAE:Vertebral bodies are intact. New moderate L2-3 disc height loss with mild desiccation. Remaining lumbar discs demonstrate normal morphology and signal. No suspicious bone marrow signal. No abnormal osseous or disc enhancement.  CONUS MEDULLARIS: Conus medullaris terminates at T12-L1 and demonstrates normal morphology and signal characteristics. Cauda equina is normal. No abnormal cord, leptomeningeal or epidural enhancement.  PARASPINAL AND SOFT TISSUES: RIGHT paraspinal muscles/multifidus postoperative denervation. Mild enhancement along the surgical approach consistent with granulation tissue, no focal fluid collection.  DISC LEVELS:  T12-L1, L1-2: No disc bulge, canal stenosis nor neural foraminal narrowing.  L2-3: Status post RIGHT foraminotomy/facetectomy. Enhancing extraforaminal granulation tissue without recurrent or residual extrusion. No canal stenosis. Soft tissue about the facet and foramen results in moderate RIGHT neural foraminal narrowing and mild mass effect on the exited RIGHT L2 nerve.  L3-4: No disc bulge, canal stenosis nor neural foraminal narrowing. Mild facet arthropathy.  L4-5: Annular bulging, enhancing annular fissure to the RIGHT. Moderate facet arthropathy and ligamentum flavum redundancy without canal stenosis or neural foraminal narrowing. 4 mm LEFT facet synovial cyst within paraspinal soft tissues.  L5-S1: Small broad-based disc bulge enhancing annular fissure. Moderate  facet arthropathy. No canal stenosis or neural foraminal narrowing.  IMPRESSION: 1. Interval RIGHT L2-3 foraminotomy/facetectomy  for discectomy with enhancing granulation tissue along the surgical approach resulting in moderate RIGHT L4-5 neural foraminal narrowing and, mild exited RIGHT L2 nerve impingement. 2. Similar degenerative change of lumbar spine without canal stenosis. Multilevel annular fissures.   Electronically Signed   By: Elon Alas M.D.   On: 07/31/2017 02:49   She reports that she has never smoked. She has never used smokeless tobacco.  Recent Labs    01/22/18 1347  HGBA1C 5.9    Objective:  VS:  HT:    WT:   BMI:     BP:   HR: bpm  TEMP: ( )  RESP:  Physical Exam  Ortho Exam Imaging: Xr C-arm No Report  Result Date: 02/15/2018 Please see Notes or Procedures tab for imaging impression.   Past Medical/Family/Surgical/Social History: Medications & Allergies reviewed per EMR, new medications updated. Patient Active Problem List   Diagnosis Date Noted  . Herpes simplex viral infection 07/10/2017  . S/P lumbar laminectomy 04/22/2017  . GERD (gastroesophageal reflux disease) 07/25/2016  . Hyperglycemia 07/25/2016  . HTN (hypertension) 12/24/2015  . Preventative health care 10/16/2014   Past Medical History:  Diagnosis Date  . GERD (gastroesophageal reflux disease)   . Hypertension   . PONV (postoperative nausea and vomiting)   . Uterine fibroid    Family History  Problem Relation Age of Onset  . Hypertension Mother   . Diabetes Mother   . Hypertension Father        smoker  . COPD Father   . Single kidney Sister        removed as a child  . Heart attack Neg Hx   . Hyperlipidemia Neg Hx   . Sudden death Neg Hx    Past Surgical History:  Procedure Laterality Date  . BUNIONECTOMY    . CHOLECYSTECTOMY    . LUMBAR LAMINECTOMY/DECOMPRESSION MICRODISCECTOMY Right 04/22/2017   Procedure: Extraforaminal Microdiscectomy - Lumbar  two-Lumbar three - right;  Surgeon: Eustace Moore, MD;  Location: [facility];  Service: Neurosurgery;  Laterality: Right;  . WOUND EXPLORATION N/A 05/22/2017   Procedure: Revision Of Lumbar Wound;  Surgeon: Eustace Moore, MD;  Location: [facility];  Service: Neurosurgery;  Laterality: N/A;  Lumbar wound revision   Social History   Occupational History  . Not on file  Tobacco Use  . Smoking status: Never Smoker  . Smokeless tobacco: Never Used  Substance and Sexual Activity  . Alcohol use: Yes    Alcohol/week: 0.0 oz    Comment: occasional 1-2 times a month  . Drug use: No  . Sexual activity: Yes    Partners: Male    Birth control/protection: None

## 2018-02-15 NOTE — Progress Notes (Signed)
 .  Numeric Pain Rating Scale and Functional Assessment Average Pain 7   In the last MONTH (on 0-10 scale) has pain interfered with the following?  1. General activity like being  able to carry out your everyday physical activities such as walking, climbing stairs, carrying groceries, or moving a chair?  Rating(2)   +Driver, -BT, -Dye Allergies.  

## 2018-02-16 MED ORDER — TRIAMCINOLONE ACETONIDE 40 MG/ML IJ SUSP
80.0000 mg | INTRAMUSCULAR | Status: AC | PRN
  Administered 2018-02-15: 80 mg via INTRA_ARTICULAR

## 2018-02-16 MED ORDER — BUPIVACAINE HCL 0.25 % IJ SOLN
4.0000 mL | INTRAMUSCULAR | Status: AC | PRN
  Administered 2018-02-15: 4 mL via INTRA_ARTICULAR

## 2018-02-16 MED ORDER — LIDOCAINE HCL 2 % IJ SOLN
4.0000 mL | INTRAMUSCULAR | Status: AC | PRN
  Administered 2018-02-15: 4 mL

## 2018-02-18 ENCOUNTER — Ambulatory Visit (HOSPITAL_BASED_OUTPATIENT_CLINIC_OR_DEPARTMENT_OTHER): Payer: 59

## 2018-03-08 ENCOUNTER — Other Ambulatory Visit (INDEPENDENT_AMBULATORY_CARE_PROVIDER_SITE_OTHER): Payer: Self-pay | Admitting: Orthopaedic Surgery

## 2018-03-08 MED ORDER — PREDNISONE 10 MG (21) PO TBPK: ORAL_TABLET | ORAL | 0 refills | Status: DC

## 2018-03-08 MED FILL — predniSONE 10 MG TABS: 10 | 6 days supply | Qty: 21 | Fill #0

## 2018-03-09 ENCOUNTER — Encounter: Payer: Self-pay | Admitting: Family

## 2018-03-09 DIAGNOSIS — R0681 Apnea, not elsewhere classified: Secondary | ICD-10-CM

## 2018-03-17 MED FILL — GABAPENTIN 300 MG CAPSULE: 300 | 30 days supply | Qty: 90 | Fill #0

## 2018-03-18 ENCOUNTER — Encounter: Payer: Self-pay | Admitting: Family

## 2018-03-18 NOTE — Telephone Encounter (Signed)
Gwen,   Could you please check status of home sleep study referral?

## 2018-03-30 NOTE — Telephone Encounter (Signed)
Spoke with Brien Few who is in charge of scheduling home sleep studies she stated she would contact the pt this evening or tomorrow to get her scheduled.

## 2018-04-08 ENCOUNTER — Other Ambulatory Visit: Payer: Self-pay | Admitting: Family

## 2018-04-08 ENCOUNTER — Ambulatory Visit (INDEPENDENT_AMBULATORY_CARE_PROVIDER_SITE_OTHER): Payer: 59 | Admitting: Orthopaedic Surgery

## 2018-04-08 ENCOUNTER — Encounter (INDEPENDENT_AMBULATORY_CARE_PROVIDER_SITE_OTHER): Payer: Self-pay | Admitting: Orthopaedic Surgery

## 2018-04-08 ENCOUNTER — Ambulatory Visit (INDEPENDENT_AMBULATORY_CARE_PROVIDER_SITE_OTHER): Payer: 59

## 2018-04-08 DIAGNOSIS — M25551 Pain in right hip: Secondary | ICD-10-CM

## 2018-04-08 MED FILL — OMEPRAZOLE DR 40 MG CAPSULE: 40 | 90 days supply | Qty: 90 | Fill #1 | Status: TO

## 2018-04-08 MED FILL — METOPROLOL SUCCINATE ER 25: 25 | 90 days supply | Qty: 90 | Fill #0

## 2018-04-08 MED FILL — AMLODIPINE BESYLATE 10 MG T: 10 | 90 days supply | Qty: 90 | Fill #0

## 2018-04-08 NOTE — Progress Notes (Signed)
[place]

## 2018-04-12 ENCOUNTER — Ambulatory Visit (INDEPENDENT_AMBULATORY_CARE_PROVIDER_SITE_OTHER): Payer: 59

## 2018-04-12 ENCOUNTER — Ambulatory Visit (INDEPENDENT_AMBULATORY_CARE_PROVIDER_SITE_OTHER): Payer: 59 | Admitting: Physical Medicine and Rehabilitation

## 2018-04-12 ENCOUNTER — Encounter (INDEPENDENT_AMBULATORY_CARE_PROVIDER_SITE_OTHER): Payer: Self-pay | Admitting: Physical Medicine and Rehabilitation

## 2018-04-12 DIAGNOSIS — M25551 Pain in right hip: Secondary | ICD-10-CM

## 2018-04-12 NOTE — Progress Notes (Signed)
Denise Austin - 47 y.o. female MRN [id number]  Date of birth: 1971-06-25  Office Visit Note: Visit Date: 04/12/2018 PCP: Debbrah Alar, NP Referred by: Debbrah Alar, NP  Subjective: Chief Complaint  Patient presents with  . 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  It seems to have declared itself a little bit more referring to the right anterior groin region.  She has had prior greater trochanteric injections with fluoroscopic guidance with mild relief.  She is seeing Dr. Erlinda Hong recently for evaluation and he suggested intra-articular hip injection which we will perform today.  She gets most of her pain with prolonged sitting and get some relief with change of position and medication.  She rates her pain as a 6 out of 10.  The groin pain area and anterior pain really started back in April after she had a trip to [place].   ROS Otherwise per HPI.  Assessment & Plan: Visit Diagnoses:  1. Pain in right hip     Plan: Findings:  Diagnostic note for therapeutic intra-articular hip injection fluoroscopic guidance.  Flow of contrast was excellent patient had some mild relief during the anesthetic phase but not dramatic.  She is going to pay attention to this today she is sitting for prolonged period    Meds & Orders: No orders of the defined types were placed in this encounter.   Orders Placed This Encounter  Procedures  . Large Joint Inj: R hip joint  . XR C-ARM NO REPORT    Follow-up: Return if symptoms worsen or fail to improve, for Dr. Erlinda Hong.   Procedures: Large Joint Inj: R hip joint on 04/12/2018 1:24 PM Indications: diagnostic evaluation and pain Details: 22 G 3.5 in needle, fluoroscopy-guided anterior approach  Arthrogram: No  Medications: 3 mL bupivacaine 0.5 %; 80 mg triamcinolone acetonide 40 MG/ML Outcome: tolerated well, no immediate complications  There was  excellent flow of contrast producing a partial arthrogram of the hip. The patient did have MILD relief of symptoms during the anesthetic phase of the injection. Procedure, treatment alternatives, risks and benefits explained, specific risks discussed. Consent was given by the patient. Immediately prior to procedure a time out was called to verify the correct patient, procedure, equipment, support staff and site/side marked as required. Patient was prepped and draped in the usual sterile fashion.      No notes on file   Clinical History: MRI LUMBAR SPINE WITHOUT AND WITH CONTRAST  TECHNIQUE: Multiplanar and multiecho pulse sequences of the lumbar spine were obtained without and with intravenous contrast.  CONTRAST:  29mL MULTIHANCE GADOBENATE DIMEGLUMINE 529 MG/ML IV SOLN  COMPARISON:  Lumbar spine radiographs July 06, 2017 and MRI of lumbar spine December 09, 2016  FINDINGS: SEGMENTATION: For the purposes of this report, the last well-formed intervertebral disc will be reported as L5-S1.  ALIGNMENT: Maintained lumbar lordosis. No malalignment.  VERTEBRAE:Vertebral bodies are intact. New moderate L2-3 disc height loss with mild desiccation. Remaining lumbar discs demonstrate normal morphology and signal. No suspicious bone marrow signal. No abnormal osseous or disc enhancement.  CONUS MEDULLARIS: Conus medullaris terminates at T12-L1 and demonstrates normal morphology and signal characteristics. Cauda equina is normal. No abnormal cord, leptomeningeal or epidural enhancement.  PARASPINAL AND SOFT TISSUES: RIGHT paraspinal muscles/multifidus postoperative denervation. Mild enhancement along the surgical approach consistent with granulation tissue, no focal fluid collection.  DISC LEVELS:  T12-L1,  L1-2: No disc bulge, canal stenosis nor neural foraminal narrowing.  L2-3: Status post RIGHT foraminotomy/facetectomy. Enhancing extraforaminal granulation tissue  without recurrent or residual extrusion. No canal stenosis. Soft tissue about the facet and foramen results in moderate RIGHT neural foraminal narrowing and mild mass effect on the exited RIGHT L2 nerve.  L3-4: No disc bulge, canal stenosis nor neural foraminal narrowing. Mild facet arthropathy.  L4-5: Annular bulging, enhancing annular fissure to the RIGHT. Moderate facet arthropathy and ligamentum flavum redundancy without canal stenosis or neural foraminal narrowing. 4 mm LEFT facet synovial cyst within paraspinal soft tissues.  L5-S1: Small broad-based disc bulge enhancing annular fissure. Moderate facet arthropathy. No canal stenosis or neural foraminal narrowing.  IMPRESSION: 1. Interval RIGHT L2-3 foraminotomy/facetectomy for discectomy with enhancing granulation tissue along the surgical approach resulting in moderate RIGHT L4-5 neural foraminal narrowing and, mild exited RIGHT L2 nerve impingement. 2. Similar degenerative change of lumbar spine without canal stenosis. Multilevel annular fissures.   Electronically Signed   By: Elon Alas M.D.   On: 07/31/2017 02:49   She reports that she has never smoked. She has never used smokeless tobacco.  Recent Labs    01/22/18 1347  HGBA1C 5.9    Objective:  VS:  HT:    WT:   BMI:     BP:   HR: bpm  TEMP: ( )  RESP:  Physical Exam  Ortho Exam Imaging: Xr C-arm No Report  Result Date: 04/12/2018 Please see Notes tab for imaging impression.   Past Medical/Family/Surgical/Social History: Medications & Allergies reviewed per EMR, new medications updated. Patient Active Problem List   Diagnosis Date Noted  . Herpes simplex viral infection 07/10/2017  . S/P lumbar laminectomy 04/22/2017  . GERD (gastroesophageal reflux disease) 07/25/2016  . Hyperglycemia 07/25/2016  . HTN (hypertension) 12/24/2015  . Preventative health care 10/16/2014   Past Medical History:  Diagnosis Date  . GERD  (gastroesophageal reflux disease)   . Hypertension   . PONV (postoperative nausea and vomiting)   . Uterine fibroid    Family History  Problem Relation Age of Onset  . Hypertension Mother   . Diabetes Mother   . Hypertension Father        smoker  . COPD Father   . Single kidney Sister        removed as a child  . Heart attack Neg Hx   . Hyperlipidemia Neg Hx   . Sudden death Neg Hx    Past Surgical History:  Procedure Laterality Date  . BUNIONECTOMY    . CHOLECYSTECTOMY    . LUMBAR LAMINECTOMY/DECOMPRESSION MICRODISCECTOMY Right 04/22/2017   Procedure: Extraforaminal Microdiscectomy - Lumbar two-Lumbar three - right;  Surgeon: Eustace Moore, MD;  Location: [facility];  Service: Neurosurgery;  Laterality: Right;  . WOUND EXPLORATION N/A 05/22/2017   Procedure: Revision Of Lumbar Wound;  Surgeon: Eustace Moore, MD;  Location: [facility];  Service: Neurosurgery;  Laterality: N/A;  Lumbar wound revision   Social History   Occupational History  . Not on file  Tobacco Use  . Smoking status: Never Smoker  . Smokeless tobacco: Never Used  Substance and Sexual Activity  . Alcohol use: Yes    Alcohol/week: 0.0 oz    Comment: occasional 1-2 times a month  . Drug use: No  . Sexual activity: Yes    Partners: Male    Birth control/protection: None

## 2018-04-12 NOTE — Patient Instructions (Signed)

## 2018-04-12 NOTE — Progress Notes (Signed)
 .  Numeric Pain Rating Scale and Functional Assessment Average Pain 6   In the last MONTH (on 0-10 scale) has pain interfered with the following?  1. General activity like being  able to carry out your everyday physical activities such as walking, climbing stairs, carrying groceries, or moving a chair?  Rating(3)   +Driver, -BT, -Dye Allergies.  

## 2018-04-13 MED ORDER — TRIAMCINOLONE ACETONIDE 40 MG/ML IJ SUSP
80.0000 mg | INTRAMUSCULAR | Status: AC | PRN
  Administered 2018-04-12: 80 mg via INTRA_ARTICULAR

## 2018-04-13 MED ORDER — BUPIVACAINE HCL 0.5 % IJ SOLN
3.0000 mL | INTRAMUSCULAR | Status: AC | PRN
  Administered 2018-04-12: 3 mL via INTRA_ARTICULAR

## 2018-04-14 DIAGNOSIS — G4733 Obstructive sleep apnea (adult) (pediatric): Secondary | ICD-10-CM | POA: Diagnosis not present

## 2018-04-15 ENCOUNTER — Telehealth: Payer: Self-pay | Admitting: Family

## 2018-04-15 ENCOUNTER — Encounter: Payer: Self-pay | Admitting: Family

## 2018-04-15 DIAGNOSIS — G4730 Sleep apnea, unspecified: Secondary | ICD-10-CM

## 2018-04-15 DIAGNOSIS — G4733 Obstructive sleep apnea (adult) (pediatric): Secondary | ICD-10-CM | POA: Diagnosis not present

## 2018-04-15 HISTORY — DX: Obstructive sleep apnea (adult) (pediatric): G47.33

## 2018-04-15 NOTE — Telephone Encounter (Signed)
Please contact patient and let her know that her home sleep study showed severe sleep apnea.  I would like for her to have a sleep study in the lab so they can work on getting her settings optimized for home CPAP.  She should be contacted about scheduling this.

## 2018-04-15 NOTE — Telephone Encounter (Signed)
Advised patient of results and that someone will contact her about the sleep study in the lab.

## 2018-04-16 ENCOUNTER — Other Ambulatory Visit: Payer: Self-pay | Admitting: Family

## 2018-04-16 DIAGNOSIS — G4733 Obstructive sleep apnea (adult) (pediatric): Secondary | ICD-10-CM

## 2018-04-16 NOTE — Progress Notes (Signed)
HST order redone so results can be released [facility]  04/16/18

## 2018-04-17 ENCOUNTER — Ambulatory Visit (HOSPITAL_BASED_OUTPATIENT_CLINIC_OR_DEPARTMENT_OTHER)
Admission: RE | Admit: 2018-04-17 | Discharge: 2018-04-17 | Disposition: A | Discharge status: Home / Self Care | Payer: 59 | Source: Ambulatory Visit | Attending: Family | Admitting: Family

## 2018-04-17 DIAGNOSIS — Z1231 Encounter for screening mammogram for malignant neoplasm of breast: Secondary | ICD-10-CM | POA: Insufficient documentation

## 2018-05-14 ENCOUNTER — Encounter (HOSPITAL_BASED_OUTPATIENT_CLINIC_OR_DEPARTMENT_OTHER): Payer: 59

## 2018-05-25 ENCOUNTER — Telehealth (INDEPENDENT_AMBULATORY_CARE_PROVIDER_SITE_OTHER): Payer: Self-pay | Admitting: Physical Medicine and Rehabilitation

## 2018-05-26 ENCOUNTER — Other Ambulatory Visit (INDEPENDENT_AMBULATORY_CARE_PROVIDER_SITE_OTHER): Payer: Self-pay | Admitting: Physical Medicine and Rehabilitation

## 2018-05-26 MED ORDER — GABAPENTIN 300 MG PO CAPS: 300.0000 mg | ORAL_CAPSULE | Freq: Two times a day (BID) | ORAL | 4 refills | Status: DC

## 2018-05-26 MED FILL — GABAPENTIN 300 MG CAPSULE: 300 | 30 days supply | Qty: 60 | Fill #0 | Status: TO

## 2018-05-28 DIAGNOSIS — Z01419 Encounter for gynecological examination (general) (routine) without abnormal findings: Secondary | ICD-10-CM | POA: Diagnosis not present

## 2018-05-31 NOTE — Telephone Encounter (Signed)
Prescription sent

## 2018-06-04 ENCOUNTER — Ambulatory Visit (HOSPITAL_BASED_OUTPATIENT_CLINIC_OR_DEPARTMENT_OTHER): Payer: 59 | Attending: Family | Admitting: Pulmonary Disease

## 2018-06-04 VITALS — Ht 65.0 in | Wt 215.0 lb

## 2018-06-04 DIAGNOSIS — G4733 Obstructive sleep apnea (adult) (pediatric): Secondary | ICD-10-CM | POA: Insufficient documentation

## 2018-06-04 DIAGNOSIS — G4730 Sleep apnea, unspecified: Secondary | ICD-10-CM

## 2018-06-08 ENCOUNTER — Telehealth: Payer: Self-pay | Admitting: Family

## 2018-06-08 DIAGNOSIS — G4733 Obstructive sleep apnea (adult) (pediatric): Secondary | ICD-10-CM

## 2018-06-08 NOTE — Procedures (Signed)
  Patient Name: Denise Austin, Denise Austin Date: 06/04/2018   Gender: Female  D.O.B: 06/07/1971  Age (years): 46  Referring Provider: Earlie Counts  Height (inches): 65  Interpreting Physician: Kara Mead MD, ABSM  Weight (lbs): 215  RPSGT: Baxter Flattery  BMI: 36  MRN: [id number]  Neck Size: 16.00  <br> <br>  CLINICAL INFORMATION  The patient is referred for a CPAP titration to treat sleep apnea. Date of HST: 03/2018, AHI 69/h SLEEP STUDY TECHNIQUE  As per the AASM Manual for the Scoring of Sleep and Associated Events v2.3 (April 2016) with a hypopnea requiring 4% desaturations. The channels recorded and monitored were frontal, central and occipital EEG, electrooculogram (EOG), submentalis EMG (chin), nasal and oral airflow, thoracic and abdominal wall motion, anterior tibialis EMG, snore microphone, electrocardiogram, and pulse oximetry. Continuous positive airway pressure (CPAP) was initiated at the beginning of the study and titrated to treat sleep-disordered breathing. MEDICATIONS  Medications self-administered by patient taken the night of the study : GABAPENTIN, AMLODIPINE, METOPROLOL, OMEPRAZOLE, VALACYCLOVIR, MELATONIN TECHNICIAN COMMENTS  Comments added by technician: PATIENT TOLERATED THE MASK  RESPIRATORY PARAMETERS  Optimal PAP Pressure (cm): 9 AHI at Optimal Pressure (/hr): 0.6  Overall Minimal O2 (%): 89.0 Supine % at Optimal Pressure (%): 42  Minimal O2 at Optimal Pressure (%): 91.0      SLEEP ARCHITECTURE  The study was initiated at 10:16:35 PM and ended at 4:34:51 AM. Sleep onset time was 3.3 minutes and the sleep efficiency was 94.6%%. The total sleep time was 358 minutes. The patient spent 4.3%% of the night in stage N1 sleep, 71.5%% in stage N2 sleep, 0.0%% in stage N3 and 24.2% in REM.Stage REM latency was 81.0 minutes Wake after sleep onset was 16.9. Alpha intrusion was absent. Supine sleep was 11.73%. CARDIAC DATA  The 2 lead EKG demonstrated sinus rhythm.  The mean heart rate was 68.7 beats per minute. Other EKG findings include: None.  LEG MOVEMENT DATA  The total Periodic Limb Movements of Sleep (PLMS) were 0. The PLMS index was 0.0. A PLMS index of <15 is considered normal in adults. IMPRESSIONS  - The optimal PAP pressure was 9 cm of water.  - Central sleep apnea was not noted during this titration (CAI = 0.3/h).  - Mild oxygen desaturations were observed during this titration (min O2 = 89.0%).  - No snoring was audible during this study.  - No cardiac abnormalities were observed during this study.  - Clinically significant periodic limb movements were not noted during this study. Arousals associated with PLMs were rare. DIAGNOSIS  - Obstructive Sleep Apnea (327.23 [G47.33 ICD-10]) RECOMMENDATIONS  - Trial of CPAP therapy on 9 cm H2O with a Small size [employer]ips Respironics Nasal Pillow Mask DreamWear Gel mask and heated humidification. - Avoid alcohol, sedatives and other CNS depressants that may worsen sleep apnea and disrupt normal sleep architecture.  - Sleep hygiene should be reviewed to assess factors that may improve sleep quality.  - Weight management and regular exercise should be initiated or continued.  - Return to [facility] Center for re-evaluation after 4 weeks of therapy     Kara Mead MD Board Certified in [place]

## 2018-06-08 NOTE — Telephone Encounter (Signed)
See my chart message

## 2018-06-21 ENCOUNTER — Encounter: Payer: Self-pay | Admitting: Family

## 2018-06-22 NOTE — Telephone Encounter (Signed)
Per [facility], face to face OV is needed with PCP. Sent mychart message to pt to schedule appt.

## 2018-06-23 ENCOUNTER — Encounter: Payer: Self-pay | Admitting: Family

## 2018-06-23 NOTE — Telephone Encounter (Signed)
Melissa -- you are at your session limit on Friday. Is it ok to work pt in at 46? If so, can you respond to the pt via mychart since we will not be in the office tomorrow and I am unsure if we will have coverage. Thanks!

## 2018-06-23 NOTE — Telephone Encounter (Signed)
OK, please add her to my 7 am slot.  I sent her a message.

## 2018-06-25 ENCOUNTER — Encounter: Payer: Self-pay | Admitting: Family

## 2018-06-25 ENCOUNTER — Ambulatory Visit: Payer: 59 | Admitting: Family

## 2018-06-25 VITALS — BP 116/87 | HR 88 | Temp 98.4°F | Resp 18 | Ht 65.0 in | Wt 233.0 lb

## 2018-06-25 DIAGNOSIS — G4733 Obstructive sleep apnea (adult) (pediatric): Secondary | ICD-10-CM | POA: Diagnosis not present

## 2018-06-25 NOTE — Telephone Encounter (Signed)
Face to Face encounter faxed to [facility] @ [phone].

## 2018-06-25 NOTE — Progress Notes (Signed)
Subjective:    Patient ID: Denise Austin, female    DOB: 07-07-1971, 47 y.o.   MRN: [id number]  HPI   Denise Austin is a 47 yr old female who presents today to discuss OSA.  She underwent a CPAP titration study on 06/04/18.  Results were as follows:     IMPRESSIONS   - The optimal PAP pressure was 9 cm of water.   - Central sleep apnea was not noted during this titration (CAI = 0.3/h).   - Mild oxygen desaturations were observed during this titration (min O2 = 89.0%).   - No snoring was audible during this study.   - No cardiac abnormalities were observed during this study.   - Clinically significant periodic limb movements were not noted during this study. Arousals associated with PLMs were rare.  A referral was placed for Cpap on 06/08/18 and her insurance is requiring a face to face visit before they will initiate home cpap. + daytime somnolence.+ loud snoring. Dozes off while watching TV.  Does not have the energy to do activities she used to enjoy. No longer walking during lunch.     Wt Readings from Last 3 Encounters:  06/25/18 233 lb (105.7 kg)  06/05/18 215 lb (97.5 kg)  01/21/18 236 lb (107 kg)     Review of Systems    see HPI  Past Medical History:  Diagnosis Date  . GERD (gastroesophageal reflux disease)   . Hypertension   . OSA (obstructive sleep apnea) 04/15/2018  . PONV (postoperative nausea and vomiting)   . Uterine fibroid      Social History   Socioeconomic History  . Marital status: Single    Spouse name: Not on file  . Number of children: Not on file  . Years of education: Not on file  . Highest education level: Not on file  Occupational History  . Not on file  Social Needs  . Financial resource strain: Not on file  . Food insecurity:    Worry: Not on file    Inability: Not on file  . Transportation needs:    Medical: Not on file    Non-medical: Not on file  Tobacco Use  . Smoking status: Never Smoker  . Smokeless tobacco: Never  Used  Substance and Sexual Activity  . Alcohol use: Yes    Alcohol/week: 0.0 standard drinks    Comment: occasional 1-2 times a month  . Drug use: No  . Sexual activity: Yes    Partners: Male    Birth control/protection: None  Lifestyle  . Physical activity:    Days per week: Not on file    Minutes per session: Not on file  . Stress: Not on file  Relationships  . Social connections:    Talks on phone: Not on file    Gets together: Not on file    Attends religious service: Not on file    Active member of club or organization: Not on file    Attends meetings of clubs or organizations: Not on file    Relationship status: Not on file  . Intimate partner violence:    Fear of current or ex partner: Not on file    Emotionally abused: Not on file    Physically abused: Not on file    Forced sexual activity: Not on file  Other Topics Concern  . 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 BUNIONECTOMY    . CHOLECYSTECTOMY    . LUMBAR LAMINECTOMY/DECOMPRESSION MICRODISCECTOMY Right 04/22/2017   Procedure: Extraforaminal Microdiscectomy - Lumbar two-Lumbar three - right;  Surgeon: Eustace Moore, MD;  Location: [facility];  Service: Neurosurgery;  Laterality: Right;  . lumbar surgery revision  04/24/2017  . WOUND EXPLORATION N/A 05/22/2017   Procedure: Revision Of Lumbar Wound;  Surgeon: Eustace Moore, MD;  Location: [facility];  Service: Neurosurgery;  Laterality: N/A;  Lumbar wound revision    Family History  Problem Relation Age of Onset  . Hypertension Mother   . Diabetes Mother   . Hypertension Father        smoker  . COPD Father   . Single kidney Sister        removed as a  child  . Heart attack Neg Hx   . Hyperlipidemia Neg Hx   . Sudden death Neg Hx     Allergies  Allergen Reactions  . Oxycodone-Acetaminophen Nausea And Vomiting and Rash    PT STATES SHE CAN TOLERATE    Current Outpatient Medications on File Prior to Visit  Medication Sig Dispense Refill  . amLODipine (NORVASC) 10 MG tablet TAKE 1 TABLET BY MOUTH DAILY. 90 tablet 1  . BIOTIN PO Take 1 tablet by mouth daily. Gummy vitamin    . gabapentin (NEURONTIN) 300 MG capsule Take 1 capsule (300 mg total) by mouth 2 (two) times daily. 60 capsule 4  . ibuprofen (ADVIL,MOTRIN) 800 MG tablet Take 1 tablet (800 mg total) by mouth every 8 (eight) hours as needed. 90 tablet 0  . omeprazole (PRILOSEC) 40 MG capsule Take 1 capsule (40 mg total) by mouth daily as needed. 90 capsule 1  . omeprazole (PRILOSEC) 40 MG capsule TAKE 1 CAPSULE (40 MG TOTAL) BY MOUTH DAILY. 90 capsule 1  . valACYclovir (VALTREX) 500 MG tablet Take 1 tablet (500 mg total) by mouth at bedtime. 90 tablet 1   No current facility-administered medications on file prior to visit.     BP 116/87 (BP Location: Right Arm, Cuff Size: Large)   Pulse 88   Temp 98.4 F (36.9 C) (Oral)   Resp 18   Ht 5\' 5"  (1.651 m)   Wt 233 lb (105.7 kg)   SpO2 98%   BMI 38.77 kg/m    Objective:   Physical Exam  Constitutional: She is oriented to person, place, and time. She appears well-developed and well-nourished.  Cardiovascular: Normal rate, regular rhythm and normal heart sounds.  No murmur heard. Pulmonary/Chest: Effort normal and breath sounds normal. No respiratory distress. She has no wheezes.  Neurological: She is alert and oriented to person, place, and time.  Skin: Skin is warm and dry.  Psychiatric: She has a normal mood and affect. Her behavior is normal. Judgment and thought content normal.          Assessment & Plan:  OSA- symptomatic. Needs CPAP. Advised pt that we would send today's office note to home health to satisfy  their request for face to face visit and that I would like to see her back in 1 month. We also discussed importance of diet/exercise/weight loss.   A total of 15  minutes were spent face-to-face with the patient during this encounter and over half of that time was spent on counseling and coordination of care. The patient was counseled on OSA treatment, diet, exercise, weight loss.

## 2018-06-25 NOTE — Patient Instructions (Signed)
We will work on getting your cpap set up for you. I would like to see you back in the office for follow up 1 month after you start your cpap.

## 2018-07-02 ENCOUNTER — Telehealth: Payer: Self-pay | Admitting: *Deleted

## 2018-07-02 ENCOUNTER — Ambulatory Visit: Payer: 59 | Admitting: Family

## 2018-07-02 DIAGNOSIS — G4733 Obstructive sleep apnea (adult) (pediatric): Secondary | ICD-10-CM

## 2018-07-02 NOTE — Telephone Encounter (Signed)
Attempted to reach pt and left detailed message on her voicemail regarding below status and to call and confirm when she is ready for [place] to place home sleep study order.

## 2018-07-02 NOTE — Telephone Encounter (Signed)
Jiles Crocker  [place], [place]; [place], Consuello Bossier, CMA; Jiles Crocker        Juliene Pina!   I took a look at the pt's account and spoke with our Cpap Specialist. This pt's insurance requires a copy of the F2F (office notes) prior to the sleep study. The notes will need to explain the need for the Sleep Study. From what I can tell, from looking at the notes in Epic, this visit never took place. The pt will need to have another sleep study per insurance. We can use the 06/25/2018 as the "Prior F2F" once the new sleep study is completed. Please understand that this is what her insurance requires. I hope this clears up any confusion. Give me a call if you have any questions. [phone]. Thank you!

## 2018-07-05 NOTE — Telephone Encounter (Signed)
Attempted to reach pt. Sent mychart message to confirm pt received below message and to see if she is ready to proceed with another home sleep study. [place] for [facility] / triage to discuss with pt.

## 2018-07-07 ENCOUNTER — Other Ambulatory Visit: Payer: Self-pay | Admitting: Family

## 2018-07-07 MED FILL — GABAPENTIN 300 MG CAPSULE: 300 | 30 days supply | Qty: 60 | Fill #0

## 2018-07-23 ENCOUNTER — Ambulatory Visit: Payer: 59 | Admitting: Family

## 2018-07-26 ENCOUNTER — Ambulatory Visit: Payer: 59 | Admitting: Family

## 2018-07-30 ENCOUNTER — Encounter: Payer: Self-pay | Admitting: Family

## 2018-07-30 ENCOUNTER — Ambulatory Visit: Payer: 59 | Admitting: Family

## 2018-07-30 VITALS — BP 112/72 | HR 69 | Temp 98.6°F | Resp 16 | Ht 65.0 in | Wt 235.0 lb

## 2018-07-30 DIAGNOSIS — I10 Essential (primary) hypertension: Secondary | ICD-10-CM | POA: Diagnosis not present

## 2018-07-30 DIAGNOSIS — G4733 Obstructive sleep apnea (adult) (pediatric): Secondary | ICD-10-CM | POA: Diagnosis not present

## 2018-07-30 DIAGNOSIS — K219 Gastro-esophageal reflux disease without esophagitis: Secondary | ICD-10-CM

## 2018-07-30 NOTE — Patient Instructions (Addendum)
Please call [facility] to set up home sleep study.  [phone] Stop omeprazole and see if you can tolerate coming off.  If not, let me know and we can restart. Come see me 1 month after you start cpap.

## 2018-07-30 NOTE — Progress Notes (Signed)
Subjective:    Patient ID: Denise Austin, female    DOB: 10-04-1971, 46 y.o.   MRN: [id number]  HPI  Patient is a 47 yr old female who presents today for follow up.  OSA-she reports that she was not contacted by pulmonology to schedule her follow-up home sleep study.  Her insurance is requiring her to repeat her sleep study before they will provide her with a CPAP machine.  She notes that she has been quite busy lately and has been out of work because she has been tending to her son who had a severe motor vehicle accident in the end of the summer.  He is starting to improve and she has since returned to work.  HTN- maintained on amlodipine.    BP Readings from Last 3 Encounters:  07/30/18 112/72  06/25/18 116/87  01/21/18 120/90   gerd- reports that her symptoms are well controlled.  She does occasionally skip a few days of her omeprazole without return of her symptoms.  Review of Systems    see HPI  Past Medical History:  Diagnosis Date  . GERD (gastroesophageal reflux disease)   . Hypertension   . OSA (obstructive sleep apnea) 04/15/2018  . PONV (postoperative nausea and vomiting)   . Uterine fibroid      Social History   Socioeconomic History  . Marital status: Single    Spouse name: Not on file  . Number of children: Not on file  . Years of education: Not on file  . Highest education level: Not on file  Occupational History  . Not on file  Social Needs  . Financial resource strain: Not on file  . Food insecurity:    Worry: Not on file    Inability: Not on file  . Transportation needs:    Medical: Not on file    Non-medical: Not on file  Tobacco Use  . Smoking status: Never Smoker  . Smokeless tobacco: Never Used  Substance and Sexual Activity  . Alcohol use: Yes    Alcohol/week: 0.0 standard drinks    Comment: occasional 1-2 times a month  . Drug use: No  . Sexual activity: Yes    Partners: Male    Birth control/protection: None  Lifestyle  .  Physical activity:    Days per week: Not on file    Minutes per session: Not on file  . Stress: Not on file  Relationships  . Social connections:    Talks on phone: Not on file    Gets together: Not on file    Attends religious service: Not on file    Active member of club or organization: Not on file    Attends meetings of clubs or organizations: Not on file    Relationship status: Not on file  . Intimate partner violence:    Fear of current or ex partner: Not on file    Emotionally abused: Not on file    Physically abused: Not on file    Forced sexual activity: Not on file  Other Topics Concern  . 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  BUNIONECTOMY    . CHOLECYSTECTOMY    . LUMBAR LAMINECTOMY/DECOMPRESSION MICRODISCECTOMY Right 04/22/2017   Procedure: Extraforaminal Microdiscectomy - Lumbar two-Lumbar three - right;  Surgeon: Eustace Moore, MD;  Location: [facility];  Service: Neurosurgery;  Laterality: Right;  . lumbar surgery revision  04/24/2017  . WOUND EXPLORATION N/A 05/22/2017   Procedure: Revision Of Lumbar Wound;  Surgeon: Eustace Moore, MD;  Location: [facility];  Service: Neurosurgery;  Laterality: N/A;  Lumbar wound revision    Family History  Problem Relation Age of Onset  . Hypertension Mother   . Diabetes Mother   . Hypertension Father        smoker  . COPD Father   . Single kidney Sister        removed as a child  . Heart attack Neg Hx   . Hyperlipidemia Neg Hx   . Sudden death Neg Hx     Allergies  Allergen Reactions  . Oxycodone-Acetaminophen Nausea And Vomiting and Rash    PT STATES SHE CAN TOLERATE    Current Outpatient Medications on File  Prior to Visit  Medication Sig Dispense Refill  . amLODipine (NORVASC) 10 MG tablet TAKE 1 TABLET BY MOUTH DAILY. 90 tablet 1  . BIOTIN PO Take 1 tablet by mouth daily. Gummy vitamin    . gabapentin (NEURONTIN) 300 MG capsule Take 1 capsule (300 mg total) by mouth 2 (two) times daily. 60 capsule 4  . ibuprofen (ADVIL,MOTRIN) 800 MG tablet Take 1 tablet (800 mg total) by mouth every 8 (eight) hours as needed. 90 tablet 0  . omeprazole (PRILOSEC) 40 MG capsule TAKE 1 CAPSULE BY MOUTH DAILY. 90 capsule 1  . valACYclovir (VALTREX) 500 MG tablet Take 1 tablet (500 mg total) by mouth at bedtime. 90 tablet 1   No current facility-administered medications on file prior to visit.     BP 112/72 (BP Location: Right Arm, Patient Position: Sitting, Cuff Size: Large)   Pulse 69   Temp 98.6 F (37 C) (Oral)   Resp 16   Ht 5\' 5"  (1.651 m)   Wt 235 lb (106.6 kg)   SpO2 99%   BMI 39.11 kg/m    Objective:   Physical Exam  Constitutional: She appears well-developed and well-nourished.  Cardiovascular: Normal rate, regular rhythm and normal heart sounds.  No murmur heard. Pulmonary/Chest: Effort normal and breath sounds normal. No respiratory distress. She has no wheezes.  Psychiatric: She has a normal mood and affect. Her behavior is normal. Judgment and thought content normal.          Assessment & Plan:  Hypertension- blood pressure stable on amlodipine.  Continue same.  Obstructive sleep apnea- I have reordered the home sleep study and given her the number to call to set this up with the [facility] pulmonology.  Once we get this report back we can formally order CPAP.  She understands that I will need to see her back in the office after she begin CPAP.  GERD-she reports that she has missed several days in a row before of her omeprazole without return of her reflux symptoms.  I have advised her on a trial off of omeprazole to see how she does.  We reinforced GERD precautions today.

## 2018-08-11 IMAGING — CR DG CHEST 2V
2 series · 2 of 2 positions shown · non-contrast
Comparison: None.

CLINICAL DATA: Preop for microdiskectomy

EXAM:
CHEST  2 VIEW

[w chest pa]
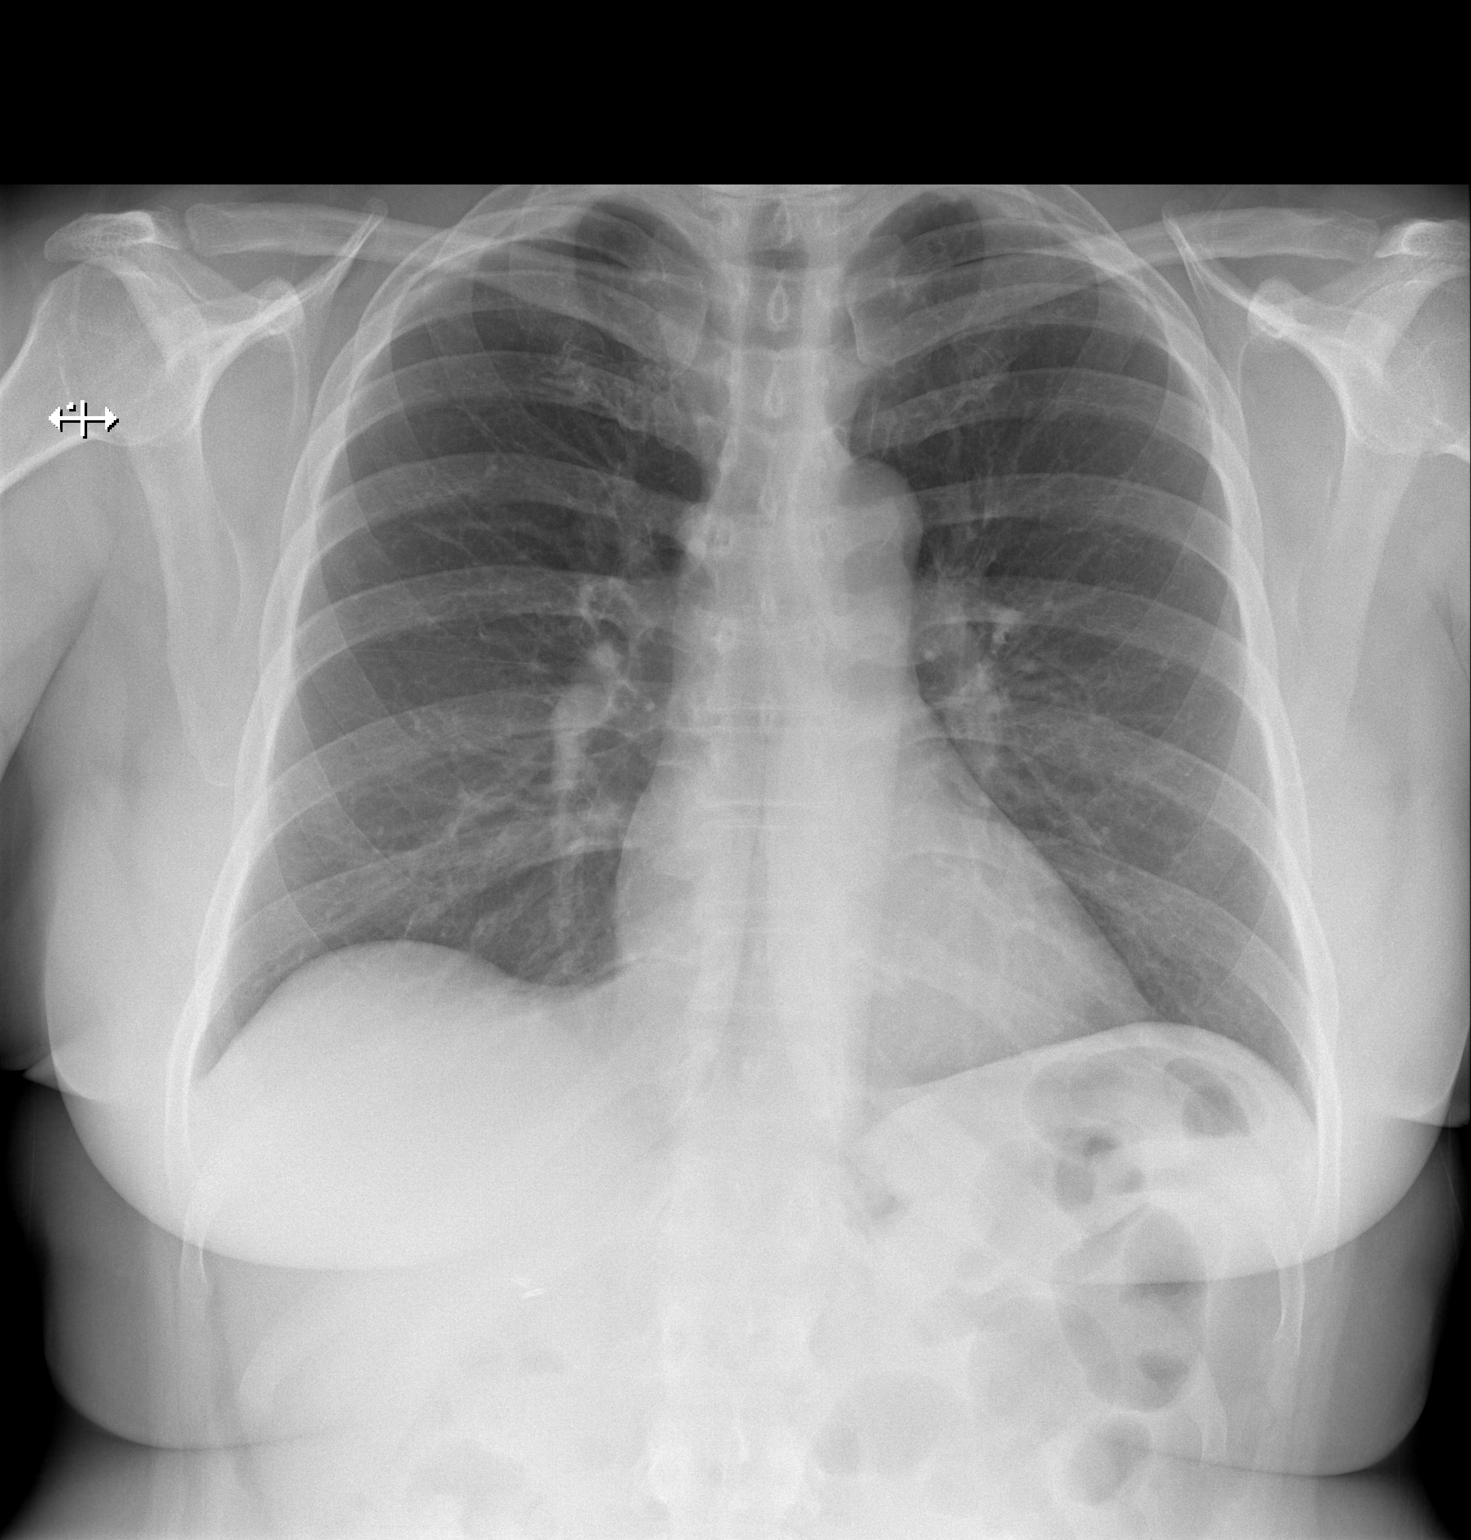

[w chest lat]
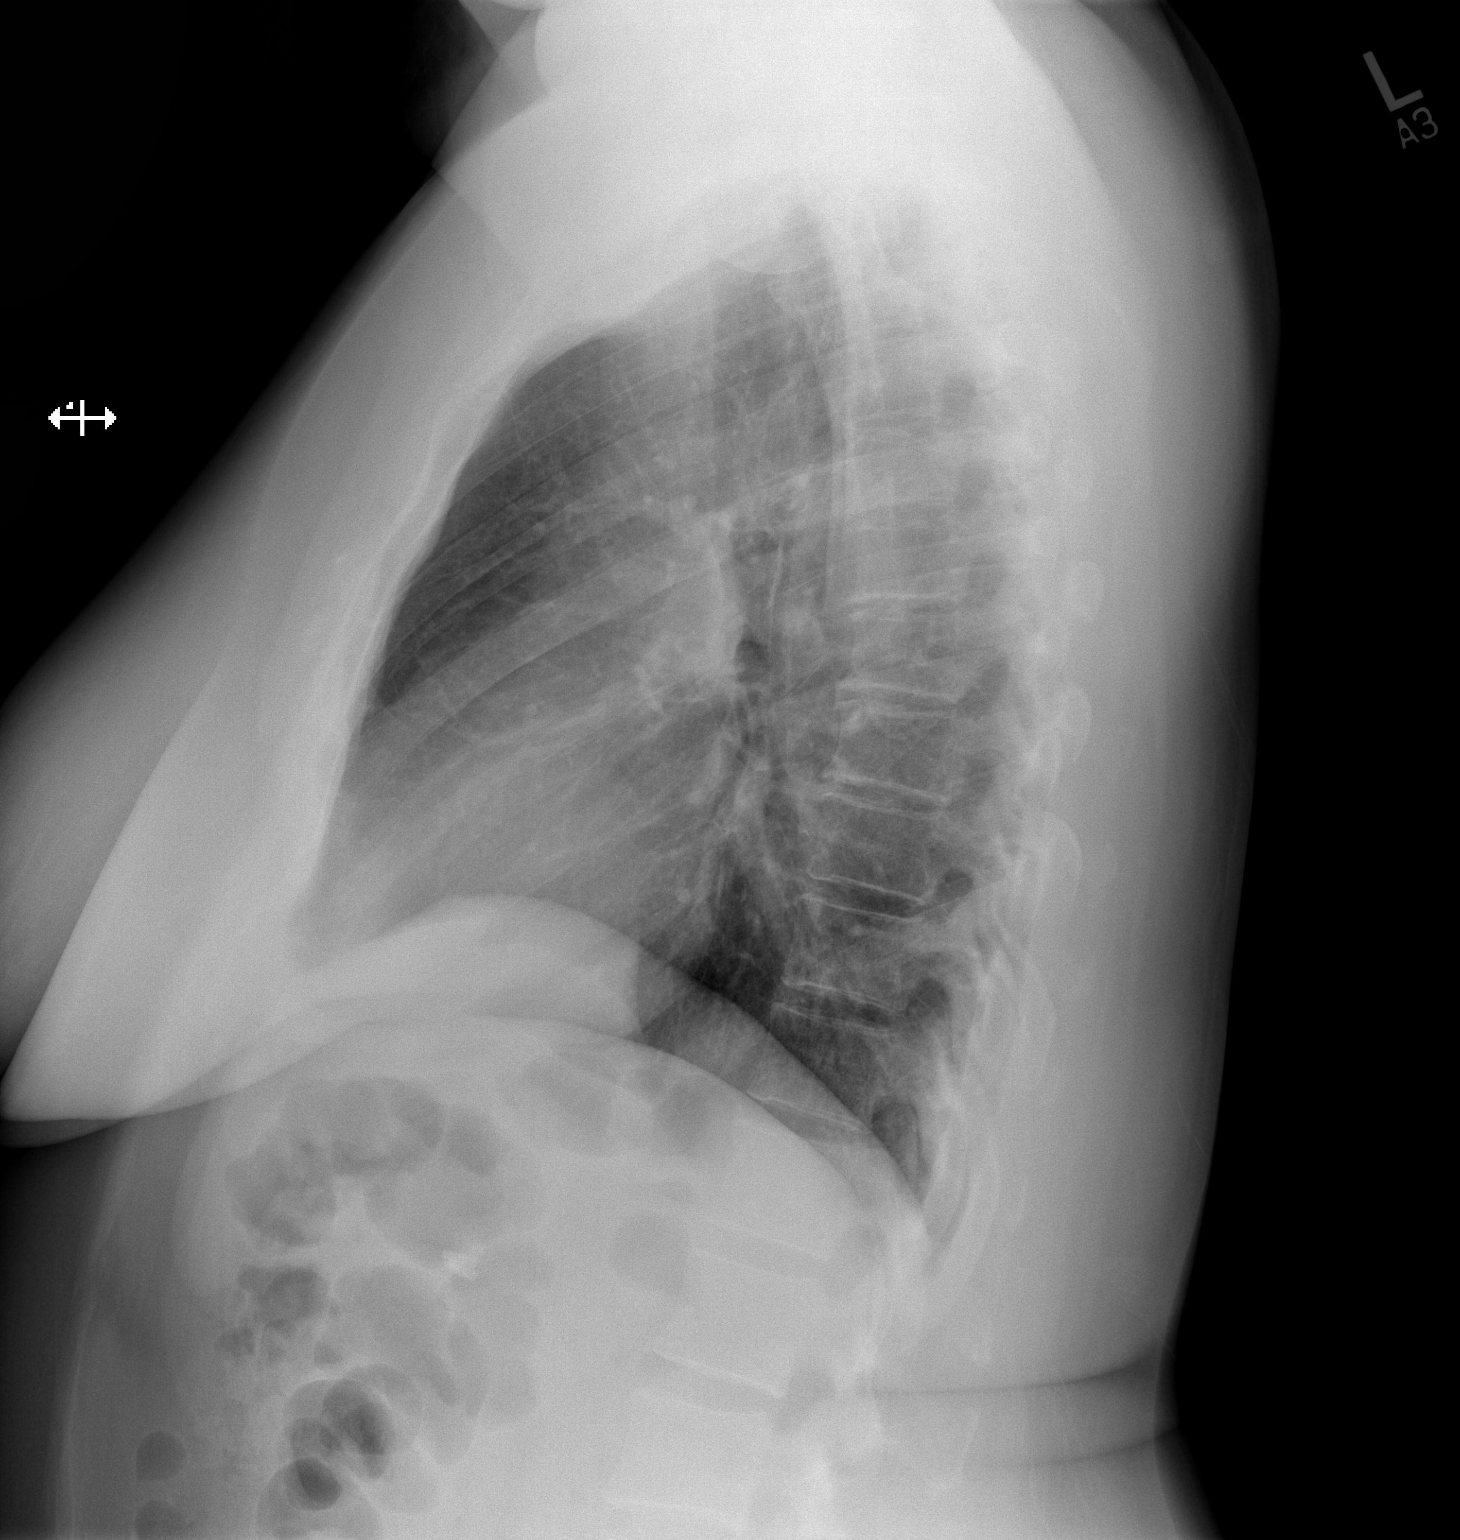

[2 of 2 positions shown; findings below may reference images not displayed]

FINDINGS: No active infiltrate or effusion is seen. Mediastinal and hilar
contours are unremarkable. The heart is within limits in size. No
bony abnormality is seen surgical clips are present in the right
upper quadrant from prior cholecystectomy.
IMPRESSION: No active cardiopulmonary disease.

## 2018-08-25 MED FILL — GABAPENTIN 300 MG CAPSULE: 300 | 30 days supply | Qty: 60 | Fill #0

## 2018-09-06 ENCOUNTER — Other Ambulatory Visit (INDEPENDENT_AMBULATORY_CARE_PROVIDER_SITE_OTHER): Payer: Self-pay

## 2018-09-06 MED ORDER — PREDNISONE 10 MG (21) PO TBPK: ORAL_TABLET | ORAL | 0 refills | Status: DC

## 2018-09-06 MED FILL — predniSONE 10 MG TABS: 10 | 6 days supply | Qty: 21 | Fill #0

## 2018-09-08 ENCOUNTER — Telehealth: Payer: Self-pay | Admitting: Family

## 2018-09-08 NOTE — Telephone Encounter (Signed)
-----   Message from Ilona Sorrel sent at 09/08/2018 10:30 AM EST ----- Regarding: home sleep study FYI - You placed an order on 07/30/18 for pt to have a home sleep study.  I scheduled her for 10/30 and she did not show.  I have left her 3 vm's to reschedule with no response.     Judeen Hammans

## 2018-10-07 ENCOUNTER — Other Ambulatory Visit: Payer: Self-pay | Admitting: Family

## 2018-10-07 MED FILL — VALACYCLOVIR HCL 500 MG TAB: 500 | 90 days supply | Qty: 90 | Fill #0

## 2018-10-11 MED FILL — AMLODIPINE BESYLATE 10 MG T: 10 | 90 days supply | Qty: 90 | Fill #0

## 2018-10-11 MED FILL — METOPROLOL SUCCINATE ER 25: 25 | 90 days supply | Qty: 90 | Fill #0

## 2018-10-28 ENCOUNTER — Other Ambulatory Visit (INDEPENDENT_AMBULATORY_CARE_PROVIDER_SITE_OTHER): Payer: Self-pay

## 2018-10-28 ENCOUNTER — Telehealth (INDEPENDENT_AMBULATORY_CARE_PROVIDER_SITE_OTHER): Payer: Self-pay

## 2018-10-28 MED ORDER — IBUPROFEN 800 MG PO TABS: 800.0000 mg | ORAL_TABLET | Freq: Three times a day (TID) | ORAL | 0 refills | Status: DC | PRN

## 2018-10-28 MED FILL — IBUPROFEN 800 MG TAB: 800 | 30 days supply | Qty: 90 | Fill #0

## 2018-10-28 NOTE — Telephone Encounter (Signed)
Patient would like a RF on ibuprofen 800 mg   Uses [facility]

## 2018-10-28 NOTE — Telephone Encounter (Signed)
Ok to do

## 2018-10-28 NOTE — Telephone Encounter (Signed)
Sent in to pharm.

## 2018-11-30 MED FILL — GABAPENTIN 300 MG CAPSULE: 300 | 30 days supply | Qty: 60 | Fill #1

## 2018-11-30 MED FILL — OMEPRAZOLE 40 MG CPDR: 40 | 90 days supply | Qty: 90 | Fill #1

## 2018-12-31 DIAGNOSIS — H5213 Myopia, bilateral: Secondary | ICD-10-CM | POA: Diagnosis not present

## 2019-01-06 ENCOUNTER — Other Ambulatory Visit (INDEPENDENT_AMBULATORY_CARE_PROVIDER_SITE_OTHER): Payer: Self-pay | Admitting: Physician Assistant

## 2019-01-06 MED ORDER — PREDNISONE 10 MG (21) PO TBPK: ORAL_TABLET | ORAL | 0 refills | Status: DC

## 2019-01-06 MED FILL — predniSONE 10 MG TABS: 10 | 6 days supply | Qty: 21 | Fill #0

## 2019-01-10 ENCOUNTER — Other Ambulatory Visit: Payer: Self-pay | Admitting: Family

## 2019-01-10 MED ORDER — AMLODIPINE BESYLATE 10 MG PO TABS: 10.0000 mg | ORAL_TABLET | Freq: Every day | ORAL | 0 refills | Status: DC

## 2019-01-10 MED ORDER — METOPROLOL SUCCINATE ER 25 MG PO TB24: 25.0000 mg | ORAL_TABLET | Freq: Every day | ORAL | 0 refills | Status: DC

## 2019-01-10 MED FILL — VALACYCLOVIR HCL 500 MG TAB: 500 | 90 days supply | Qty: 90 | Fill #1

## 2019-01-10 MED FILL — METOPROLOL SUCCINATE ER 25: 25 | 90 days supply | Qty: 90 | Fill #0

## 2019-01-10 MED FILL — GABAPENTIN 300 MG CAPSULE: 300 | 30 days supply | Qty: 60 | Fill #2

## 2019-01-10 MED FILL — AMLODIPINE BESYLATE 10 MG T: 10 | 90 days supply | Qty: 90 | Fill #0

## 2019-01-24 ENCOUNTER — Ambulatory Visit (INDEPENDENT_AMBULATORY_CARE_PROVIDER_SITE_OTHER): Payer: 59 | Admitting: Family

## 2019-01-24 ENCOUNTER — Other Ambulatory Visit: Payer: Self-pay

## 2019-01-24 DIAGNOSIS — I10 Essential (primary) hypertension: Secondary | ICD-10-CM

## 2019-01-24 DIAGNOSIS — K219 Gastro-esophageal reflux disease without esophagitis: Secondary | ICD-10-CM | POA: Diagnosis not present

## 2019-01-24 DIAGNOSIS — G4733 Obstructive sleep apnea (adult) (pediatric): Secondary | ICD-10-CM | POA: Diagnosis not present

## 2019-01-24 MED ORDER — OMEPRAZOLE 40 MG PO CPDR: 40.0000 mg | DELAYED_RELEASE_CAPSULE | Freq: Every day | ORAL | 3 refills | Status: DC

## 2019-01-24 NOTE — Progress Notes (Signed)
Virtual Visit via Video Note  I connected with Denise Austin on 01/24/19 at  7:40 AM EDT by a video enabled telemedicine application and verified that I am speaking with the correct person using two identifiers.   I discussed the limitations of evaluation and management by telemedicine and the availability of in person appointments. The patient expressed understanding and agreed to proceed.  History of Present Illness:  GERD- last visit we gave her a trial off of her omeprazole.  Reports that she refilled her medication about 1 month ago due to recurrent symptoms. She is currently using QOD.   OSA-last visit a home sleep study was reordered. She was advised to call [facility] to schedule.    Reports increased daytime somnolence. Reports that her last weight 226 at home.  Not exercising.   Wt Readings from Last 3 Encounters:  07/30/18 235 lb (106.6 kg)  06/25/18 233 lb (105.7 kg)  06/05/18 215 lb (97.5 kg)   HTN- not checking her blood pressure. Continues toprol xl  and and amlodipine.  BP Readings from Last 3 Encounters:  07/30/18 112/72  06/25/18 116/87  01/21/18 120/90   HSV- no breakouts on valtrex.    Observations/Objective: Gen: awake, alert Resp: breathing is even and non-labored Psych: Calm/pleasant affect Neuro:+ facial symmetry. A and O x 3.  Assessment and Plan:  HTN-  Has been stable.  I have asked the patient to check her blood pressure at work and send me her readings via my chart.  HSV-stable on Valtrex suppressive therapy.  Obstructive sleep apnea-she notes that she has been very busy due to the care of her son who is in a bad accident.  He has improved significantly and she has now returned to work.  She plans to schedule her home sleep study.  This is required before we can reorder CPAP for her.  I have replaced the order.  GERD-she was unable to tolerate coming off of omeprazole.  She is back on omeprazole.  Refill has been sent to her pharmacy.   We discussed importance of weight loss and that if she does lose a significant amount of weight that she likely can titrate off of her proton pump inhibitor.    Follow Up Instructions:   She is advised to follow-up in 3 months.  I discussed the assessment and treatment plan with the patient. The patient was provided an opportunity to ask questions and all were answered. The patient agreed with the plan and demonstrated an understanding of the instructions.   The patient was advised to call back or seek an in-person evaluation if the symptoms worsen or if the condition fails to improve as anticipated.  I provided 15 minutes of non-face-to-face time during this encounter.   Nance Pear, NP

## 2019-02-28 ENCOUNTER — Encounter: Payer: Self-pay | Admitting: Family

## 2019-02-28 DIAGNOSIS — G4733 Obstructive sleep apnea (adult) (pediatric): Secondary | ICD-10-CM

## 2019-02-28 MED FILL — OMEPRAZOLE 40 MG CPDR: 40 | 90 days supply | Qty: 90 | Fill #0

## 2019-03-11 ENCOUNTER — Encounter: Payer: Self-pay | Admitting: Family

## 2019-03-16 ENCOUNTER — Other Ambulatory Visit: Payer: Self-pay

## 2019-03-16 ENCOUNTER — Ambulatory Visit: Payer: 59

## 2019-03-16 DIAGNOSIS — G4733 Obstructive sleep apnea (adult) (pediatric): Secondary | ICD-10-CM | POA: Diagnosis not present

## 2019-03-18 DIAGNOSIS — G4733 Obstructive sleep apnea (adult) (pediatric): Secondary | ICD-10-CM | POA: Diagnosis not present

## 2019-03-30 ENCOUNTER — Other Ambulatory Visit (INDEPENDENT_AMBULATORY_CARE_PROVIDER_SITE_OTHER): Payer: Self-pay | Admitting: Physical Medicine and Rehabilitation

## 2019-03-30 MED FILL — GABAPENTIN 300 MG CAPSULE: 300 | 30 days supply | Qty: 60 | Fill #0

## 2019-03-30 NOTE — Telephone Encounter (Signed)
Please advise 

## 2019-04-06 ENCOUNTER — Encounter: Payer: Self-pay | Admitting: Family

## 2019-04-06 DIAGNOSIS — G4733 Obstructive sleep apnea (adult) (pediatric): Secondary | ICD-10-CM

## 2019-04-15 ENCOUNTER — Telehealth: Payer: Self-pay

## 2019-04-15 NOTE — Telephone Encounter (Signed)
ATC pt, line went to voicemail. LMTCB X1.  [Note: pt is established with Debbrah Alar, NP at [facility] [place] [facility]]

## 2019-04-18 NOTE — Telephone Encounter (Signed)
Referral has been placed for sleep consult in the patient's chart - I will contact pt to schedule -pr

## 2019-04-18 NOTE — Telephone Encounter (Signed)
Patrice can we make sure pt is scheduled for Consult appt with a sleep physician.

## 2019-04-21 ENCOUNTER — Other Ambulatory Visit: Payer: Self-pay

## 2019-04-21 ENCOUNTER — Ambulatory Visit: Payer: 59 | Admitting: Internal Medicine

## 2019-04-21 ENCOUNTER — Encounter: Payer: Self-pay | Admitting: Internal Medicine

## 2019-04-21 VITALS — BP 118/70 | HR 82 | Temp 98.0°F | Ht 64.5 in | Wt 242.4 lb

## 2019-04-21 DIAGNOSIS — G4733 Obstructive sleep apnea (adult) (pediatric): Secondary | ICD-10-CM

## 2019-04-21 NOTE — Progress Notes (Signed)
04/21/2019- 89 yoF never smoker for sleep evaluation.referred by Debbrah Alar, NP (PCP) for [facility] 03/16/2019; not currently on CPAP HST 04/14/18- AHI 69.1/ hr, desaturation to 74%, body weight 231 lbs HST 03/16/2019- AHI 77.6/ hr, desaturation to 67%, body weight 235 lbs Medical problem list includes HBP, GERD, hyperglycemia, OSA Epworth score 9 Body weight today 242 lbs Told by friends that she snores very loudly, stops breathing, and she is aware of daytime sleepiness if quiet. Denies problems driving. She had a sleep study before she had been seen by her PCP for the issue, and insurance would not cover. She is here now to establish. 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  Denies ENT surgery, lung and heart disease. Not using sleep aid and very little caffeine.  Prior to Admission medications   Medication Sig Start Date End Date Taking? Authorizing Provider  amLODipine (NORVASC) 10 MG tablet TAKE 1 TABLET BY MOUTH DAILY. 01/10/19  Yes Debbrah Alar, NP  BIOTIN PO Take 1 tablet by mouth daily. Gummy vitamin   Yes [provider]  gabapentin (NEURONTIN) 300 MG capsule TAKE 1 CAPSULE BY MOUTH 2 TIMES DAILY. 03/30/19  Yes Magnus Sinning, MD  ibuprofen (ADVIL,MOTRIN) 800 MG tablet Take 1 tablet (800 mg total) by mouth every 8 (eight) hours as needed. 10/28/18  Yes Aundra Dubin, PA-C  metoprolol succinate (TOPROL-XL) 25 MG 24 hr tablet Take 1 tablet (25 mg total) by mouth daily. 01/10/19  Yes Debbrah Alar, NP  omeprazole (PRILOSEC) 40 MG capsule Take 1 capsule (40 mg total) by mouth daily. 01/24/19  Yes Debbrah Alar, NP  valACYclovir (VALTREX) 500 MG tablet TAKE 1 TABLET BY MOUTH DAILY. 10/07/18  Yes Debbrah Alar, NP   Past Medical History:  Diagnosis Date  . GERD (gastroesophageal reflux disease)   . Hypertension   . OSA (obstructive sleep apnea) 04/15/2018  . PONV (postoperative nausea and vomiting)   . Uterine fibroid    Past Surgical History:   Procedure Laterality Date  . BUNIONECTOMY    . CHOLECYSTECTOMY    . LUMBAR LAMINECTOMY/DECOMPRESSION MICRODISCECTOMY Right 04/22/2017   Procedure: Extraforaminal Microdiscectomy - Lumbar two-Lumbar three - right;  Surgeon: Eustace Moore, MD;  Location: [facility];  Service: Neurosurgery;  Laterality: Right;  . lumbar surgery revision  04/24/2017  . WOUND EXPLORATION N/A 05/22/2017   Procedure: Revision Of Lumbar Wound;  Surgeon: Eustace Moore, MD;  Location: [facility];  Service: Neurosurgery;  Laterality: N/A;  Lumbar wound revision   Family History  Problem Relation Age of Onset  . Hypertension Mother   . Diabetes Mother   . Hypertension Father        smoker  . COPD Father   . Single kidney Sister        removed as a child  . Heart attack Neg Hx   . Hyperlipidemia Neg Hx   . Sudden death Neg Hx    Social History   Socioeconomic History  . Marital status: Single    Spouse name: Not on file  . Number of children: Not on file  . Years of education: Not on file  . Highest education level: Not on file  Occupational History  . Not on file  Social Needs  . Financial resource strain: Not on file  . Food insecurity    Worry: Not on file    Inability: Not on file  . Transportation needs    Medical: Not on file    Non-medical: Not on file  Tobacco Use  . Smoking status: Never Smoker  . Smokeless tobacco: Never Used  Substance and Sexual Activity  . Alcohol use: Yes    Alcohol/week: 0.0 standard drinks    Comment: occasional 1-2 times a month  . Drug use: No  . Sexual activity: Yes    Partners: Male    Birth control/protection: None  Lifestyle  . Physical activity    Days per week: Not on file    Minutes per session: Not on file  . Stress: Not on file  Relationships  . Social Herbalist on phone: Not on file    Gets together: Not on file    Attends religious service: Not on file    Active member of club or organization: Not on file    Attends meetings of clubs or  organizations: Not on file    Relationship status: Not on file  . Intimate partner violence    Fear of current or ex partner: Not on file    Emotionally abused: Not on file    Physically abused: Not on file    Forced sexual activity: Not on file  Other Topics Concern  . 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 HEENT:    headaches, difficulty swallowing, tooth/dental problems, sore throat,       sneezing, itching, ear ache, nasal congestion, post nasal drip, snoring CV:    chest pain, orthopnea, PND, swelling in lower extremities, anasarca,                                  dizziness, palpitations Resp:   shortness of breath with exertion or at rest.                productive cough,   non-productive cough, coughing up of blood.              change in color of mucus.  wheezing.   Skin:    rash or lesions. GI:  +heartburn, indigestion, abdominal pain, nausea, vomiting, diarrhea,                 change in bowel habits, loss of appetite GU: dysuria, change in color of urine, no urgency or frequency.   flank pain. MS:   joint pain, stiffness, decreased range of motion, back pain. Neuro-     nothing unusual Psych:  change in mood or affect.  depression or anxiety.   memory loss.  OBJ- Physical Exam General- Alert, Oriented, Affect-appropriate, Distress- none acute, + obese Skin- rash-none, lesions- none, excoriation- none Lymphadenopathy- none Head- atraumatic            Eyes- Gross vision intact, PERRLA, conjunctivae and secretions clear            Ears- Hearing, canals-normal            Nose- Clear, no-Septal dev, mucus,  polyps, erosion, perforation             Throat- Mallampati III-IV ,  mucosa clear , drainage- none, +teeth Neck- flexible , trachea midline, no stridor , thyroid nl, carotid no bruit Chest - symmetrical excursion , unlabored           Heart/CV- RRR , no murmur , no gallop  , no rub, nl s1 s2                           - JVD- none , edema- none, stasis changes- none, varices- none           Lung- clear to P&A, wheeze- none, cough- none , dullness-none, rub- none           Chest wall-  Abd-  Br/ Gen/ Rectal- Not done, not indicated Extrem- cyanosis- none, clubbing, none, atrophy- none, strength- nl Neuro- grossly intact to observation

## 2019-04-21 NOTE — Assessment & Plan Note (Signed)
Discussed importance of weight. Weight loss would help several of her medical problems.

## 2019-04-21 NOTE — Patient Instructions (Signed)
Order- new DME, new CPAP auto 5-20, mask of choice humidifier, supplies, AirView/ card  Please call as needed

## 2019-04-21 NOTE — Assessment & Plan Note (Signed)
Hx and physical strongly consistent with sleep studies documenting severe OSA. We discussed the medical concerns, treatments, importance of weight and responsibility to drive safely. Plan- autopap with DME.

## 2019-04-25 ENCOUNTER — Ambulatory Visit: Payer: 59 | Admitting: Physical Medicine and Rehabilitation

## 2019-04-25 ENCOUNTER — Ambulatory Visit: Payer: 59 | Admitting: Family Medicine

## 2019-04-26 ENCOUNTER — Encounter: Payer: Self-pay | Admitting: Family Medicine

## 2019-04-26 ENCOUNTER — Ambulatory Visit (INDEPENDENT_AMBULATORY_CARE_PROVIDER_SITE_OTHER): Payer: 59 | Admitting: Family Medicine

## 2019-04-26 ENCOUNTER — Other Ambulatory Visit: Payer: Self-pay

## 2019-04-26 DIAGNOSIS — M25551 Pain in right hip: Secondary | ICD-10-CM

## 2019-04-26 NOTE — Progress Notes (Signed)
Office Visit Note   Patient: Denise Austin           Date of Birth: 22-Dec-1970           MRN: [id number] Visit Date: 04/26/2019 Requested by: Debbrah Alar, NP [address] [place],  [place] [address] PCP: Debbrah Alar, NP  Subjective: Chief Complaint  Patient presents with  . Right Hip - Pain    Requesting cortisone injection    HPI: She is here with recurrent right hip pain.  Symptoms for the past several weeks.  History of gluteus medius tendinopathy and trochanteric bursitis based on MRI scan December 2018.  She has had a couple fluoroscopically guided greater trochanter injections as well as an intra-articular injection last June.  During the immediate anesthetic phase last June she did not have that much relief, but apparently did not help because eventually she was pain-free until just recently.  However, she thinks that her pain is primarily lateral and she is not having any groin pain this time.               ROS: No fevers or chills.  All other systems were reviewed and are negative.  Objective: Vital Signs: There were no vitals taken for this visit.  Physical Exam:  General:  Alert and oriented, in no acute distress. Pulm:  Breathing unlabored. Psy:  Normal mood, congruent affect. Skin: No rash on the skin. Right hip: Good range of motion no significant groin pain with passive flexion and internal rotation.  She is point tender on the posterior lateral greater trochanter.  Slight pain with adduction against resistance but overall good strength.  Imaging: Limited diagnostic ultrasound: She has tendinopathy changes of the gluteus medius at the greater trochanter with bony spurring.  No significant bursitis.  Assessment & Plan: 1.  Recurrent right lateral hip pain, suspect greater trochanter syndrome. -Discussed options with patient and elected to do another greater trochanter injection.  Due to body habitus we will do this under  ultrasound guidance to improve accuracy.  After about a week she will start doing lateral leg raises for strengthening for maintenance.     Procedures: Ultrasound-guided right hip injection: After sterile prep with Betadine, injected 8 cc 1% lidocaine without epinephrine and 40 mg methylprednisolone into the gluteus medius tendon at the greater trochanter attachment.  Tendon was fenestrated.  She had very good pain relief during the immediate anesthetic phase.    PMFS History: Patient Active Problem List   Diagnosis Date Noted  . Morbid obesity due to excess calories ([place]) 04/21/2019  . OSA (obstructive sleep apnea) 04/15/2018  . Herpes simplex viral infection 07/10/2017  . S/P lumbar laminectomy 04/22/2017  . GERD (gastroesophageal reflux disease) 07/25/2016  . Hyperglycemia 07/25/2016  . HTN (hypertension) 12/24/2015  . Preventative health care 10/16/2014   Past Medical History:  Diagnosis Date  . GERD (gastroesophageal reflux disease)   . Hypertension   . OSA (obstructive sleep apnea) 04/15/2018  . PONV (postoperative nausea and vomiting)   . Uterine fibroid     Family History  Problem Relation Age of Onset  . Hypertension Mother   . Diabetes Mother   . Hypertension Father        smoker  . COPD Father   . Single kidney Sister        removed as a child  . Heart attack Neg Hx   . Hyperlipidemia Neg Hx   . Sudden death Neg Hx  Past Surgical History:  Procedure Laterality Date  . BUNIONECTOMY    . CHOLECYSTECTOMY    . LUMBAR LAMINECTOMY/DECOMPRESSION MICRODISCECTOMY Right 04/22/2017   Procedure: Extraforaminal Microdiscectomy - Lumbar two-Lumbar three - right;  Surgeon: Eustace Moore, MD;  Location: [facility];  Service: Neurosurgery;  Laterality: Right;  . lumbar surgery revision  04/24/2017  . WOUND EXPLORATION N/A 05/22/2017   Procedure: Revision Of Lumbar Wound;  Surgeon: Eustace Moore, MD;  Location: [facility];  Service: Neurosurgery;  Laterality: N/A;  Lumbar wound  revision   Social History   Occupational History  . Not on file  Tobacco Use  . Smoking status: Never Smoker  . Smokeless tobacco: Never Used  Substance and Sexual Activity  . Alcohol use: Yes    Alcohol/week: 0.0 standard drinks    Comment: occasional 1-2 times a month  . Drug use: No  . Sexual activity: Yes    Partners: Male    Birth control/protection: None

## 2019-05-06 ENCOUNTER — Other Ambulatory Visit: Payer: Self-pay | Admitting: Family

## 2019-05-06 MED FILL — VALACYCLOVIR HCL 500 MG TAB: 500 | 90 days supply | Qty: 90 | Fill #0

## 2019-05-06 MED FILL — GABAPENTIN 300 MG CAPSULE: 300 | 30 days supply | Qty: 60 | Fill #0

## 2019-05-13 DIAGNOSIS — G4733 Obstructive sleep apnea (adult) (pediatric): Secondary | ICD-10-CM | POA: Diagnosis not present

## 2019-05-13 DIAGNOSIS — R269 Unspecified abnormalities of gait and mobility: Secondary | ICD-10-CM | POA: Diagnosis not present

## 2019-05-23 MED FILL — OMEPRAZOLE 40 MG CPDR: 40 | 30 days supply | Qty: 30 | Fill #1

## 2019-06-03 ENCOUNTER — Other Ambulatory Visit (HOSPITAL_BASED_OUTPATIENT_CLINIC_OR_DEPARTMENT_OTHER): Payer: Self-pay | Admitting: Family

## 2019-06-03 DIAGNOSIS — Z1231 Encounter for screening mammogram for malignant neoplasm of breast: Secondary | ICD-10-CM

## 2019-06-03 DIAGNOSIS — Z01419 Encounter for gynecological examination (general) (routine) without abnormal findings: Secondary | ICD-10-CM | POA: Diagnosis not present

## 2019-06-13 DIAGNOSIS — G4733 Obstructive sleep apnea (adult) (pediatric): Secondary | ICD-10-CM | POA: Diagnosis not present

## 2019-06-13 DIAGNOSIS — R269 Unspecified abnormalities of gait and mobility: Secondary | ICD-10-CM | POA: Diagnosis not present

## 2019-06-17 ENCOUNTER — Ambulatory Visit (HOSPITAL_BASED_OUTPATIENT_CLINIC_OR_DEPARTMENT_OTHER): Payer: 59

## 2019-06-22 ENCOUNTER — Other Ambulatory Visit: Payer: Self-pay | Admitting: Family

## 2019-06-22 MED FILL — OMEPRAZOLE 40 MG CPDR: 40 | 90 days supply | Qty: 90 | Fill #0

## 2019-06-24 ENCOUNTER — Other Ambulatory Visit: Payer: Self-pay

## 2019-06-24 ENCOUNTER — Ambulatory Visit (HOSPITAL_BASED_OUTPATIENT_CLINIC_OR_DEPARTMENT_OTHER)
Admission: RE | Admit: 2019-06-24 | Discharge: 2019-06-24 | Disposition: A | Discharge status: Home / Self Care | Payer: 59 | Source: Ambulatory Visit | Attending: Family | Admitting: Family

## 2019-06-24 DIAGNOSIS — Z1231 Encounter for screening mammogram for malignant neoplasm of breast: Secondary | ICD-10-CM | POA: Diagnosis not present

## 2019-07-01 MED FILL — GABAPENTIN 300 MG CAPSULE: 300 | 30 days supply | Qty: 60 | Fill #0

## 2019-07-05 DIAGNOSIS — Z20828 Contact with and (suspected) exposure to other viral communicable diseases: Secondary | ICD-10-CM | POA: Diagnosis not present

## 2019-07-05 DIAGNOSIS — Z1159 Encounter for screening for other viral diseases: Secondary | ICD-10-CM | POA: Diagnosis not present

## 2019-07-06 ENCOUNTER — Telehealth: Payer: Self-pay

## 2019-07-06 MED ORDER — PREDNISONE 10 MG PO TABS: ORAL_TABLET | ORAL | 0 refills | Status: DC

## 2019-07-06 MED FILL — predniSONE 10 MG TABS: 10 | 12 days supply | Qty: 42 | Fill #0

## 2019-07-06 NOTE — Telephone Encounter (Signed)
Rx sent.  Could do another cortisone injection soon, but we might want to try prolotherapy with dextrose next.

## 2019-07-06 NOTE — Telephone Encounter (Signed)
The patient complains of lateral right hip pain - flared up again 4-5 days ago. Had been doing well after the troch injection on 04/26/19. She is requesting a trial of a sterapred pack (last had in March - worked well), before getting another injection. Also, how soon can she have another injection?

## 2019-07-06 NOTE — Telephone Encounter (Signed)
I advised the patient. She voiced understanding, and will let us know if she fails to improve with the steroid pack.

## 2019-07-06 NOTE — Addendum Note (Signed)
Addended by: Hortencia Pilar on: 07/06/2019 09:04 AM   Modules accepted: Orders

## 2019-07-14 DIAGNOSIS — G4733 Obstructive sleep apnea (adult) (pediatric): Secondary | ICD-10-CM | POA: Diagnosis not present

## 2019-07-14 DIAGNOSIS — R269 Unspecified abnormalities of gait and mobility: Secondary | ICD-10-CM | POA: Diagnosis not present

## 2019-07-18 ENCOUNTER — Ambulatory Visit (INDEPENDENT_AMBULATORY_CARE_PROVIDER_SITE_OTHER): Payer: 59

## 2019-07-18 ENCOUNTER — Ambulatory Visit (INDEPENDENT_AMBULATORY_CARE_PROVIDER_SITE_OTHER): Payer: 59 | Admitting: Radiology

## 2019-07-18 DIAGNOSIS — M25522 Pain in left elbow: Secondary | ICD-10-CM

## 2019-07-18 DIAGNOSIS — M25532 Pain in left wrist: Secondary | ICD-10-CM | POA: Diagnosis not present

## 2019-07-18 NOTE — Progress Notes (Signed)
3 view x-rays of the left wrist and left elbow per Dr. Ninfa Linden

## 2019-08-02 ENCOUNTER — Ambulatory Visit (INDEPENDENT_AMBULATORY_CARE_PROVIDER_SITE_OTHER): Payer: 59

## 2019-08-02 ENCOUNTER — Other Ambulatory Visit: Payer: Self-pay | Admitting: Radiology

## 2019-08-02 DIAGNOSIS — M25532 Pain in left wrist: Secondary | ICD-10-CM

## 2019-08-10 ENCOUNTER — Other Ambulatory Visit (INDEPENDENT_AMBULATORY_CARE_PROVIDER_SITE_OTHER): Payer: Self-pay | Admitting: Physical Medicine and Rehabilitation

## 2019-08-10 MED FILL — GABAPENTIN 300 MG CAPSULE: 300 | 30 days supply | Qty: 60 | Fill #0

## 2019-08-10 MED FILL — VALACYCLOVIR HCL 500 MG TAB: 500 | 90 days supply | Qty: 90 | Fill #1

## 2019-08-10 NOTE — Telephone Encounter (Signed)
Please advise 

## 2019-08-13 DIAGNOSIS — G4733 Obstructive sleep apnea (adult) (pediatric): Secondary | ICD-10-CM | POA: Diagnosis not present

## 2019-08-13 DIAGNOSIS — R269 Unspecified abnormalities of gait and mobility: Secondary | ICD-10-CM | POA: Diagnosis not present

## 2019-08-16 ENCOUNTER — Ambulatory Visit: Payer: 59 | Admitting: Internal Medicine

## 2019-09-13 DIAGNOSIS — G4733 Obstructive sleep apnea (adult) (pediatric): Secondary | ICD-10-CM | POA: Diagnosis not present

## 2019-09-13 DIAGNOSIS — R269 Unspecified abnormalities of gait and mobility: Secondary | ICD-10-CM | POA: Diagnosis not present

## 2019-09-14 MED FILL — OMEPRAZOLE 40 MG CPDR: 40 | 30 days supply | Qty: 30 | Fill #1

## 2019-09-29 MED FILL — GABAPENTIN 300 MG CAPSULE: 300 | 30 days supply | Qty: 60 | Fill #1

## 2019-09-29 MED FILL — AMLODIPINE BESYLATE 10 MG T: 10 | 90 days supply | Qty: 90 | Fill #0

## 2019-09-29 MED FILL — METOPROLOL SUCCINATE ER 25: 25 | 90 days supply | Qty: 90 | Fill #0

## 2019-10-14 ENCOUNTER — Other Ambulatory Visit: Payer: Self-pay | Admitting: Family

## 2019-10-14 MED FILL — OMEPRAZOLE 40 MG CPDR: 40 | 90 days supply | Qty: 90 | Fill #0

## 2019-10-14 NOTE — Telephone Encounter (Signed)
Last OV 01/24/19 Last refill 06/22/19 #30/3 Next OV not scheduled

## 2019-10-18 MED FILL — OMEPRAZOLE 40 MG CPDR: 40 | 90 days supply | Qty: 90 | Fill #0 | Status: TO

## 2019-10-19 ENCOUNTER — Encounter: Payer: Self-pay | Admitting: Family Medicine

## 2019-10-19 ENCOUNTER — Other Ambulatory Visit: Payer: Self-pay

## 2019-10-19 ENCOUNTER — Ambulatory Visit (INDEPENDENT_AMBULATORY_CARE_PROVIDER_SITE_OTHER): Payer: 59 | Admitting: Family Medicine

## 2019-10-19 ENCOUNTER — Ambulatory Visit: Payer: Self-pay

## 2019-10-19 DIAGNOSIS — M25551 Pain in right hip: Secondary | ICD-10-CM | POA: Diagnosis not present

## 2019-10-19 MED ORDER — HYDROCODONE-ACETAMINOPHEN 5-325 MG PO TABS: 1.0000 | ORAL_TABLET | Freq: Four times a day (QID) | ORAL | 0 refills | Status: DC | PRN

## 2019-10-19 MED ORDER — TRAMADOL HCL 50 MG PO TABS: 50.0000 mg | ORAL_TABLET | Freq: Four times a day (QID) | ORAL | 0 refills | Status: DC | PRN

## 2019-10-19 MED FILL — HYDROCODON-APAP 5-325: 5-325 | 5 days supply | Qty: 20 | Fill #0

## 2019-10-19 MED FILL — OMEPRAZOLE 40 MG CPDR: 40 | 30 days supply | Qty: 30 | Fill #0

## 2019-10-19 NOTE — Progress Notes (Signed)
Office Visit Note   Patient: Denise Austin           Date of Birth: [birth year]           MRN: [id number] Visit Date: 10/19/2019 Requested by: Debbrah Alar, NP [address] [place],  [place] [address] PCP: Debbrah Alar, NP  Subjective: Chief Complaint  Patient presents with  . Right Hip - Pain    Pain lateral hip and now in the groin since last week. Would like a cortisone injection.    HPI: She is here with recurrent right hip pain.  Injection 6 months ago helped until about 2 weeks ago.  No injury, pain mostly on the lateral aspect but some pain in the groin area.              ROS: No fevers or chills.  All other systems were reviewed and are negative.  Objective: Vital Signs: LMP 04/02/2018   Physical Exam:  General:  Alert and oriented, in no acute distress. Pulm:  Breathing unlabored. Psy:  Normal mood, congruent affect. Skin: No rash. Right hip: Good range of motion with flexion and internal rotation, and no significant groin pain.  Point tender over the greater trochanter.  Lower extremity strength and reflexes normal, no pain with resisted strength testing.  Imaging: None today.  Assessment & Plan: 1.  Recurrent right hip greater trochanter syndrome -Discussed with patient, elected to inject again under ultrasound guidance.  Follow-up as needed.     Procedures: Ultrasound-guided right greater trochanter injection: Ultrasound was used due to body habitus.  After sterile prep with Betadine, injected 8 cc 1% lidocaine without epinephrine and 40 mg methylprednisolone passing a 22-gauge spinal needle to the gluteus medius tendon at the greater trochanter.  She had very good immediate pain relief.   PMFS History: Patient Active Problem List   Diagnosis Date Noted  . Morbid obesity due to excess calories ([place]) 04/21/2019  . OSA (obstructive sleep apnea) 04/15/2018  . Herpes simplex viral infection 07/10/2017  . S/P lumbar  laminectomy 04/22/2017  . GERD (gastroesophageal reflux disease) 07/25/2016  . Hyperglycemia 07/25/2016  . HTN (hypertension) 12/24/2015  . Preventative health care 10/16/2014  . [place] arthritis 07/06/2014  . S/P excision of ganglion cyst 07/06/2014  . Synovitis 07/06/2014   Past Medical History:  Diagnosis Date  . GERD (gastroesophageal reflux disease)   . Hypertension   . OSA (obstructive sleep apnea) 04/15/2018  . PONV (postoperative nausea and vomiting)   . Uterine fibroid     Family History  Problem Relation Age of Onset  . Hypertension Mother   . Diabetes Mother   . Hypertension Father        smoker  . COPD Father   . Single kidney Sister        removed as a child  . Heart attack Neg Hx   . Hyperlipidemia Neg Hx   . Sudden death Neg Hx     Past Surgical History:  Procedure Laterality Date  . BUNIONECTOMY    . CHOLECYSTECTOMY    . LUMBAR LAMINECTOMY/DECOMPRESSION MICRODISCECTOMY Right 04/22/2017   Procedure: Extraforaminal Microdiscectomy - Lumbar two-Lumbar three - right;  Surgeon: Eustace Moore, MD;  Location: [facility];  Service: Neurosurgery;  Laterality: Right;  . lumbar surgery revision  04/24/2017  . WOUND EXPLORATION N/A 05/22/2017   Procedure: Revision Of Lumbar Wound;  Surgeon: Eustace Moore, MD;  Location: [facility];  Service: Neurosurgery;  Laterality: N/A;  Lumbar wound revision   Social History   Occupational History  . Not on file  Tobacco Use  . Smoking status: Never Smoker  . Smokeless tobacco: Never Used  Substance and Sexual Activity  . Alcohol use: Yes    Alcohol/week: 0.0 standard drinks    Comment: occasional 1-2 times a month  . Drug use: No  . Sexual activity: Yes    Partners: Male    Birth control/protection: None

## 2019-10-19 NOTE — Addendum Note (Signed)
Addended by: Hortencia Pilar on: 10/19/2019 01:56 PM   Modules accepted: Orders

## 2019-11-23 ENCOUNTER — Other Ambulatory Visit: Payer: Self-pay | Admitting: Family

## 2019-11-23 MED FILL — GABAPENTIN 300 MG CAPSULE: 300 | 30 days supply | Qty: 60 | Fill #2

## 2019-11-24 MED FILL — VALACYCLOVIR HCL 500 MG TAB: 500 | 90 days supply | Qty: 90 | Fill #0

## 2019-11-24 MED FILL — OMEPRAZOLE DR 40 MG CAPSULE: 40 | 30 days supply | Qty: 30 | Fill #0

## 2019-12-06 DIAGNOSIS — N924 Excessive bleeding in the premenopausal period: Secondary | ICD-10-CM | POA: Diagnosis not present

## 2019-12-06 DIAGNOSIS — R9389 Abnormal findings on diagnostic imaging of other specified body structures: Secondary | ICD-10-CM | POA: Diagnosis not present

## 2019-12-06 DIAGNOSIS — N83202 Unspecified ovarian cyst, left side: Secondary | ICD-10-CM | POA: Diagnosis not present

## 2019-12-06 DIAGNOSIS — N950 Postmenopausal bleeding: Secondary | ICD-10-CM | POA: Diagnosis not present

## 2019-12-06 DIAGNOSIS — N859 Noninflammatory disorder of uterus, unspecified: Secondary | ICD-10-CM | POA: Diagnosis not present

## 2020-01-02 ENCOUNTER — Telehealth: Payer: Self-pay

## 2020-01-02 ENCOUNTER — Other Ambulatory Visit: Payer: Self-pay | Admitting: Family

## 2020-01-02 MED ORDER — PREDNISONE 10 MG PO TABS: ORAL_TABLET | ORAL | 0 refills | Status: DC

## 2020-01-02 MED FILL — GABAPENTIN 300 MG CAPSULE: 300 | 30 days supply | Qty: 60 | Fill #3

## 2020-01-02 MED FILL — AMLODIPINE BESYLATE 10 MG T: 10 | 90 days supply | Qty: 90 | Fill #0

## 2020-01-02 MED FILL — METOPROLOL SUCCINATE ER 25: 25 | 90 days supply | Qty: 90 | Fill #0

## 2020-01-02 MED FILL — OMEPRAZOLE 40 MG CPDR: 40 | 30 days supply | Qty: 30 | Fill #1

## 2020-01-02 NOTE — Telephone Encounter (Signed)
Rx sent 

## 2020-01-02 NOTE — Addendum Note (Signed)
Addended by: Hortencia Pilar on: 01/02/2020 05:05 PM   Modules accepted: Orders

## 2020-01-02 NOTE — Telephone Encounter (Signed)
Patient asking for prednisone dose pak for her back and leg pain  Denise Austin

## 2020-01-03 MED FILL — predniSONE 10 MG TABS: 10 | 12 days supply | Qty: 42 | Fill #0

## 2020-02-03 MED FILL — OMEPRAZOLE 40 MG CPDR: 40 | 30 days supply | Qty: 30 | Fill #2

## 2020-02-10 ENCOUNTER — Encounter: Payer: 59 | Admitting: Family

## 2020-02-13 MED FILL — GABAPENTIN 300 MG CAPSULE: 300 | 30 days supply | Qty: 60 | Fill #4

## 2020-03-06 ENCOUNTER — Other Ambulatory Visit (INDEPENDENT_AMBULATORY_CARE_PROVIDER_SITE_OTHER): Payer: Self-pay | Admitting: Physical Medicine and Rehabilitation

## 2020-03-06 NOTE — Telephone Encounter (Signed)
Please advise 

## 2020-03-27 ENCOUNTER — Telehealth: Payer: Self-pay | Admitting: Physical Medicine and Rehabilitation

## 2020-03-27 ENCOUNTER — Ambulatory Visit (INDEPENDENT_AMBULATORY_CARE_PROVIDER_SITE_OTHER): Payer: 59 | Admitting: Family Medicine

## 2020-03-27 ENCOUNTER — Other Ambulatory Visit: Payer: Self-pay

## 2020-03-27 ENCOUNTER — Encounter: Payer: Self-pay | Admitting: Family Medicine

## 2020-03-27 ENCOUNTER — Other Ambulatory Visit: Payer: Self-pay | Admitting: Family

## 2020-03-27 DIAGNOSIS — M545 Low back pain, unspecified: Secondary | ICD-10-CM

## 2020-03-27 MED ORDER — METHYLPREDNISOLONE 4 MG PO TBPK: ORAL_TABLET | ORAL | 0 refills | Status: DC

## 2020-03-27 MED ORDER — BACLOFEN 10 MG PO TABS: 5.0000 mg | ORAL_TABLET | Freq: Three times a day (TID) | ORAL | 3 refills | Status: DC | PRN

## 2020-03-27 MED ORDER — TRAMADOL HCL 50 MG PO TABS: 50.0000 mg | ORAL_TABLET | Freq: Four times a day (QID) | ORAL | 0 refills | Status: DC | PRN

## 2020-03-27 MED FILL — GABAPENTIN 300 MG CAPSULE: 300 | 30 days supply | Qty: 60 | Fill #0

## 2020-03-27 MED FILL — BACLOFEN 10 MG TABS: 10 | 10 days supply | Qty: 30 | Fill #0

## 2020-03-27 MED FILL — METHYLPREDNISOLONE 4 MG TBP: 4 | 6 days supply | Qty: 21 | Fill #0

## 2020-03-27 MED FILL — VALACYCLOVIR HCL 500 MG TAB: 500 | 90 days supply | Qty: 90 | Fill #1

## 2020-03-27 MED FILL — traMADol HCL 50 MG TABS: 50 | 7 days supply | Qty: 30 | Fill #0

## 2020-03-27 NOTE — Progress Notes (Signed)
Office Visit Note   Patient: ARLY BLEE           Date of Birth: Jun 18, 1971           MRN: [id number] Visit Date: 03/27/2020 Requested by: Debbrah Alar, NP [place] [address] [place],  [place] [address] PCP: Debbrah Alar, NP  Subjective: Chief Complaint  Patient presents with  . Right Hip - Pain  . Lower Back - Pain    Pain flared up right lower back 03/25/20, radiating around the hip to the right groin and down the lateral/anterior thigh.Woke up with pain. Feels better only with applying heat to the area.     HPI: She is here with low back and right hip pain.  On Saturday she was feeling fine, no pain whatsoever.  Her mother had a back brace and she decided to wear it for a little bit to see if it would keep her from having any troubles.  She has had L5-S1 microdiscectomy in the past.  That evening she started feeling some soreness in her lower back and the next morning she could hardly get out of bed because pain was radiating into the right groin area.  She has been resting over the weekend and taking over-the-counter medicines and does not seem to be getting much better.  No fevers or chills, no rash, no bowel or bladder dysfunction.  No history of kidney stones.              ROS:   All other systems were reviewed and are negative.  Objective: Vital Signs: LMP 04/02/2018   Physical Exam:  General:  Alert and oriented, in no acute distress. Pulm:  Breathing unlabored. Psy:  Normal mood, congruent affect. Skin: No visible rash. Low back: She has a little bit of tenderness to the right of midline at about L2-3.  No significant CVA tenderness.  No pain with internal/external hip rotation and her range of motion is excellent.  She has no tenderness over the greater trochanter.  Lower extremity strength and reflexes are normal.  Imaging: No results found.  Assessment & Plan: 1.  Low back and right hip pain, etiology uncertain but cannot rule out upper  lumbar disc protrusion -We will try Medrol Dosepak, baclofen as needed.  Physical therapy referral.  If symptoms persist, possibly epidural injection versus MRI scan.     Procedures: No procedures performed  No notes on file     PMFS History: Patient Active Problem List   Diagnosis Date Noted  . Morbid obesity due to excess calories ([place]) 04/21/2019  . OSA (obstructive sleep apnea) 04/15/2018  . Herpes simplex viral infection 07/10/2017  . S/P lumbar laminectomy 04/22/2017  . GERD (gastroesophageal reflux disease) 07/25/2016  . Hyperglycemia 07/25/2016  . HTN (hypertension) 12/24/2015  . Preventative health care 10/16/2014  . [place] arthritis 07/06/2014  . S/P excision of ganglion cyst 07/06/2014  . Synovitis 07/06/2014   Past Medical History:  Diagnosis Date  . GERD (gastroesophageal reflux disease)   . Hypertension   . OSA (obstructive sleep apnea) 04/15/2018  . PONV (postoperative nausea and vomiting)   . Uterine fibroid     Family History  Problem Relation Age of Onset  . Hypertension Mother   . Diabetes Mother   . Hypertension Father        smoker  . COPD Father   . Single kidney Sister        removed as a child  .  Heart attack Neg Hx   . Hyperlipidemia Neg Hx   . Sudden death Neg Hx     Past Surgical History:  Procedure Laterality Date  . BUNIONECTOMY    . CHOLECYSTECTOMY    . LUMBAR LAMINECTOMY/DECOMPRESSION MICRODISCECTOMY Right 04/22/2017   Procedure: Extraforaminal Microdiscectomy - Lumbar two-Lumbar three - right;  Surgeon: Eustace Moore, MD;  Location: [facility];  Service: Neurosurgery;  Laterality: Right;  . lumbar surgery revision  04/24/2017  . WOUND EXPLORATION N/A 05/22/2017   Procedure: Revision Of Lumbar Wound;  Surgeon: Eustace Moore, MD;  Location: [facility];  Service: Neurosurgery;  Laterality: N/A;  Lumbar wound revision   Social History   Occupational History  . Not on file  Tobacco Use  . Smoking status: Never Smoker  . Smokeless tobacco:  Never Used  Substance and Sexual Activity  . Alcohol use: Yes    Alcohol/week: 0.0 standard drinks    Comment: occasional 1-2 times a month  . Drug use: No  . Sexual activity: Yes    Partners: Male    Birth control/protection: None

## 2020-03-27 NOTE — Telephone Encounter (Signed)
Patient is requesting an injection for her lower back. She states that she is hurting on the right side and the pain is going around to her right groin. Please advise.

## 2020-03-27 NOTE — Telephone Encounter (Signed)
Had two more recent US guided trochanter injections with Dr. Junius Roads, last being in December. Her symptoms during that visit sound similar. Might be more time efficient to see him for possible repeat of that and if he thinks back injection I can work her in sometime

## 2020-03-27 NOTE — Telephone Encounter (Signed)
Patient will see Dr. Junius Roads first.

## 2020-04-09 ENCOUNTER — Ambulatory Visit (INDEPENDENT_AMBULATORY_CARE_PROVIDER_SITE_OTHER): Payer: 59 | Admitting: Physical Therapy

## 2020-04-09 ENCOUNTER — Encounter: Payer: Self-pay | Admitting: Physical Therapy

## 2020-04-09 ENCOUNTER — Other Ambulatory Visit: Payer: Self-pay

## 2020-04-09 DIAGNOSIS — M6281 Muscle weakness (generalized): Secondary | ICD-10-CM | POA: Diagnosis not present

## 2020-04-09 DIAGNOSIS — M5441 Lumbago with sciatica, right side: Secondary | ICD-10-CM | POA: Diagnosis not present

## 2020-04-09 MED FILL — OMEPRAZOLE 40 MG CPDR: 40 | 90 days supply | Qty: 90 | Fill #0

## 2020-04-09 NOTE — Therapy (Signed)
[facility] Physical Therapy [address] [place], [place], [address] Phone: [phone]   Fax:  [phone]  Physical Therapy Evaluation  Patient Details  Name: Denise Austin MRN: [id number] Date of Birth: 08/05/1971 Referring Provider (PT): Hilts, MD   Encounter Date: 04/09/2020   PT End of Session - 04/09/20 1406    Visit Number 1    Number of Visits 12    Date for PT Re-Evaluation 05/21/20    PT Start Time 1304    PT Stop Time 1345    PT Time Calculation (min) 41 min    Activity Tolerance Patient tolerated treatment well    Behavior During Therapy [facility] for tasks assessed/performed           Past Medical History:  Diagnosis Date  . GERD (gastroesophageal reflux disease)   . Hypertension   . OSA (obstructive sleep apnea) 04/15/2018  . PONV (postoperative nausea and vomiting)   . Uterine fibroid     Past Surgical History:  Procedure Laterality Date  . BUNIONECTOMY    . CHOLECYSTECTOMY    . LUMBAR LAMINECTOMY/DECOMPRESSION MICRODISCECTOMY Right 04/22/2017   Procedure: Extraforaminal Microdiscectomy - Lumbar two-Lumbar three - right;  Surgeon: Eustace Moore, MD;  Location: [facility];  Service: Neurosurgery;  Laterality: Right;  . lumbar surgery revision  04/24/2017  . WOUND EXPLORATION N/A 05/22/2017   Procedure: Revision Of Lumbar Wound;  Surgeon: Eustace Moore, MD;  Location: [facility];  Service: Neurosurgery;  Laterality: N/A;  Lumbar wound revision    There were no vitals filed for this visit.    Subjective Assessment - 04/09/20 1306    Subjective Back Pain with radiculopathy into Rt leg since end of 2017. She has had L5-S1 microdiscectomy and laminectomy in 2018.  Pain flared up right lower back 03/25/20, radiating around the hip to the right groin and down the lateral/anterior thigh.Woke up with pain.    Pertinent History She has had L5-S1 microdiscectomy and laminectomy in 2018    Limitations House hold activities;Standing;Lifting    How long  can you sit comfortably? as long as she needs if her feet can rest/reach the floor    How long can you stand comfortably? not limited    How long can you walk comfortably? not limited    Diagnostic tests no recent imaging    Patient Stated Goals reduce pain, avoid surgery    Currently in Pain? Yes    Pain Score 4     Pain Location Back    Pain Orientation Right    Pain Descriptors / Indicators Aching;Burning    Pain Type Acute pain    Pain Radiating Towards into Rt groin    Pain Onset More than a month ago    Pain Frequency Intermittent    Aggravating Factors  sitting in a high chair or not having her feet supported,    Pain Relieving Factors standing, heat, dose pack              [facility] PT Assessment - 04/09/20 0001      Assessment   Medical Diagnosis low back pain with Rt radiculopathy    Referring Provider (PT) Hilts, MD    Onset Date/Surgical Date --   acute on chronic pain flared up 04/09/20   Next MD Visit PRN    Prior Therapy nothing recent      Precautions   Precautions None      Restrictions   Weight Bearing Restrictions No  Balance Screen   Has the patient fallen in the past 6 months No    Has the patient had a decrease in activity level because of a fear of falling?  No    Is the patient reluctant to leave their home because of a fear of falling?  No      Home Ecologist residence      Prior Function   Vocation Full time employment    Vocation Requirements [employer] employee      Cognition   Overall Cognitive Status Within Functional Limits for tasks assessed      Posture/Postural Control   Posture Comments possible left trunk shift in relation to hips      ROM / Strength   AROM / PROM / Strength AROM;Strength      AROM   Overall AROM Comments lumar ROM WNL    AROM Assessment Site Lumbar      Strength   Overall Strength Comments overall hip strength 4+/5, knee strenght and ankle DF strength 5/5 MMT. Needs  core strength      Palpation   Palpation comment TTP glutes,piriformis, denies any TTP or radicular provocation with spinal mobs central PA mobs      Special Tests   Other special tests negative slump test, negative SLR on Lt that was mildly positive on Rt.       Transfers   Transfers Independent with all Transfers      Ambulation/Gait   Gait Comments WFL                      Objective measurements completed on examination: See above findings.       [facility] Adult PT Treatment/Exercise - 04/09/20 0001      Exercises   Exercises Lumbar      Lumbar Exercises: Stretches   Other Lumbar Stretch Exercise Lt arm on wall for lateral shift correction X 10 reps, some mild improvment. Trialed repeated extension in standing and supine but no change.      Modalities   Modalities Electrical Stimulation;Moist Heat      Moist Heat Therapy   Number Minutes Moist Heat 15 Minutes    Moist Heat Location Lumbar Spine      Electrical Stimulation   Electrical Stimulation Location lumbar    Electrical Stimulation Action IFC in sidelying 15 min with heat    Electrical Stimulation Parameters tolerance    Electrical Stimulation Goals Pain      Manual Therapy   Manual therapy comments long axis distraction to Rt LE in IR and ER, prone STM/T.P release to glutes                  PT Education - 04/09/20 1406    Education Details HEP, POC, exam findings    Person(s) Educated Patient    Methods Explanation;Demonstration;Verbal cues;Handout    Comprehension Verbalized understanding;Need further instruction               PT Long Term Goals - 04/09/20 1413      PT LONG TERM GOAL #1   Title Pt will be compliant with HEP.    Time 6    Period Weeks    Status New    Target Date 05/21/20      PT LONG TERM GOAL #2   Title Pt will report no more than 2/10 pain with usual activity and sitting work.    Time 6  Period Weeks    Status New      PT LONG TERM GOAL #3    Title Pt will report no radicular symptoms going down her Rt leg.    Time 6    Period Weeks    Status New                  Plan - 04/09/20 1408    Clinical Impression Statement Pt presents with Rt sided low back pain with intermittent radiculopathy down to her Rt groin today. This presents more as disc pathology today with special testing and her presentation. She also has some tightness and triggerpoints in her glutes. She will benefit from skilled PT to address her functional deficits.    Personal Factors and Comorbidities Comorbidity 2    Comorbidities HTN, She has had L5-S1 microdiscectomy and laminectomy in 2018.    Examination-Activity Limitations Sleep;Squat;Stand;Lift    Examination-Participation Restrictions Cleaning;Driving    Stability/Clinical Decision Making Evolving/Moderate complexity    Clinical Decision Making Moderate    Rehab Potential Good    PT Frequency 2x / week   1-2   PT Duration 6 weeks    PT Treatment/Interventions Cryotherapy;Electrical Stimulation;Iontophoresis 4mg /ml Dexamethasone;Moist Heat;Traction;Ultrasound;Gait training;Functional mobility training;Therapeutic activities;Therapeutic exercise;Neuromuscular re-education;Manual techniques;Passive range of motion;Dry needling;Joint Manipulations;Spinal Manipulations;Taping    PT Next Visit Plan reveiw and update HEP PRN, how are radicular symptoms, consider traction or manual PRN    PT Home Exercise Plan Access Code: [email or web address]    Consulted and Agree with Plan of Care Patient           Patient will benefit from skilled therapeutic intervention in order to improve the following deficits and impairments:  Decreased activity tolerance, Decreased endurance, Decreased range of motion, Decreased strength, Increased fascial restricitons, Increased muscle spasms, Impaired flexibility, Postural dysfunction, Pain  Visit Diagnosis: Acute right-sided low back pain with right-sided sciatica  Muscle weakness  (generalized)     Problem List Patient Active Problem List   Diagnosis Date Noted  . Morbid obesity due to excess calories ([place]) 04/21/2019  . OSA (obstructive sleep apnea) 04/15/2018  . Herpes simplex viral infection 07/10/2017  . S/P lumbar laminectomy 04/22/2017  . GERD (gastroesophageal reflux disease) 07/25/2016  . Hyperglycemia 07/25/2016  . HTN (hypertension) 12/24/2015  . Preventative health care 10/16/2014  . [place] arthritis 07/06/2014  . S/P excision of ganglion cyst 07/06/2014  . Synovitis 07/06/2014    Debbe Odea, PT,DPT 04/09/2020, 2:28 PM  [facility] Physical Therapy [address] [place], [place], [address] Phone: [phone]   Fax:  [phone]  Name: Denise Austin MRN: [id number] Date of Birth: [birth year]

## 2020-04-09 NOTE — Patient Instructions (Signed)
Access Code: [email or web address]: [email or web address]: 06/14/2021Prepared by: Aaron Edelman NelsonExercises  Lateral Shift Correction at [place] - 3 x daily - 6 x weekly - 2-3 sets - 10 reps  Seated Pelvic Tilts - 3 x daily - 6 x weekly - 1-2 sets - 10 reps  Supine Piriformis Stretch with Leg Straight - 2 x daily - 6 x weekly - 10 reps - 3 sets  Supine Bridge - 2 x daily - 6 x weekly - 10 reps - 1-2 sets - 5 hold  Hooklying Isometric Clamshell - 2 x daily - 6 x weekly - 1 sets - 10 reps - 5 hold  Cat-Camel - 2 x daily - 6 x weekly - 10 reps - 1 sets - 5 hold

## 2020-04-12 ENCOUNTER — Encounter: Payer: 59 | Admitting: Physical Therapy

## 2020-04-18 ENCOUNTER — Encounter: Payer: Self-pay | Admitting: Physical Therapy

## 2020-04-18 ENCOUNTER — Ambulatory Visit (INDEPENDENT_AMBULATORY_CARE_PROVIDER_SITE_OTHER): Payer: 59 | Admitting: Physical Therapy

## 2020-04-18 ENCOUNTER — Other Ambulatory Visit: Payer: Self-pay

## 2020-04-18 DIAGNOSIS — M5441 Lumbago with sciatica, right side: Secondary | ICD-10-CM

## 2020-04-18 DIAGNOSIS — M6281 Muscle weakness (generalized): Secondary | ICD-10-CM | POA: Diagnosis not present

## 2020-04-18 NOTE — Therapy (Signed)
[place], [place], [address] Phone: [phone]   Fax:  [phone]  Physical Therapy Treatment  Patient Details  Name: Denise Austin MRN: [id number] Date of Birth: 03-Mar-1971 Referring Provider (PT): Hilts, MD   Encounter Date: 04/18/2020   PT End of Session - 04/18/20 0841    Visit Number 2    Number of Visits 12    Date for PT Re-Evaluation 05/21/20    PT Start Time 0805    PT Stop Time 0850    PT Time Calculation (min) 45 min    Activity Tolerance Patient tolerated treatment well    Behavior During Therapy [facility] for tasks assessed/performed           Past Medical History:  Diagnosis Date  . GERD (gastroesophageal reflux disease)   . Hypertension   . OSA (obstructive sleep apnea) 04/15/2018  . PONV (postoperative nausea and vomiting)   . Uterine fibroid     Past Surgical History:  Procedure Laterality Date  . BUNIONECTOMY    . CHOLECYSTECTOMY    . LUMBAR LAMINECTOMY/DECOMPRESSION MICRODISCECTOMY Right 04/22/2017   Procedure: Extraforaminal Microdiscectomy - Lumbar two-Lumbar three - right;  Surgeon: Eustace Moore, MD;  Location: [facility];  Service: Neurosurgery;  Laterality: Right;  . lumbar surgery revision  04/24/2017  . WOUND EXPLORATION N/A 05/22/2017   Procedure: Revision Of Lumbar Wound;  Surgeon: Eustace Moore, MD;  Location: [facility];  Service: Neurosurgery;  Laterality: N/A;  Lumbar wound revision    There were no vitals filed for this visit.   Subjective Assessment - 04/18/20 0807    Subjective Relays her back is improving some, still with randem radiculopathy maybe if she lays on that side, thanks the exercises are helping    Pertinent History She has had L5-S1 microdiscectomy and laminectomy in 2018    Limitations House hold activities;Standing;Lifting    How long can you sit comfortably? as long as she needs if her feet can rest/reach the floor    How long can you stand comfortably? not  limited    How long can you walk comfortably? not limited    Diagnostic tests no recent imaging    Patient Stated Goals reduce pain, avoid surgery    Pain Onset More than a month ago              [facility] Adult PT Treatment/Exercise - 04/18/20 0001      Lumbar Exercises: Stretches   Piriformis Stretch Right;4 reps;30 seconds    Piriformis Stretch Limitations 2 across, 2 down      Lumbar Exercises: Aerobic   Nustep 5 min L5 UE/LE      Lumbar Exercises: Supine   Clam 20 reps    Clam Limitations blue    Bridge 15 reps;5 seconds    Straight Leg Raise 15 reps      Lumbar Exercises: Quadruped   Opposite Arm/Leg Raise Right arm/Left leg;Left arm/Right leg;10 reps    Opposite Arm/Leg Raise Limitations verbal cues and demo for technique and neutral pelvis      Modalities   Modalities Traction      Traction   Type of Traction Lumbar    Min (lbs) 70    Max (lbs) 80    Time 15 min                  PT Education - 04/18/20 0840    Education Details mechanical traction rationale    Person(s) Educated  Patient    Methods Explanation    Comprehension Verbalized understanding               PT Long Term Goals - 04/09/20 1413      PT LONG TERM GOAL #1   Title Pt will be compliant with HEP.    Time 6    Period Weeks    Status New    Target Date 05/21/20      PT LONG TERM GOAL #2   Title Pt will report no more than 2/10 pain with usual activity and sitting work.    Time 6    Period Weeks    Status New      PT LONG TERM GOAL #3   Title Pt will report no radicular symptoms going down her Rt leg.    Time 6    Period Weeks    Status New                 Plan - 04/18/20 5170    Clinical Impression Statement Overall less pain and radiculopathy since her PT evaluation. She was able to progress her core strengthening/spinal stabilization program today with good tolerance and without complaints. Trialed her on mechanical traction today for spinal decompression  in attemp to reduce radiculopathy further.    Personal Factors and Comorbidities Comorbidity 2    Comorbidities HTN, She has had L5-S1 microdiscectomy and laminectomy in 2018.    Examination-Activity Limitations Sleep;Squat;Stand;Lift    Examination-Participation Restrictions Cleaning;Driving    Stability/Clinical Decision Making Evolving/Moderate complexity    Rehab Potential Good    PT Frequency 2x / week   1-2   PT Duration 6 weeks    PT Treatment/Interventions Cryotherapy;Electrical Stimulation;Iontophoresis 4mg /ml Dexamethasone;Moist Heat;Traction;Ultrasound;Gait training;Functional mobility training;Therapeutic activities;Therapeutic exercise;Neuromuscular re-education;Manual techniques;Passive range of motion;Dry needling;Joint Manipulations;Spinal Manipulations;Taping    PT Next Visit Plan how was traction? progress core as able    PT Home Exercise Plan Access Code: [email or web address]    Consulted and Agree with Plan of Care Patient           Patient will benefit from skilled therapeutic intervention in order to improve the following deficits and impairments:  Decreased activity tolerance, Decreased endurance, Decreased range of motion, Decreased strength, Increased fascial restricitons, Increased muscle spasms, Impaired flexibility, Postural dysfunction, Pain  Visit Diagnosis: Acute right-sided low back pain with right-sided sciatica  Muscle weakness (generalized)     Problem List Patient Active Problem List   Diagnosis Date Noted  . Morbid obesity due to excess calories ([place]) 04/21/2019  . OSA (obstructive sleep apnea) 04/15/2018  . Herpes simplex viral infection 07/10/2017  . S/P lumbar laminectomy 04/22/2017  . GERD (gastroesophageal reflux disease) 07/25/2016  . Hyperglycemia 07/25/2016  . HTN (hypertension) 12/24/2015  . Preventative health care 10/16/2014  . [place] arthritis 07/06/2014  . S/P excision of ganglion cyst 07/06/2014  . Synovitis 07/06/2014    Silvestre Mesi 04/18/2020, 8:48 AM  [facility] Physical Therapy [address] [place], [place], [address] Phone: [phone]   Fax:  [phone]  Name: Denise Austin MRN: [id number] Date of Birth: July 05, 1971

## 2020-04-26 ENCOUNTER — Encounter: Payer: 59 | Admitting: Physical Therapy

## 2020-05-01 MED FILL — GABAPENTIN 300 MG CAPSULE: 300 | 30 days supply | Qty: 60 | Fill #0

## 2020-05-02 ENCOUNTER — Encounter: Payer: 59 | Admitting: Physical Therapy

## 2020-05-04 ENCOUNTER — Encounter: Payer: 59 | Admitting: Physical Therapy

## 2020-05-08 ENCOUNTER — Other Ambulatory Visit: Payer: Self-pay

## 2020-05-08 ENCOUNTER — Ambulatory Visit (INDEPENDENT_AMBULATORY_CARE_PROVIDER_SITE_OTHER): Payer: 59 | Admitting: Physical Therapy

## 2020-05-08 DIAGNOSIS — M6281 Muscle weakness (generalized): Secondary | ICD-10-CM

## 2020-05-08 DIAGNOSIS — M5441 Lumbago with sciatica, right side: Secondary | ICD-10-CM | POA: Diagnosis not present

## 2020-05-08 NOTE — Therapy (Signed)
[facility] Physical Therapy [address] [place], [place], [address] Phone: [phone]   Fax:  [phone]  Physical Therapy Treatment/[place]  PHYSICAL THERAPY DISCHARGE SUMMARY  Visits from Start of Care: 3  Current functional level related to goals / functional outcomes: Back to baseline   Remaining deficits: none   Education / Equipment: HEP Plan: Patient agrees to discharge.  Patient goals were partially met. Patient is being discharged due to meeting the stated rehab goals.  ?????      Patient Details  Name: Denise Austin MRN: [id number] Date of Birth: [birth year] Referring Provider (PT): Hilts, MD   Encounter Date: 05/08/2020   PT End of Session - 05/08/20 1342    Visit Number 3    Number of Visits 12    Date for PT Re-Evaluation 05/21/20    PT Start Time 1300    PT Stop Time 2458    PT Time Calculation (min) 38 min    Activity Tolerance Patient tolerated treatment well    Behavior During Therapy [facility] for tasks assessed/performed           Past Medical History:  Diagnosis Date   GERD (gastroesophageal reflux disease)    Hypertension    OSA (obstructive sleep apnea) 04/15/2018   PONV (postoperative nausea and vomiting)    Uterine fibroid     Past Surgical History:  Procedure Laterality Date   BUNIONECTOMY     CHOLECYSTECTOMY     LUMBAR LAMINECTOMY/DECOMPRESSION MICRODISCECTOMY Right 04/22/2017   Procedure: Extraforaminal Microdiscectomy - Lumbar two-Lumbar three - right;  Surgeon: Eustace Moore, MD;  Location: [place];  Service: Neurosurgery;  Laterality: Right;   lumbar surgery revision  04/24/2017   WOUND EXPLORATION N/A 05/22/2017   Procedure: Revision Of Lumbar Wound;  Surgeon: Eustace Moore, MD;  Location: [place];  Service: Neurosurgery;  Laterality: N/A;  Lumbar wound revision    There were no vitals filed for this visit.   Subjective Assessment - 05/08/20 1341    Subjective relays the exercises are  really helping, she is not longer having radiculopathy down past her groin area. Feels ready to discharge from PT and does not have pain today and no more than 2/10 pain in last week that only lasts a couple of minutes    Pertinent History She has had L5-S1 microdiscectomy and laminectomy in 2018    Limitations House hold activities;Standing;Lifting    How long can you sit comfortably? as long as she needs if her feet can rest/reach the floor    How long can you stand comfortably? not limited    How long can you walk comfortably? not limited    Diagnostic tests no recent imaging    Patient Stated Goals reduce pain, avoid surgery    Pain Onset More than a month ago              [facility] PT Assessment - 05/08/20 0001      Assessment   Medical Diagnosis low back pain with Rt radiculopathy    Referring Provider (PT) Hilts, MD      AROM   Overall AROM Comments lumar ROM WNL      Strength   Overall Strength Comments hip and knee strength WNL bilat             [facility] Adult PT Treatment/Exercise - 05/08/20 0001      Lumbar Exercises: Stretches   Single Knee to Chest Stretch Right;Left;2 reps;30 seconds    Piriformis Stretch Right;Left;2  reps;30 seconds    Other Lumbar Stretch Exercise child pose 30 sec X 3 reps      Lumbar Exercises: Aerobic   Recumbent Bike L3 X 7 min      Lumbar Exercises: Supine   Pelvic Tilt 20 reps    Bridge 20 reps      Lumbar Exercises: Quadruped   [place] Horse 10 reps    Opposite Arm/Leg Raise Right arm/Left leg;Left arm/Right leg;10 reps    Opposite Arm/Leg Raise Limitations verbal cues and demo for technique and neutral pelvis                  PT Education - 05/08/20 1342    Education Details HEP revision    Person(s) Educated Patient    Methods Explanation;Demonstration;Verbal cues;Handout    Comprehension Verbalized understanding;Returned demonstration               PT Long Term Goals - 05/08/20 1345      PT LONG TERM  GOAL #1   Title Pt will be compliant with HEP.    Time 6    Period Weeks    Status Achieved      PT LONG TERM GOAL #2   Title Pt will report no more than 2/10 pain with usual activity and sitting work.    Time 6    Period Weeks    Status Achieved      PT LONG TERM GOAL #3   Title Pt will report no radicular symptoms going down her Rt leg.    Time 6    Period Weeks    Status Achieved                 Plan - 05/08/20 1343    Clinical Impression Statement She has done very well with PT and is only having occaional mild pain that she can manage with her HEP. She has met all PT goals and will be discharged. She had no further questions or concerns.    Personal Factors and Comorbidities Comorbidity 2    Comorbidities HTN, She has had L5-S1 microdiscectomy and laminectomy in 2018.    Examination-Activity Limitations Sleep;Squat;Stand;Lift    Examination-Participation Restrictions Cleaning;Driving    Stability/Clinical Decision Making Evolving/Moderate complexity    Rehab Potential Good    PT Frequency 2x / week   1-2   PT Duration 6 weeks    PT Treatment/Interventions Cryotherapy;Electrical Stimulation;Iontophoresis '4mg'$ /ml Dexamethasone;Moist Heat;Traction;Ultrasound;Gait training;Functional mobility training;Therapeutic activities;Therapeutic exercise;Neuromuscular re-education;Manual techniques;Passive range of motion;Dry needling;Joint Manipulations;Spinal Manipulations;Taping    PT Next Visit Plan discharge today    PT Home Exercise Plan Access Code: [email or web address]    Consulted and Agree with Plan of Care Patient           Patient will benefit from skilled therapeutic intervention in order to improve the following deficits and impairments:  Decreased activity tolerance, Decreased endurance, Decreased range of motion, Decreased strength, Increased fascial restricitons, Increased muscle spasms, Impaired flexibility, Postural dysfunction, Pain  Visit Diagnosis: Acute right-sided  low back pain with right-sided sciatica  Muscle weakness (generalized)     Problem List Patient Active Problem List   Diagnosis Date Noted   Morbid obesity due to excess calories ([place]) 04/21/2019   OSA (obstructive sleep apnea) 04/15/2018   Herpes simplex viral infection 07/10/2017   S/P lumbar laminectomy 04/22/2017   GERD (gastroesophageal reflux disease) 07/25/2016   Hyperglycemia 07/25/2016   HTN (hypertension) 12/24/2015   Preventative health care 10/16/2014   [place] arthritis 07/06/2014  S/P excision of ganglion cyst 07/06/2014   Synovitis 07/06/2014    Silvestre Mesi 05/08/2020, 1:45 PM  [facility] Physical Therapy [address] [place], [place], [address] Phone: [phone]   Fax:  [phone]  Name: Denise Austin MRN: [id number] Date of Birth: [birth year]

## 2020-05-11 ENCOUNTER — Encounter: Payer: 59 | Admitting: Physical Therapy

## 2020-05-14 MED FILL — AMLODIPINE BESYLATE 10 MG T: 10 | 90 days supply | Qty: 90 | Fill #1

## 2020-05-14 MED FILL — METOPROLOL SUCCINATE ER 25: 25 | 90 days supply | Qty: 90 | Fill #1

## 2020-05-15 ENCOUNTER — Encounter: Payer: 59 | Admitting: Physical Therapy

## 2020-05-16 ENCOUNTER — Encounter: Payer: 59 | Admitting: Physical Therapy

## 2020-05-30 ENCOUNTER — Other Ambulatory Visit (HOSPITAL_BASED_OUTPATIENT_CLINIC_OR_DEPARTMENT_OTHER): Payer: Self-pay | Admitting: Family

## 2020-05-30 DIAGNOSIS — Z1231 Encounter for screening mammogram for malignant neoplasm of breast: Secondary | ICD-10-CM

## 2020-06-06 MED FILL — GABAPENTIN 300 MG CAPSULE: 300 | 30 days supply | Qty: 60 | Fill #0

## 2020-06-07 MED FILL — AMLODIPINE BESYLATE 10 MG T: 10 | 90 days supply | Qty: 90 | Fill #1

## 2020-06-07 MED FILL — METOPROLOL SUCCINATE ER 25: 25 | 90 days supply | Qty: 90 | Fill #1

## 2020-06-25 ENCOUNTER — Encounter: Payer: Self-pay | Admitting: Family

## 2020-06-25 ENCOUNTER — Other Ambulatory Visit (HOSPITAL_COMMUNITY)
Admission: RE | Admit: 2020-06-25 | Discharge: 2020-06-25 | Disposition: A | Discharge status: Home / Self Care | Payer: 59 | Source: Ambulatory Visit | Attending: Family | Admitting: Family

## 2020-06-25 ENCOUNTER — Other Ambulatory Visit: Payer: Self-pay

## 2020-06-25 ENCOUNTER — Ambulatory Visit (INDEPENDENT_AMBULATORY_CARE_PROVIDER_SITE_OTHER): Payer: 59 | Admitting: Family

## 2020-06-25 VITALS — BP 118/89 | HR 73 | Temp 98.2°F | Resp 16 | Ht 65.0 in | Wt 230.0 lb

## 2020-06-25 DIAGNOSIS — G4733 Obstructive sleep apnea (adult) (pediatric): Secondary | ICD-10-CM | POA: Diagnosis not present

## 2020-06-25 DIAGNOSIS — E010 Iodine-deficiency related diffuse (endemic) goiter: Secondary | ICD-10-CM

## 2020-06-25 DIAGNOSIS — I10 Essential (primary) hypertension: Secondary | ICD-10-CM | POA: Diagnosis not present

## 2020-06-25 DIAGNOSIS — Z1159 Encounter for screening for other viral diseases: Secondary | ICD-10-CM

## 2020-06-25 DIAGNOSIS — Z01411 Encounter for gynecological examination (general) (routine) with abnormal findings: Secondary | ICD-10-CM

## 2020-06-25 DIAGNOSIS — Z0000 Encounter for general adult medical examination without abnormal findings: Secondary | ICD-10-CM

## 2020-06-25 DIAGNOSIS — Z01419 Encounter for gynecological examination (general) (routine) without abnormal findings: Secondary | ICD-10-CM

## 2020-06-25 LAB — TSH: TSH: 1.17 mIU/L

## 2020-06-25 NOTE — Progress Notes (Signed)
Subjective:    Patient ID: Denise Austin, female    DOB: 1970-12-27, 49 y.o.   MRN: [id number]  HPI  Patient presents today for complete physical.  Immunizations:  Has not had covid vaccine Wt Readings from Last 3 Encounters:  06/25/20 230 lb (104.3 kg)  04/21/19 242 lb 6.4 oz (110 kg)  07/30/18 235 lb (106.6 kg)    Diet: reports typical diet as below:  Breakfast  Breakfast/scrambled eggs and sausage patties or a bowl of cereal  Morning snack:   Yogurt/fruit at work  [employer] I.e[employer] (chipotle, bojangles) Some lemonade, mostly water  Dinner-  Squash/zucchini/onions/pepppers, fried chicken or pork chops/rice   Does not snack in the evenings  Exercise: reports that she is not finding time to exercise regularly Colonoscopy: due in October Pap Smear: 7/18 Mammogram: scheduled  GERD- using omeprazole QOD  OSA- using CPAP- sees Dr. Annamaria Boots.  HTN- maintained on toprol xl and amlodipine.   BP Readings from Last 3 Encounters:  06/25/20 118/89  04/21/19 118/70  07/30/18 112/72       Review of Systems  Constitutional: Negative for unexpected weight change.  HENT: Negative for hearing loss and rhinorrhea.   Eyes: Negative for visual disturbance.  Respiratory: Negative for cough and shortness of breath.   Cardiovascular: Negative for chest pain.  Gastrointestinal: Negative for constipation and diarrhea.  Genitourinary: Positive for menstrual problem (irregular menstrual ). Negative for dysuria and frequency.  Musculoskeletal: Negative for arthralgias and myalgias.  Skin: Negative for rash.  Neurological: Negative for headaches.  Hematological: Negative for adenopathy.  Psychiatric/Behavioral:       Denies depression/anxiety   Past Medical History:  Diagnosis Date  . GERD (gastroesophageal reflux disease)   . Hypertension   . OSA (obstructive sleep apnea) 04/15/2018  . PONV (postoperative nausea and vomiting)   . Uterine fibroid      Social  History   Socioeconomic History  . Marital status: Single    Spouse name: Not on file  . Number of children: Not on file  . Years of education: Not on file  . Highest education level: Not on file  Occupational History  . Not on file  Tobacco Use  . Smoking status: Never Smoker  . Smokeless tobacco: Never Used  Vaping Use  . Vaping Use: Never used  Substance and Sexual Activity  . Alcohol use: Yes    Alcohol/week: 0.0 standard drinks    Comment: occasional 1-2 times a month  . Drug use: No  . Sexual activity: Yes    Partners: Male    Birth control/protection: None  Other Topics Concern  . 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 Difficulty of Paying Living Expenses: Not on file  Food Insecurity:   . Worried About Charity fundraiser in the Last Year: Not on file  . Ran Out of Food in the Last Year: Not on file  Transportation Needs:   . Lack of Transportation (Medical): Not on file  . Lack of Transportation (Non-Medical): Not on file  Physical Activity:   . Days of  Exercise per Week: Not on file  . Minutes of Exercise per Session: Not on file  Stress:   . Feeling of Stress : Not on file  Social Connections:   . Frequency of Communication with Friends and Family: Not on file  . Frequency of Social Gatherings with Friends and Family: Not on file  . Attends Religious Services: Not on file  . Active Member of Clubs or Organizations: Not on file  . Attends Archivist Meetings: Not on file  . Marital Status: Not on file  Intimate Partner Violence:   . Fear of Current or Ex-Partner: Not on file  . Emotionally Abused: Not on file  . Physically  Abused: Not on file  . Sexually Abused: Not on file    Past Surgical History:  Procedure Laterality Date  . BUNIONECTOMY    . CHOLECYSTECTOMY    . LUMBAR LAMINECTOMY/DECOMPRESSION MICRODISCECTOMY Right 04/22/2017   Procedure: Extraforaminal Microdiscectomy - Lumbar two-Lumbar three - right;  Surgeon: Eustace Moore, MD;  Location: [place];  Service: Neurosurgery;  Laterality: Right;  . lumbar surgery revision  04/24/2017  . WOUND EXPLORATION N/A 05/22/2017   Procedure: Revision Of Lumbar Wound;  Surgeon: Eustace Moore, MD;  Location: [place];  Service: Neurosurgery;  Laterality: N/A;  Lumbar wound revision    Family History  Problem Relation Age of Onset  . Hypertension Mother   . Diabetes Mother   . Hypertension Father        smoker  . COPD Father   . Single kidney Sister        removed as a child  . Heart attack Neg Hx   . Hyperlipidemia Neg Hx   . Sudden death Neg Hx     Allergies  Allergen Reactions  . Oxycodone-Acetaminophen Nausea And Vomiting and Rash    PT STATES SHE CAN TOLERATE    Current Outpatient Medications on File Prior to Visit  Medication Sig Dispense Refill  . amLODipine (NORVASC) 10 MG tablet TAKE 1 TABLET BY MOUTH DAILY. 90 tablet 1  . baclofen (LIORESAL) 10 MG tablet Take 0.5-1 tablets (5-10 mg total) by mouth 3 (three) times daily as needed for muscle spasms. 30 each 3  . BIOTIN PO Take 1 tablet by mouth daily. Gummy vitamin    . gabapentin (NEURONTIN) 300 MG capsule TAKE 1 CAPSULE BY MOUTH 2 TIMES DAILY. 60 capsule 4  . metoprolol succinate (TOPROL-XL) 25 MG 24 hr tablet TAKE 1 TABLET BY MOUTH DAILY. 90 tablet 1  . omeprazole (PRILOSEC) 40 MG capsule TAKE 1 CAPSULE BY MOUTH ONCE DAILY 90 capsule 1  . traMADol (ULTRAM) 50 MG tablet Take 1 tablet (50 mg total) by mouth every 6 (six) hours as needed. 30 tablet 0  . valACYclovir (VALTREX) 500 MG tablet TAKE 1 TABLET BY MOUTH DAILY. 90 tablet 1   No current facility-administered medications on file prior to  visit.    BP 118/89 (BP Location: Right Arm, Patient Position: Sitting, Cuff Size: Large)   Pulse 73   Temp 98.2 F (36.8 C) (Oral)   Resp 16   Ht 5\' 5"  (1.651 m)   Wt 230 lb (104.3 kg)   LMP 04/02/2018   SpO2 99%   BMI 38.27 kg/m       Objective:   Physical Exam  Physical Exam  Constitutional: She is oriented to person, place, and time. She appears well-developed and well-nourished. No distress.  HENT:  Head: Normocephalic and atraumatic.  Right Ear: Tympanic  membrane and ear canal normal.  Left Ear: Tympanic membrane and ear canal normal.  Mouth/Throat: Oropharynx is clear and moist.  Eyes: Pupils are equal, round, and reactive to light. No scleral icterus.  Neck: Normal range of motion. Enlargement noted of right thyroid lobe present.  Cardiovascular: Normal rate and regular rhythm.   No murmur heard. Pulmonary/Chest: Effort normal and breath sounds normal. No respiratory distress. He has no wheezes. She has no rales. She exhibits no tenderness.  Abdominal: Soft. Bowel sounds are normal. She exhibits no distension and no mass. There is no tenderness. There is no rebound and no guarding.  Musculoskeletal: She exhibits no edema.  Lymphadenopathy:    She has no cervical adenopathy.  Neurological: She is alert and oriented to person, place, and time. She has normal patellar reflexes. She exhibits normal muscle tone. Coordination normal.  Skin: Skin is warm and dry.  Psychiatric: She has a normal mood and affect. Her behavior is normal. Judgment and thought content normal.  Breasts: Examined lying Right: Without masses, retractions, discharge or axillary adenopathy.  Left: Without masses, retractions, discharge or axillary adenopathy.  Inguinal/mons: Normal without inguinal adenopathy  External genitalia: Normal  BUS/Urethra/Skene's glands: Normal  Bladder: Normal  Vagina: Normal  Cervix: Normal  Uterus: normal in size, shape and contour. Midline and mobile    Adnexa/parametria:  Rt: some firmness noted right adnexa  Lt: Without masses or tenderness.  Anus and perineum: Normal            Assessment & Plan:   Preventative care- discussed healthy diet, exercise and weight loss.  Discussed switching sausage patties to [place] or chicken sausage and cutting out the mid morning snack.  Lunch- bring healthier foods from home and avoid fast foods.  Dinner- no fried foods. Recommended baked/grilled.  Pap performed today. Refer for colo.  Patient counseled on importance of covid-19 vaccination.    HTN- bp stable on current meds. Continue same.   Thyroid enlargement- will refer for thyroid US for further evaluation. Check TSH.  GERD- stable on QOD protonix. Continue same  Abnormal pelvic exam- will refer for pelvic/transvaginal US.   OSA- clinically stable on cpap. Management per pulmonary.   This visit occurred during the SARS-CoV-2 public health emergency.  Safety protocols were in place, including screening questions prior to the visit, additional usage of staff PPE, and extensive cleaning of exam room while observing appropriate contact time as indicated for disinfecting solutions.           Assessment & Plan:

## 2020-06-25 NOTE — Patient Instructions (Signed)
Please complete lab work prior to leaving. Schedule ultrasounds on the first floor. You should be contacted about scheduling your colonoscopy.  Please continue to work on healthy diet, exercise and weight loss.

## 2020-06-26 ENCOUNTER — Encounter: Payer: Self-pay | Admitting: Family

## 2020-06-26 LAB — CYTOLOGY - PAP
Comment: NEGATIVE
Diagnosis: NEGATIVE
High risk HPV: NEGATIVE

## 2020-06-26 LAB — BASIC METABOLIC PANEL
BUN: 17 mg/dL (ref 7–25)
CO2: 25 mmol/L (ref 20–32)
Calcium: 9.2 mg/dL (ref 8.6–10.2)
Chloride: 104 mmol/L (ref 98–110)
Creat: 0.78 mg/dL (ref 0.50–1.10)
Glucose, Bld: 100 mg/dL — ABNORMAL HIGH (ref 65–99)
Potassium: 3.8 mmol/L (ref 3.5–5.3)
Sodium: 139 mmol/L (ref 135–146)

## 2020-06-26 LAB — HEPATITIS C ANTIBODY
Hepatitis C Ab: NONREACTIVE
SIGNAL TO CUT-OFF: 0.01 (ref <1.00)

## 2020-06-26 LAB — HEPATIC FUNCTION PANEL
AG Ratio: 2 (calc) (ref 1.0–2.5)
ALT: 16 U/L (ref 6–29)
AST: 14 U/L (ref 10–35)
Albumin: 4.1 g/dL (ref 3.6–5.1)
Alkaline phosphatase (APISO): 80 U/L (ref 31–125)
Bilirubin, Direct: 0.1 mg/dL (ref 0.0–0.2)
Globulin: 2.1 g/dL (calc) (ref 1.9–3.7)
Indirect Bilirubin: 0.2 mg/dL (calc) (ref 0.2–1.2)
Total Bilirubin: 0.3 mg/dL (ref 0.2–1.2)
Total Protein: 6.2 g/dL (ref 6.1–8.1)

## 2020-06-26 LAB — LIPID PANEL
Cholesterol: 121 mg/dL (ref <200)
HDL: 47 mg/dL — ABNORMAL LOW (ref >=50)
LDL Cholesterol (Calc): 58 mg/dL (calc)
Non-HDL Cholesterol (Calc): 74 mg/dL (calc) (ref <130)
Total CHOL/HDL Ratio: 2.6 (calc) (ref <5.0)
Triglycerides: 76 mg/dL (ref <150)

## 2020-06-26 LAB — CBC WITH DIFFERENTIAL/PLATELET
Absolute Monocytes: 467 cells/uL (ref 200–950)
Basophils Absolute: 57 cells/uL (ref 0–200)
Basophils Relative: 0.7 %
Eosinophils Absolute: 139 cells/uL (ref 15–500)
Eosinophils Relative: 1.7 %
HCT: 37.9 % (ref 35.0–45.0)
Hemoglobin: 12.9 g/dL (ref 11.7–15.5)
Lymphs Abs: 2370 cells/uL (ref 850–3900)
MCH: 31.5 pg (ref 27.0–33.0)
MCHC: 34 g/dL (ref 32.0–36.0)
MCV: 92.7 fL (ref 80.0–100.0)
MPV: 11.1 fL (ref 7.5–12.5)
Monocytes Relative: 5.7 %
Neutro Abs: 5166 cells/uL (ref 1500–7800)
Neutrophils Relative %: 63 %
Platelets: 298 10*3/uL (ref 140–400)
RBC: 4.09 10*6/uL (ref 3.80–5.10)
RDW: 13.1 % (ref 11.0–15.0)
Total Lymphocyte: 28.9 %
WBC: 8.2 10*3/uL (ref 3.8–10.8)

## 2020-06-26 MED ORDER — METRONIDAZOLE 500 MG PO TABS: 500.0000 mg | ORAL_TABLET | Freq: Two times a day (BID) | ORAL | 0 refills | Status: DC

## 2020-06-26 MED FILL — metroNIDAZOLE 500 MG TABS: 500 | 7 days supply | Qty: 14 | Fill #0

## 2020-06-26 NOTE — Telephone Encounter (Signed)
Reviewed labs with pt including finding of trichomonas.  Advised pt to begin metronidazole, notify partner so partner can also seek treatment and avoid alcohol use while taking metronidazole.   Pt verbalizes understanding.

## 2020-06-26 NOTE — Telephone Encounter (Signed)
Provider spoke to patient

## 2020-06-29 ENCOUNTER — Ambulatory Visit (HOSPITAL_BASED_OUTPATIENT_CLINIC_OR_DEPARTMENT_OTHER)
Admission: RE | Admit: 2020-06-29 | Discharge: 2020-06-29 | Disposition: A | Discharge status: Home / Self Care | Payer: 59 | Source: Ambulatory Visit | Attending: Family | Admitting: Family

## 2020-06-29 ENCOUNTER — Ambulatory Visit (HOSPITAL_BASED_OUTPATIENT_CLINIC_OR_DEPARTMENT_OTHER): Payer: 59

## 2020-06-29 ENCOUNTER — Other Ambulatory Visit: Payer: 59

## 2020-06-29 ENCOUNTER — Other Ambulatory Visit: Payer: Self-pay

## 2020-06-29 DIAGNOSIS — Z1231 Encounter for screening mammogram for malignant neoplasm of breast: Secondary | ICD-10-CM | POA: Insufficient documentation

## 2020-07-06 ENCOUNTER — Ambulatory Visit (HOSPITAL_BASED_OUTPATIENT_CLINIC_OR_DEPARTMENT_OTHER): Payer: 59

## 2020-07-09 MED FILL — OMEPRAZOLE 40 MG CPDR: 40 | 90 days supply | Qty: 90 | Fill #1

## 2020-07-09 MED FILL — GABAPENTIN 300 MG CAPSULE: 300 | 30 days supply | Qty: 60 | Fill #1

## 2020-07-13 ENCOUNTER — Encounter: Payer: 59 | Admitting: Family

## 2020-07-13 ENCOUNTER — Ambulatory Visit (HOSPITAL_BASED_OUTPATIENT_CLINIC_OR_DEPARTMENT_OTHER): Admission: RE | Admit: 2020-07-13 | Payer: 59 | Source: Ambulatory Visit

## 2020-07-13 ENCOUNTER — Ambulatory Visit (HOSPITAL_BASED_OUTPATIENT_CLINIC_OR_DEPARTMENT_OTHER): Payer: 59

## 2020-07-16 ENCOUNTER — Other Ambulatory Visit: Payer: Self-pay | Admitting: Family

## 2020-07-16 ENCOUNTER — Encounter: Payer: Self-pay | Admitting: Family

## 2020-07-16 NOTE — Telephone Encounter (Signed)
Patient sent mychart message.

## 2020-07-17 ENCOUNTER — Other Ambulatory Visit (HOSPITAL_COMMUNITY)
Admission: RE | Admit: 2020-07-17 | Discharge: 2020-07-17 | Disposition: A | Discharge status: Home / Self Care | Payer: 59 | Source: Ambulatory Visit | Attending: Family | Admitting: Family

## 2020-07-17 ENCOUNTER — Other Ambulatory Visit: Payer: Self-pay

## 2020-07-17 ENCOUNTER — Ambulatory Visit: Payer: 59 | Admitting: Family

## 2020-07-17 ENCOUNTER — Encounter: Payer: Self-pay | Admitting: Family

## 2020-07-17 VITALS — BP 118/77 | HR 74 | Temp 98.7°F | Resp 16 | Ht 65.0 in | Wt 249.0 lb

## 2020-07-17 DIAGNOSIS — R300 Dysuria: Secondary | ICD-10-CM | POA: Diagnosis not present

## 2020-07-17 DIAGNOSIS — N898 Other specified noninflammatory disorders of vagina: Secondary | ICD-10-CM | POA: Insufficient documentation

## 2020-07-17 NOTE — Patient Instructions (Signed)
Please go to the lab prior to leaving.

## 2020-07-17 NOTE — Progress Notes (Signed)
Subjective:    Patient ID: Denise Austin, female    DOB: [birth year], 49 y.o.   MRN: [id number]  HPI  Patient is a 49 yr old female who presents today with chief complaint of vaginal discharge.  We last saw her on 06/25/20 for cpx with pap and pap noted incidental finding of trichomonas. We treated the patient with metronidazole.  She denies any sexual activity since she began treatment with metronidazole, however she continues to have some vaginal discharge with "burning."    Review of Systems See HPI  Past Medical History:  Diagnosis Date  . GERD (gastroesophageal reflux disease)   . Hypertension   . OSA (obstructive sleep apnea) 04/15/2018  . PONV (postoperative nausea and vomiting)   . Uterine fibroid      Social History   Socioeconomic History  . Marital status: Single    Spouse name: Not on file  . Number of children: Not on file  . Years of education: Not on file  . Highest education level: Not on file  Occupational History  . Not on file  Tobacco Use  . Smoking status: Never Smoker  . Smokeless tobacco: Never Used  Vaping Use  . Vaping Use: Never used  Substance and Sexual Activity  . Alcohol use: Yes    Alcohol/week: 0.0 standard drinks    Comment: occasional 1-2 times a month  . Drug use: No  . Sexual activity: Yes    Partners: Male    Birth control/protection: None  Other Topics Concern  . 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 Difficulty of Paying Living Expenses: Not on file  Food Insecurity:   . Worried About Charity fundraiser in the Last Year: Not on file  . Ran Out of Food in the Last Year: Not on file  Transportation  Needs:   . Lack of Transportation (Medical): Not on file  . Lack of Transportation (Non-Medical): Not on file  Physical Activity:   . Days of Exercise per Week: Not on file  . Minutes of Exercise per Session: Not on file  Stress:   . Feeling of Stress : Not on file  Social Connections:   . Frequency of Communication with Friends and Family: Not on file  . Frequency of Social Gatherings with Friends and Family: Not on file  . Attends Religious Services: Not on file  . Active Member of Clubs or Organizations: Not on file  . Attends Archivist Meetings: Not on file  . Marital Status: Not on file  Intimate Partner Violence:   . Fear of Current or Ex-Partner: Not on file  . Emotionally Abused: Not on file  . Physically Abused: Not on file  . Sexually Abused: Not on file    Past Surgical History:  Procedure Laterality Date  . BUNIONECTOMY    . CHOLECYSTECTOMY    . LUMBAR LAMINECTOMY/DECOMPRESSION MICRODISCECTOMY Right 04/22/2017   Procedure: Extraforaminal Microdiscectomy - Lumbar two-Lumbar three - right;  Surgeon: Eustace Moore, MD;  Location: [place];  Service: Neurosurgery;  Laterality: Right;  . lumbar surgery revision  04/24/2017  . WOUND EXPLORATION N/A 05/22/2017   Procedure: Revision Of Lumbar Wound;  Surgeon: Eustace Moore, MD;  Location: [place];  Service: Neurosurgery;  Laterality: N/A;  Lumbar wound revision    Family History  Problem Relation Age of Onset  . Hypertension Mother   . Diabetes Mother   . Hypertension Father        smoker  . COPD Father   . Single kidney Sister        removed as a child  . Heart attack Neg Hx   . Hyperlipidemia Neg Hx   . Sudden death Neg Hx     Allergies  Allergen Reactions  . Oxycodone-Acetaminophen Nausea And Vomiting and Rash    PT STATES SHE CAN TOLERATE    Current Outpatient Medications on File Prior to Visit  Medication Sig Dispense Refill  . amLODipine (NORVASC) 10 MG tablet TAKE 1 TABLET BY MOUTH DAILY. 90  tablet 1  . BIOTIN PO Take 1 tablet by mouth daily. Gummy vitamin    . gabapentin (NEURONTIN) 300 MG capsule TAKE 1 CAPSULE BY MOUTH 2 TIMES DAILY. 60 capsule 4  . metoprolol succinate (TOPROL-XL) 25 MG 24 hr tablet TAKE 1 TABLET BY MOUTH DAILY. 90 tablet 1  . omeprazole (PRILOSEC) 40 MG capsule TAKE 1 CAPSULE BY MOUTH ONCE DAILY 90 capsule 1  . valACYclovir (VALTREX) 500 MG tablet TAKE 1 TABLET BY MOUTH DAILY. 90 tablet 1   No current facility-administered medications on file prior to visit.    BP 118/77 (BP Location: Right Arm, Patient Position: Sitting, Cuff Size: Large)   Pulse 74   Temp 98.7 F (37.1 C) (Oral)   Resp 16   Ht 5\' 5"  (1.651 m)   Wt 249 lb (112.9 kg)   LMP 04/02/2018   SpO2 99%   BMI 41.44 kg/m       Objective:   Physical Exam Constitutional:      Appearance: Normal appearance. She is not diaphoretic.  Genitourinary:    General: Normal vulva.     Pubic Area: No rash.      Labia:        Right: No rash.        Left: No rash.      Comments: Copious creamy/yellow vaginal discharge noted Neurological:     Mental Status: She is alert.           Assessment & Plan:  Vaginal discharge- swab obtained and will be sent for GC/Chlamydia/BV/Yeast/Trich.  Dysuria- check UA/Culture.  Further recommendations pending review of above results.   This visit occurred during the SARS-CoV-2 public health emergency.  Safety protocols were in place, including screening questions prior to the visit, additional usage of staff PPE, and extensive cleaning of exam room while observing appropriate contact time as indicated for disinfecting solutions.

## 2020-07-18 ENCOUNTER — Telehealth: Payer: Self-pay | Admitting: Family

## 2020-07-18 LAB — URINALYSIS, ROUTINE W REFLEX MICROSCOPIC
Bacteria, UA: NONE SEEN /HPF
Bilirubin Urine: NEGATIVE
Glucose, UA: NEGATIVE
Hgb urine dipstick: NEGATIVE
Hyaline Cast: NONE SEEN /LPF
Ketones, ur: NEGATIVE
Nitrite: NEGATIVE
Protein, ur: NEGATIVE
RBC / HPF: NONE SEEN /HPF (ref 0–2)
Specific Gravity, Urine: 1.02 (ref 1.001–1.03)
Squamous Epithelial / HPF: NONE SEEN /HPF (ref <=5)
pH: 6 (ref 5.0–8.0)

## 2020-07-18 LAB — URINE CULTURE
MICRO NUMBER:: 10976555
SPECIMEN QUALITY:: ADEQUATE

## 2020-07-18 NOTE — Telephone Encounter (Signed)
Caller name: Larie Call back number: [phone]  Patient would like her lab results.

## 2020-07-19 LAB — CERVICOVAGINAL ANCILLARY ONLY
Bacterial Vaginitis (gardnerella): NEGATIVE
Candida Glabrata: NEGATIVE
Candida Vaginitis: POSITIVE — AB
Chlamydia: NEGATIVE
Comment: NEGATIVE
Comment: NEGATIVE
Comment: NEGATIVE
Comment: NEGATIVE
Comment: NEGATIVE
Comment: NORMAL
Neisseria Gonorrhea: NEGATIVE
Trichomonas: NEGATIVE

## 2020-07-20 ENCOUNTER — Other Ambulatory Visit: Payer: Self-pay

## 2020-07-20 ENCOUNTER — Ambulatory Visit (HOSPITAL_BASED_OUTPATIENT_CLINIC_OR_DEPARTMENT_OTHER)
Admission: RE | Admit: 2020-07-20 | Discharge: 2020-07-20 | Disposition: A | Discharge status: Home / Self Care | Payer: 59 | Source: Ambulatory Visit | Attending: Family | Admitting: Family

## 2020-07-20 ENCOUNTER — Telehealth: Payer: Self-pay | Admitting: Family

## 2020-07-20 ENCOUNTER — Other Ambulatory Visit: Payer: Self-pay | Admitting: Family

## 2020-07-20 ENCOUNTER — Ambulatory Visit: Payer: 59 | Admitting: Family

## 2020-07-20 DIAGNOSIS — E010 Iodine-deficiency related diffuse (endemic) goiter: Secondary | ICD-10-CM | POA: Diagnosis not present

## 2020-07-20 DIAGNOSIS — E079 Disorder of thyroid, unspecified: Secondary | ICD-10-CM | POA: Diagnosis not present

## 2020-07-20 DIAGNOSIS — Z01411 Encounter for gynecological examination (general) (routine) with abnormal findings: Secondary | ICD-10-CM | POA: Diagnosis not present

## 2020-07-20 DIAGNOSIS — N838 Other noninflammatory disorders of ovary, fallopian tube and broad ligament: Secondary | ICD-10-CM | POA: Diagnosis not present

## 2020-07-20 MED ORDER — FLUCONAZOLE 150 MG PO TABS: ORAL_TABLET | ORAL | 0 refills | Status: DC

## 2020-07-20 MED FILL — FLUCONAZOLE 150 MG TABS: 150 | 3 days supply | Qty: 2 | Fill #0

## 2020-07-20 NOTE — Telephone Encounter (Signed)
Please contact pt and let her know that I reviewed her testing. Testing was negative for trichomonas, gonorrhea, and chlamydia.  It does show yeast.  I am sending an rx to her pharmacy for diflucan.

## 2020-07-20 NOTE — Telephone Encounter (Signed)
Please advise about results

## 2020-07-20 NOTE — Telephone Encounter (Signed)
Please see result note 

## 2020-07-20 NOTE — Telephone Encounter (Signed)
Patient has been advised of results and prescription

## 2020-07-20 NOTE — Telephone Encounter (Signed)
Patient advised of results and medication 

## 2020-07-23 MED FILL — VALACYCLOVIR HCL 500 MG TAB: 500 | 90 days supply | Qty: 90 | Fill #0

## 2020-07-23 MED FILL — VALACYCLOVIR HCL 500 MG TAB: 500 | 90 days supply | Qty: 90 | Fill #0 | Status: TO

## 2020-08-13 MED FILL — GABAPENTIN 300 MG CAPSULE: 300 | 30 days supply | Qty: 60 | Fill #2

## 2020-09-05 ENCOUNTER — Other Ambulatory Visit: Payer: Self-pay | Admitting: Family

## 2020-09-05 MED FILL — AMLODIPINE BESYLATE 10 MG T: 10 | 90 days supply | Qty: 90 | Fill #0

## 2020-09-05 MED FILL — METOPROLOL SUCCINATE ER 25: 25 | 90 days supply | Qty: 90 | Fill #0

## 2020-09-10 ENCOUNTER — Encounter: Payer: Self-pay | Admitting: Family

## 2020-10-09 ENCOUNTER — Other Ambulatory Visit (INDEPENDENT_AMBULATORY_CARE_PROVIDER_SITE_OTHER): Payer: Self-pay | Admitting: Physical Medicine and Rehabilitation

## 2020-10-09 ENCOUNTER — Other Ambulatory Visit: Payer: Self-pay | Admitting: Family

## 2020-10-09 MED FILL — OMEPRAZOLE 40 MG CPDR: 40 | 90 days supply | Qty: 90 | Fill #0

## 2020-10-09 MED FILL — GABAPENTIN 300 MG CAPSULE: 300 | 30 days supply | Qty: 60 | Fill #0

## 2020-10-09 NOTE — Telephone Encounter (Signed)
Please advise 

## 2020-10-15 ENCOUNTER — Ambulatory Visit (AMBULATORY_SURGERY_CENTER): Payer: Self-pay | Admitting: *Deleted

## 2020-10-15 ENCOUNTER — Other Ambulatory Visit: Payer: Self-pay

## 2020-10-15 ENCOUNTER — Other Ambulatory Visit: Payer: Self-pay | Admitting: Gastroenterology

## 2020-10-15 VITALS — Ht 65.0 in | Wt 252.0 lb

## 2020-10-15 DIAGNOSIS — Z1211 Encounter for screening for malignant neoplasm of colon: Secondary | ICD-10-CM

## 2020-10-15 DIAGNOSIS — Z01818 Encounter for other preprocedural examination: Secondary | ICD-10-CM

## 2020-10-15 MED ORDER — SUPREP BOWEL PREP KIT 17.5-3.13-1.6 GM/177ML PO SOLN: 1.0000 | Freq: Once | ORAL | 0 refills | Status: DC

## 2020-10-15 MED FILL — SUPREP BOWEL PREP KIT: 17.5-3.13-1 | 1 days supply | Qty: 354 | Fill #0

## 2020-10-15 NOTE — Progress Notes (Signed)
No egg or soy allergy known to patient  No issues with past sedation with any surgeries or procedures No intubation problems in the past  No FH of Malignant Hyperthermia No diet pills per patient No home 02 use per patient  No blood thinners per patient  Pt denies issues with constipation  No A fib or A flutter  EMMI video to pt or via MyChart  COVID 19 guidelines implemented in [place] today with Pt and RN  Pt is not vax'd- will do covid test 1-6 Thursday at 830 am   Due to the COVID-19 pandemic we are asking patients to follow certain guidelines.  Pt aware of COVID protocols and [facility] guidelines

## 2020-10-29 ENCOUNTER — Encounter: Payer: Self-pay | Admitting: Gastroenterology

## 2020-10-29 MED FILL — SUPREP BOWEL PREP KIT: 17.5-3.13-1 | 1 days supply | Qty: 354 | Fill #0

## 2020-11-05 ENCOUNTER — Encounter: Payer: 59 | Admitting: Gastroenterology

## 2020-11-05 MED FILL — VALACYCLOVIR HCL 500 MG TAB: 500 | 90 days supply | Qty: 90 | Fill #1

## 2020-11-05 MED FILL — GABAPENTIN 300 MG CAPSULE: 300 | 30 days supply | Qty: 60 | Fill #0

## 2020-11-06 ENCOUNTER — Other Ambulatory Visit: Payer: Self-pay | Admitting: Orthopaedic Surgery

## 2020-11-06 MED ORDER — PREDNISONE 10 MG (21) PO TBPK: ORAL_TABLET | ORAL | 0 refills | Status: DC

## 2020-11-06 MED FILL — predniSONE 10 MG TABS: 10 | 6 days supply | Qty: 21 | Fill #0

## 2020-11-23 DIAGNOSIS — H11153 Pinguecula, bilateral: Secondary | ICD-10-CM | POA: Diagnosis not present

## 2020-11-23 DIAGNOSIS — H524 Presbyopia: Secondary | ICD-10-CM | POA: Diagnosis not present

## 2020-11-23 DIAGNOSIS — H5213 Myopia, bilateral: Secondary | ICD-10-CM | POA: Diagnosis not present

## 2020-11-23 DIAGNOSIS — H52223 Regular astigmatism, bilateral: Secondary | ICD-10-CM | POA: Diagnosis not present

## 2020-11-23 DIAGNOSIS — H2513 Age-related nuclear cataract, bilateral: Secondary | ICD-10-CM | POA: Diagnosis not present

## 2020-12-04 MED FILL — AMLODIPINE BESYLATE 10 MG T: 10 | 90 days supply | Qty: 90 | Fill #1

## 2020-12-04 MED FILL — GABAPENTIN 300 MG CAPSULE: 300 | 30 days supply | Qty: 60 | Fill #1

## 2020-12-04 MED FILL — METOPROLOL SUCCINATE ER 25: 25 | 90 days supply | Qty: 90 | Fill #1

## 2020-12-11 DIAGNOSIS — G4733 Obstructive sleep apnea (adult) (pediatric): Secondary | ICD-10-CM | POA: Diagnosis not present

## 2020-12-17 ENCOUNTER — Ambulatory Visit (AMBULATORY_SURGERY_CENTER): Payer: 59

## 2020-12-17 ENCOUNTER — Other Ambulatory Visit: Payer: Self-pay

## 2020-12-17 VITALS — Ht 65.0 in | Wt 252.0 lb

## 2020-12-17 DIAGNOSIS — Z1211 Encounter for screening for malignant neoplasm of colon: Secondary | ICD-10-CM

## 2020-12-17 DIAGNOSIS — Z01818 Encounter for other preprocedural examination: Secondary | ICD-10-CM

## 2020-12-17 NOTE — Progress Notes (Signed)
No egg or soy allergy known to patient  No issues with past sedation with any surgeries or procedures No intubation problems in the past  No FH of Malignant Hyperthermia No diet pills per patient No home 02 use per patient  No blood thinners per patient  Pt denies issues with constipation  No A fib or A flutter  EMMI video to pt or via [place] 19 guidelines implemented in PV today with Pt and RN  Pt is NOT fully vaccinated  for Covid    Pt denies loose or missing teeth, denies dentures, partials, dental implants, capped or bonded teeth   PT HAS SUPREP at home due to previous previsit already done.    Due to the COVID-19 pandemic we are asking patients to follow certain guidelines.  Pt aware of COVID protocols and [facility] guidelines    VIRTUAL PREVISIT  Pt had to r/s colon due to death of her father the day of her procedure.

## 2020-12-19 ENCOUNTER — Encounter: Payer: Self-pay | Admitting: Gastroenterology

## 2020-12-20 DIAGNOSIS — Z1159 Encounter for screening for other viral diseases: Secondary | ICD-10-CM | POA: Diagnosis not present

## 2020-12-24 ENCOUNTER — Encounter: Payer: Self-pay | Admitting: Gastroenterology

## 2020-12-24 ENCOUNTER — Ambulatory Visit (AMBULATORY_SURGERY_CENTER): Payer: 59 | Admitting: Gastroenterology

## 2020-12-24 ENCOUNTER — Other Ambulatory Visit: Payer: Self-pay

## 2020-12-24 VITALS — BP 131/77 | HR 76 | Temp 97.8°F | Resp 16 | Ht 65.0 in | Wt 252.0 lb

## 2020-12-24 DIAGNOSIS — Z1211 Encounter for screening for malignant neoplasm of colon: Secondary | ICD-10-CM

## 2020-12-24 DIAGNOSIS — D122 Benign neoplasm of ascending colon: Secondary | ICD-10-CM

## 2020-12-24 DIAGNOSIS — D123 Benign neoplasm of transverse colon: Secondary | ICD-10-CM

## 2020-12-24 MED ORDER — SODIUM CHLORIDE 0.9 % IV SOLN: 500.0000 mL | Freq: Once | INTRAVENOUS | Status: DC

## 2020-12-24 NOTE — Progress Notes (Signed)
pt tolerated well. VSS. awake and to recovery. Report given to RN. OA removed without trauma.

## 2020-12-24 NOTE — Progress Notes (Signed)
Pt's states no medical or surgical changes since previsit or office visit. 

## 2020-12-24 NOTE — Progress Notes (Signed)
Called to room to assist during endoscopic procedure.  Patient ID and intended procedure confirmed with present staff. Received instructions for my participation in the procedure from the performing physician.  

## 2020-12-24 NOTE — Op Note (Signed)
[place] Patient Name: Denise Austin Procedure Date: 12/24/2020 10:43 AM MRN: [id number] Endoscopist: Jackquline Denmark , MD Age: 50 Referring MD:  Date of Birth: [birth year] Gender: Female Account #: [id number] Procedure:                Colonoscopy Indications:              Screening for colorectal malignant neoplasm Medicines:                Monitored Anesthesia Care Procedure:                Pre-Anesthesia Assessment:                           - Prior to the procedure, a History and Physical                            was performed, and patient medications and                            allergies were reviewed. The patient's tolerance of                            previous anesthesia was also reviewed. The risks                            and benefits of the procedure and the sedation                            options and risks were discussed with the patient.                            All questions were answered, and informed consent                            was obtained. Prior Anticoagulants: The patient has                            taken no previous anticoagulant or antiplatelet                            agents. ASA Grade Assessment: II - A patient with                            mild systemic disease. After reviewing the risks                            and benefits, the patient was deemed in                            satisfactory condition to undergo the procedure.                           After obtaining informed consent, the colonoscope  was passed under direct vision. Throughout the                            procedure, the patient's blood pressure, pulse, and                            oxygen saturations were monitored continuously. The                            Olympus PCF-H190DL ([id number]) Colonoscope was                            introduced through the anus and advanced to the the                            cecum,  identified by appendiceal orifice and                            ileocecal valve. The colonoscopy was performed                            without difficulty. The patient tolerated the                            procedure well. The quality of the bowel                            preparation was good. The ileocecal valve,                            appendiceal orifice, and rectum were photographed.                            The colon was highly redundant. Scope In: 10:49:35 AM Scope Out: 11:10:14 AM Scope Withdrawal Time: 0 hours 13 minutes 56 seconds  Total Procedure Duration: 0 hours 20 minutes 39 seconds  Findings:                 Two sessile polyps were found in the proximal                            transverse colon and distal ascending colon. The                            polyps were 6 to 8 mm in size. These polyps were                            removed with a cold snare. Resection and retrieval                            were complete.                           Non-bleeding internal hemorrhoids were found during  retroflexion. The hemorrhoids were small.                           The exam was otherwise without abnormality on                            direct and retroflexion views. Complications:            No immediate complications. Estimated Blood Loss:     Estimated blood loss: none. Impression:               - Two 6 to 8 mm polyps in the proximal transverse                            colon and in the distal ascending colon, removed                            with a cold snare. Resected and retrieved.                           - Non-bleeding internal hemorrhoids.                           - The examination was otherwise normal on direct                            and retroflexion views. Recommendation:           - Patient has a contact number available for                            emergencies. The signs and symptoms of potential                             delayed complications were discussed with the                            patient. Return to normal activities tomorrow.                            Written discharge instructions were provided to the                            patient.                           - Resume previous diet.                           - Continue present medications.                           - Await pathology results.                           - Repeat colonoscopy for surveillance based on  pathology results.                           - The findings and recommendations were discussed                            with the patient's family. Jackquline Denmark, MD 12/24/2020 11:14:08 AM This report has been signed electronically.

## 2020-12-24 NOTE — Patient Instructions (Signed)
YOU HAD AN ENDOSCOPIC PROCEDURE TODAY AT THE [facility]:   Refer to the procedure report that was given to you for any specific questions about what was found during the examination.  If the procedure report does not answer your questions, please call your gastroenterologist to clarify.  If you requested that your care partner not be given the details of your procedure findings, then the procedure report has been included in a sealed envelope for you to review at your convenience later.  YOU SHOULD EXPECT: Some feelings of bloating in the abdomen. Passage of more gas than usual.  Walking can help get rid of the air that was put into your GI tract during the procedure and reduce the bloating. If you had a lower endoscopy (such as a colonoscopy or flexible sigmoidoscopy) you may notice spotting of blood in your stool or on the toilet paper. If you underwent a bowel prep for your procedure, you may not have a normal bowel movement for a few days.  Please Note:  You might notice some irritation and congestion in your nose or some drainage.  This is from the oxygen used during your procedure.  There is no need for concern and it should clear up in a day or so.  SYMPTOMS TO REPORT IMMEDIATELY:   Following lower endoscopy (colonoscopy or flexible sigmoidoscopy):  Excessive amounts of blood in the stool  Significant tenderness or worsening of abdominal pains  Swelling of the abdomen that is new, acute  Fever of 100F or higher   Following upper endoscopy (EGD)  Vomiting of blood or coffee ground material  New chest pain or pain under the shoulder blades  Painful or persistently difficult swallowing  New shortness of breath  Fever of 100F or higher  Black, tarry-looking stools  For urgent or emergent issues, a gastroenterologist can be reached at any hour by calling [phone]. Do not use MyChart messaging for urgent concerns.    DIET:  We do recommend a small meal at first, but  then you may proceed to your regular diet.  Drink plenty of fluids but you should avoid alcoholic beverages for 24 hours.  ACTIVITY:  You should plan to take it easy for the rest of today and you should NOT DRIVE or use heavy machinery until tomorrow (because of the sedation medicines used during the test).    FOLLOW UP: Our staff will call the number listed on your records 48-72 hours following your procedure to check on you and address any questions or concerns that you may have regarding the information given to you following your procedure. If we do not reach you, we will leave a message.  We will attempt to reach you two times.  During this call, we will ask if you have developed any symptoms of COVID 19. If you develop any symptoms (ie: fever, flu-like symptoms, shortness of breath, cough etc.) before then, please call [phone].  If you test positive for Covid 19 in the 2 weeks post procedure, please call and report this information to us.    If any biopsies were taken you will be contacted by phone or by letter within the next 1-3 weeks.  Please call us at [phone] if you have not heard about the biopsies in 3 weeks.    SIGNATURES/CONFIDENTIALITY: You and/or your care partner have signed paperwork which will be entered into your electronic medical record.  These signatures attest to the fact that that the information above on   your After Visit Summary has been reviewed and is understood.  Full responsibility of the confidentiality of this discharge information lies with you and/or your care-partner. 

## 2020-12-26 ENCOUNTER — Telehealth: Payer: Self-pay | Admitting: *Deleted

## 2020-12-26 NOTE — Telephone Encounter (Signed)
1. Have you developed a fever since your procedure? no  2.   Have you had an respiratory symptoms (SOB or cough) since your procedure? no  3.   Have you tested positive for COVID 19 since your procedure no  4.   Have you had any family members/close contacts diagnosed with the COVID 19 since your procedure?  no   If yes to any of these questions please route to Joylene John, RN and Joella Prince, RN Follow up Call-  Call back number 12/24/2020  Post procedure Call Back phone  # [phone]  Permission to leave phone message Yes  Some recent data might be hidden     Patient questions:  Do you have a fever, pain , or abdominal swelling? No. Pain Score  0 *  Have you tolerated food without any problems? Yes.    Have you been able to return to your normal activities? Yes.    Do you have any questions about your discharge instructions: Diet   No. Medications  No. Follow up visit  No.  Do you have questions or concerns about your Care? No.  Actions: * If pain score is 4 or above: No action needed, pain <4.

## 2020-12-28 ENCOUNTER — Ambulatory Visit: Payer: 59 | Admitting: Family

## 2021-01-04 ENCOUNTER — Ambulatory Visit: Payer: 59 | Admitting: Family

## 2021-01-04 MED FILL — GABAPENTIN 300 MG CAPSULE: 300 | 30 days supply | Qty: 60 | Fill #2

## 2021-01-04 MED FILL — OMEPRAZOLE 40 MG CPDR: 40 | 90 days supply | Qty: 90 | Fill #1

## 2021-01-11 ENCOUNTER — Ambulatory Visit: Payer: 59 | Admitting: Family

## 2021-01-19 ENCOUNTER — Encounter: Payer: Self-pay | Admitting: Gastroenterology

## 2021-01-25 ENCOUNTER — Ambulatory Visit: Payer: 59 | Admitting: Family

## 2021-01-25 ENCOUNTER — Encounter: Payer: Self-pay | Admitting: Family

## 2021-01-25 ENCOUNTER — Other Ambulatory Visit: Payer: Self-pay | Admitting: Family

## 2021-01-25 ENCOUNTER — Other Ambulatory Visit: Payer: Self-pay

## 2021-01-25 DIAGNOSIS — I10 Essential (primary) hypertension: Secondary | ICD-10-CM | POA: Diagnosis not present

## 2021-01-25 DIAGNOSIS — B009 Herpesviral infection, unspecified: Secondary | ICD-10-CM

## 2021-01-25 DIAGNOSIS — K219 Gastro-esophageal reflux disease without esophagitis: Secondary | ICD-10-CM

## 2021-01-25 DIAGNOSIS — G4733 Obstructive sleep apnea (adult) (pediatric): Secondary | ICD-10-CM

## 2021-01-25 DIAGNOSIS — R739 Hyperglycemia, unspecified: Secondary | ICD-10-CM

## 2021-01-25 MED ORDER — OMEPRAZOLE 40 MG PO CPDR: 40.0000 mg | DELAYED_RELEASE_CAPSULE | Freq: Every day | ORAL | 1 refills | Status: DC

## 2021-01-25 MED ORDER — VALACYCLOVIR HCL 500 MG PO TABS: 500.0000 mg | ORAL_TABLET | Freq: Every day | ORAL | 1 refills | Status: DC

## 2021-01-25 MED ORDER — AMLODIPINE BESYLATE 10 MG PO TABS: 10.0000 mg | ORAL_TABLET | Freq: Every day | ORAL | 1 refills | Status: DC

## 2021-01-25 MED ORDER — METOPROLOL SUCCINATE ER 25 MG PO TB24: 25.0000 mg | ORAL_TABLET | Freq: Every day | ORAL | 1 refills | Status: DC

## 2021-01-25 NOTE — Progress Notes (Signed)
Subjective:    Patient ID: Denise Austin, female    DOB: Nov 26, 1970, 50 y.o.   MRN: [id number]  HPI  Patient is a 50 yr old female who presents today for follow up.  HTN- maintained on amlodipine 10mg  once daily. Occasional ankle swelling.   BP Readings from Last 3 Encounters:  01/25/21 137/87  12/24/20 131/77  07/17/20 118/77   GERD- maintained on omeprazole 40mg  once daily. Uses it about 2-3 times a week.    HSV2- uses valtrex. No breakouts.    Wt Readings from Last 3 Encounters:  01/25/21 257 lb (116.6 kg)  12/24/20 252 lb (114.3 kg)  12/17/20 252 lb (114.3 kg)   Chronic back pain- following with Dr. Ernestina Patches, gabapentin helps.   Hyperglycemia-  Lab Results  Component Value Date   HGBA1C 5.9 01/22/2018   On CPAP- Seeing Dr. Annamaria Boots.   Review of Systems    see HPI  Past Medical History:  Diagnosis Date  . Allergy   . GERD (gastroesophageal reflux disease)   . Hypertension   . OSA (obstructive sleep apnea) 04/15/2018  . PONV (postoperative nausea and vomiting)   . Sleep apnea    wears cpap   . Uterine fibroid      Social History   Socioeconomic History  . Marital status: Single    Spouse name: Not on file  . Number of children: Not on file  . Years of education: Not on file  . Highest education level: Not on file  Occupational History  . Not on file  Tobacco Use  . Smoking status: Never Smoker  . Smokeless tobacco: Never Used  Vaping Use  . Vaping Use: Never used  Substance and Sexual Activity  . Alcohol use: Yes    Alcohol/week: 0.0 standard drinks    Comment: occasional 1-2 times a month  . Drug use: No  . Sexual activity: Yes    Partners: Male    Birth control/protection: None  Other Topics Concern  . 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 BUNIONECTOMY    . CHOLECYSTECTOMY    . LUMBAR LAMINECTOMY/DECOMPRESSION MICRODISCECTOMY Right 04/22/2017   Procedure: Extraforaminal Microdiscectomy - Lumbar two-Lumbar three - right;  Surgeon: Eustace Moore, MD;  Location: [facility];  Service: Neurosurgery;  Laterality: Right;  . lumbar surgery revision  04/24/2017  . WOUND EXPLORATION N/A 05/22/2017   Procedure: Revision Of Lumbar Wound;  Surgeon: Eustace Moore, MD;  Location: [facility];  Service: Neurosurgery;  Laterality: N/A;  Lumbar wound revision    Family History  Problem Relation Age of Onset  . Hypertension Mother   . Diabetes Mother   . Hypertension Father        smoker  . COPD Father   . Single kidney Sister  removed as a child  . Heart attack Neg Hx   . Hyperlipidemia Neg Hx   . Sudden death Neg Hx   . Colon cancer Neg Hx   . Colon polyps Neg Hx   . Esophageal cancer Neg Hx   . Stomach cancer Neg Hx   . Rectal cancer Neg Hx     Allergies  Allergen Reactions  . Oxycodone-Acetaminophen Nausea And Vomiting and Rash    PT STATES SHE CAN TOLERATE    Current Outpatient Medications on File Prior to Visit  Medication Sig Dispense Refill  . BIOTIN PO Take 1 tablet by mouth daily. Gummy vitamin    . gabapentin (NEURONTIN) 300 MG capsule TAKE 1 CAPSULE BY MOUTH 2 TIMES DAILY. 60 capsule 2   No current facility-administered medications on file prior to visit.    BP 137/87 (BP Location: Right Arm, Patient Position: Sitting, Cuff Size: Large)   Pulse 75   Temp 98.6 F (37 C) (Oral)   Resp 16   Ht 5\' 5"  (1.651 m)   Wt 257 lb (116.6  kg)   LMP 04/02/2018   SpO2 98%   BMI 42.77 kg/m    Objective:   Physical Exam Constitutional:      Appearance: She is well-developed.  Cardiovascular:     Rate and Rhythm: Normal rate and regular rhythm.     Heart sounds: Normal heart sounds. No murmur heard.   Pulmonary:     Effort: Pulmonary effort is normal. No respiratory distress.     Breath sounds: Normal breath sounds. No wheezing.  Psychiatric:        Behavior: Behavior normal.        Thought Content: Thought content normal.        Judgment: Judgment normal.           Assessment & Plan:  HTN- bp stable, continue amlodipine 10mg  and toprol xl 50mg  once daily. Obtain follow up bmet.  HSV2- stable on suppressive therapy. Continue valtrex 500mg  once daily.  GERD- stable with PRN omeprazole 40mg .  Morbid obesity- refer for medical weight management. We discussed trying to keep her calories to 1500 or less a day and add regular exercise.  Hyperglycemia- obtain follow up A1c.   OSA- feeling better on cpap. Management per sleep specialist.   This visit occurred during the SARS-CoV-2 public health emergency.  Safety protocols were in place, including screening questions prior to the visit, additional usage of staff PPE, and extensive cleaning of exam room while observing appropriate contact time as indicated for disinfecting solutions.

## 2021-01-26 LAB — BASIC METABOLIC PANEL
BUN: 17 mg/dL (ref 7–25)
CO2: 23 mmol/L (ref 20–32)
Calcium: 9.3 mg/dL (ref 8.6–10.4)
Chloride: 106 mmol/L (ref 98–110)
Creat: 0.77 mg/dL (ref 0.50–1.05)
Glucose, Bld: 99 mg/dL (ref 65–99)
Potassium: 3.9 mmol/L (ref 3.5–5.3)
Sodium: 139 mmol/L (ref 135–146)

## 2021-01-26 LAB — HEMOGLOBIN A1C
Hgb A1c MFr Bld: 6.1 % of total Hgb — ABNORMAL HIGH (ref <5.7)
Mean Plasma Glucose: 128 mg/dL
eAG (mmol/L): 7.1 mmol/L

## 2021-02-06 ENCOUNTER — Other Ambulatory Visit (INDEPENDENT_AMBULATORY_CARE_PROVIDER_SITE_OTHER): Payer: Self-pay | Admitting: Physical Medicine and Rehabilitation

## 2021-02-06 ENCOUNTER — Other Ambulatory Visit (HOSPITAL_COMMUNITY): Payer: Self-pay

## 2021-02-06 NOTE — Telephone Encounter (Signed)
Please advise 

## 2021-02-07 ENCOUNTER — Other Ambulatory Visit (HOSPITAL_BASED_OUTPATIENT_CLINIC_OR_DEPARTMENT_OTHER): Payer: Self-pay

## 2021-02-07 ENCOUNTER — Other Ambulatory Visit (HOSPITAL_COMMUNITY): Payer: Self-pay

## 2021-02-07 MED ORDER — GABAPENTIN 300 MG PO CAPS
300.0000 mg | ORAL_CAPSULE | Freq: Two times a day (BID) | ORAL | 2 refills | Status: DC
  Filled 2021-02-07 (×2): qty 60, 30d supply, fill #0
  Filled 2021-04-02: qty 60, 30d supply, fill #1
  Filled 2021-05-14: qty 60, 30d supply, fill #2

## 2021-02-07 NOTE — Telephone Encounter (Signed)
Patient is aware that Dr. Ernestina Patches is out of the office and asked if you could send a refill for her.

## 2021-02-21 ENCOUNTER — Encounter: Payer: Self-pay | Admitting: Family

## 2021-02-21 MED ORDER — METOPROLOL SUCCINATE ER 25 MG PO TB24: 50.0000 mg | ORAL_TABLET | Freq: Every day | ORAL | 1 refills | Status: DC

## 2021-02-21 MED ORDER — AMLODIPINE BESYLATE 10 MG PO TABS
5.0000 mg | ORAL_TABLET | Freq: Every day | ORAL | 1 refills | Status: DC
Start: 2021-02-21 — End: 2021-02-25

## 2021-02-25 ENCOUNTER — Other Ambulatory Visit: Payer: Self-pay | Admitting: Family

## 2021-02-25 ENCOUNTER — Other Ambulatory Visit (HOSPITAL_COMMUNITY): Payer: Self-pay

## 2021-02-25 MED ORDER — METOPROLOL SUCCINATE ER 25 MG PO TB24
50.0000 mg | ORAL_TABLET | Freq: Every day | ORAL | 1 refills | Status: DC
  Filled 2021-02-25: qty 180, 90d supply, fill #0

## 2021-02-25 MED ORDER — AMLODIPINE BESYLATE 10 MG PO TABS
5.0000 mg | ORAL_TABLET | Freq: Every day | ORAL | 1 refills | Status: DC
  Filled 2021-02-25: qty 45, 90d supply, fill #0

## 2021-02-25 MED FILL — Valacyclovir HCl Tab 500 MG: ORAL | 90 days supply | Qty: 90 | Fill #0 | Status: AC

## 2021-02-26 ENCOUNTER — Telehealth: Payer: Self-pay | Admitting: Family

## 2021-02-26 ENCOUNTER — Other Ambulatory Visit (HOSPITAL_COMMUNITY): Payer: Self-pay

## 2021-02-26 NOTE — Telephone Encounter (Signed)
Please disregard

## 2021-02-26 NOTE — Telephone Encounter (Signed)
Please disregard below message.  She is taking 1/2 tab for now.

## 2021-02-26 NOTE — Telephone Encounter (Signed)
Please confirm with pt, has she been taking a full tab or 1/2 tab of amlodipine 10mg . The pharmacy wanted to confirm.

## 2021-03-05 ENCOUNTER — Ambulatory Visit (INDEPENDENT_AMBULATORY_CARE_PROVIDER_SITE_OTHER): Payer: 59 | Admitting: Family Medicine

## 2021-03-05 DIAGNOSIS — M25551 Pain in right hip: Secondary | ICD-10-CM

## 2021-03-05 NOTE — Progress Notes (Signed)
Office Visit Note   Patient: Denise Austin           Date of Birth: 1971-04-01           MRN: [id number] Visit Date: 03/05/2021 Requested by: Debbrah Alar, NP [address] [place],  [place] [address] PCP: Debbrah Alar, NP  Subjective: Chief Complaint  Patient presents with  . Right Hip - Pain    Catching sensation in the anterior hip when she stood up 2 days ago. It took longer for the pain to ease up this time - in the past, it would quickly go away. No pain in the lateral hip.    HPI: She is here with right hip pain.  She has had intermittent similar symptoms in the past, but generally it happens very infrequently.  Recently it started bothering her more often and in the past 2 days it has been very uncomfortable.  She feels a catching sensation in the anterior hip when walking.  No pain at rest.  This afternoon she feels better than she felt this morning.              ROS:   All other systems were reviewed and are negative.  Objective: Vital Signs: LMP 04/02/2018   Physical Exam:  General:  Alert and oriented, in no acute distress. Pulm:  Breathing unlabored. Psy:  Normal mood, congruent affect.  Right hip: No tenderness over the greater trochanter.  She has good passive range of motion in both hips, but with flexion and internal rotation on the right she has pain.  No palpable popping.  Imaging: No x-rays today but lumbar x-rays from 2018 show slight dysplasia of the right acetabulum compared to the left.  No significant degenerative change.    Assessment & Plan: 1.  Right hip pain, suspect acetabular labrum tear related to congenital hip dysplasia -Since she has improved, we will watch and wait.  She will try circumducting her hip to see if it will "free it up".  If symptoms worsen again, could contemplate injection either just with anesthetic, or including steroid.  If still no relief, then MRI arthrogram.     Procedures: No  procedures performed        PMFS History: Patient Active Problem List   Diagnosis Date Noted  . Morbid obesity due to excess calories ([facility]) 04/21/2019  . OSA (obstructive sleep apnea) 04/15/2018  . Herpes simplex viral infection 07/10/2017  . S/P lumbar laminectomy 04/22/2017  . GERD (gastroesophageal reflux disease) 07/25/2016  . Hyperglycemia 07/25/2016  . HTN (hypertension) 12/24/2015  . Preventative health care 10/16/2014  . Bradley arthritis 07/06/2014  . S/P excision of ganglion cyst 07/06/2014  . Synovitis 07/06/2014   Past Medical History:  Diagnosis Date  . Allergy   . GERD (gastroesophageal reflux disease)   . Hypertension   . OSA (obstructive sleep apnea) 04/15/2018  . PONV (postoperative nausea and vomiting)   . Sleep apnea    wears cpap   . Uterine fibroid     Family History  Problem Relation Age of Onset  . Hypertension Mother   . Diabetes Mother   . Hypertension Father        smoker  . COPD Father   . Single kidney Sister        removed as a child  . Heart attack Neg Hx   . Hyperlipidemia Neg Hx   . Sudden death Neg Hx   . Colon cancer Neg  Hx   . Colon polyps Neg Hx   . Esophageal cancer Neg Hx   . Stomach cancer Neg Hx   . Rectal cancer Neg Hx     Past Surgical History:  Procedure Laterality Date  . BUNIONECTOMY    . CHOLECYSTECTOMY    . LUMBAR LAMINECTOMY/DECOMPRESSION MICRODISCECTOMY Right 04/22/2017   Procedure: Extraforaminal Microdiscectomy - Lumbar two-Lumbar three - right;  Surgeon: Eustace Moore, MD;  Location: [facility];  Service: Neurosurgery;  Laterality: Right;  . lumbar surgery revision  04/24/2017  . WOUND EXPLORATION N/A 05/22/2017   Procedure: Revision Of Lumbar Wound;  Surgeon: Eustace Moore, MD;  Location: [facility];  Service: Neurosurgery;  Laterality: N/A;  Lumbar wound revision   Social History   Occupational History  . Not on file  Tobacco Use  . Smoking status: Never Smoker  . Smokeless tobacco: Never Used  Vaping Use  .  Vaping Use: Never used  Substance and Sexual Activity  . Alcohol use: Yes    Alcohol/week: 0.0 standard drinks    Comment: occasional 1-2 times a month  . Drug use: No  . Sexual activity: Yes    Partners: Male    Birth control/protection: None

## 2021-03-08 ENCOUNTER — Ambulatory Visit: Payer: 59 | Admitting: Family

## 2021-03-08 ENCOUNTER — Ambulatory Visit: Payer: 59 | Admitting: Family Medicine

## 2021-03-15 ENCOUNTER — Encounter: Payer: Self-pay | Admitting: Family

## 2021-03-15 ENCOUNTER — Other Ambulatory Visit (HOSPITAL_COMMUNITY): Payer: Self-pay

## 2021-03-15 ENCOUNTER — Other Ambulatory Visit: Payer: Self-pay

## 2021-03-15 ENCOUNTER — Ambulatory Visit: Payer: 59 | Admitting: Family

## 2021-03-15 DIAGNOSIS — I10 Essential (primary) hypertension: Secondary | ICD-10-CM | POA: Diagnosis not present

## 2021-03-15 MED ORDER — LOSARTAN POTASSIUM 50 MG PO TABS
50.0000 mg | ORAL_TABLET | Freq: Every day | ORAL | 0 refills | Status: DC
  Filled 2021-03-15: qty 30, 30d supply, fill #0

## 2021-03-15 NOTE — Progress Notes (Signed)
Subjective:   By signing my name below, I, Shehryar Baig, attest that this documentation has been prepared under the direction and in the presence of Debbrah Alar NP. 03/15/2021      Patient ID: Denise Austin, female    DOB: 03-02-1971, 50 y.o.   MRN: [id number]  No chief complaint on file.   HPI Patient is in today for a office visit to discuss her blood pressure.  Hypertension- She complains of swelling in her feat and ankles and headaches after switching her blood pressure medication dosage since her last visit. During last visit her metoprolol dosage was increased from 50 to 100 mg daily PO and her amlodipine dosage was decreased from 10 to 5 mg daily PO.  Past Medical History:  Diagnosis Date  . Allergy   . GERD (gastroesophageal reflux disease)   . Hypertension   . OSA (obstructive sleep apnea) 04/15/2018  . PONV (postoperative nausea and vomiting)   . Sleep apnea    wears cpap   . Uterine fibroid     Past Surgical History:  Procedure Laterality Date  . BUNIONECTOMY    . CHOLECYSTECTOMY    . LUMBAR LAMINECTOMY/DECOMPRESSION MICRODISCECTOMY Right 04/22/2017   Procedure: Extraforaminal Microdiscectomy - Lumbar two-Lumbar three - right;  Surgeon: Eustace Moore, MD;  Location: [facility];  Service: Neurosurgery;  Laterality: Right;  . lumbar surgery revision  04/24/2017  . WOUND EXPLORATION N/A 05/22/2017   Procedure: Revision Of Lumbar Wound;  Surgeon: Eustace Moore, MD;  Location: [facility];  Service: Neurosurgery;  Laterality: N/A;  Lumbar wound revision    Family History  Problem Relation Age of Onset  . Hypertension Mother   . Diabetes Mother   . Hypertension Father        smoker  . COPD Father   . Single kidney Sister        removed as a child  . Heart attack Neg Hx   . Hyperlipidemia Neg Hx   . Sudden death Neg Hx   . Colon cancer Neg Hx   . Colon polyps Neg Hx   . Esophageal cancer Neg Hx   . Stomach cancer Neg Hx   . Rectal cancer Neg Hx      Social History   Socioeconomic History  . Marital status: Single    Spouse name: Not on file  . Number of children: Not on file  . Years of education: Not on file  . Highest education level: Not on file  Occupational History  . Not on file  Tobacco Use  . Smoking status: Never Smoker  . Smokeless tobacco: Never Used  Vaping Use  . Vaping Use: Never used  Substance and Sexual Activity  . Alcohol use: Yes    Alcohol/week: 0.0 standard drinks    Comment: occasional 1-2 times a month  . Drug use: No  . Sexual activity: Yes    Partners: Male    Birth control/protection: None  Other Topics Concern  . 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 amLODipine (NORVASC) 10 MG tablet Take 0.5 tablets (5 mg total) by mouth daily. 90 tablet 1  . BIOTIN PO Take 1 tablet by mouth daily. Gummy vitamin    . gabapentin (NEURONTIN) 300 MG capsule TAKE 1 CAPSULE BY MOUTH 2 TIMES DAILY. 60 capsule 2  . metoprolol succinate (TOPROL-XL) 25 MG 24 hr tablet Take 2 tablets (50 mg total) by mouth daily. 90 tablet 1  . omeprazole (PRILOSEC) 40 MG capsule TAKE 1 CAPSULE (40 MG TOTAL) BY MOUTH DAILY. 90 capsule 1  . valACYclovir (VALTREX) 500 MG tablet TAKE 1 TABLET (500 MG TOTAL) BY MOUTH DAILY. 90 tablet 1   No facility-administered medications prior to visit.    Allergies  Allergen Reactions  .  Oxycodone-Acetaminophen Nausea And Vomiting and Rash    PT STATES SHE CAN TOLERATE    Review of Systems  Cardiovascular: Positive for leg swelling.  Neurological: Positive for headaches.       Objective:    Physical Exam Constitutional:      Appearance: Normal appearance.  HENT:     Head: Normocephalic and atraumatic.     Right Ear: External ear normal.     Left Ear: External ear normal.  Eyes:     Extraocular Movements: Extraocular movements intact.     Pupils: Pupils are equal, round, and reactive to light.  Cardiovascular:     Rate and Rhythm: Normal rate and regular rhythm.     Pulses: Normal pulses.     Heart sounds: Normal heart sounds. No murmur heard. No gallop.      Comments: 1-2+ lower extremity edema bilaterally  Pulmonary:     Effort: Pulmonary effort is normal. No respiratory distress.     Breath sounds: Normal breath sounds. No wheezing, rhonchi or rales.  Skin:    General: Skin is warm and dry.  Neurological:     Mental Status: She is alert and oriented to person, place, and time.  Psychiatric:        Behavior: Behavior normal.     LMP 04/02/2018  Wt Readings from Last 3 Encounters:  01/25/21 257 lb (116.6 kg)  12/24/20 252 lb (114.3 kg)  12/17/20 252 lb (114.3 kg)    Diabetic Foot Exam - Simple   No data filed    Lab Results  Component Value Date   WBC 8.2 06/25/2020   HGB 12.9 06/25/2020   HCT 37.9 06/25/2020   PLT 298 06/25/2020   GLUCOSE 99 01/25/2021   CHOL 121 06/25/2020   TRIG 76 06/25/2020   HDL 47 (L) 06/25/2020   LDLCALC 58 06/25/2020   ALT 16 06/25/2020   AST 14 06/25/2020   NA 139 01/25/2021   K 3.9 01/25/2021   CL 106 01/25/2021   CREATININE 0.77 01/25/2021   BUN 17 01/25/2021   CO2 23 01/25/2021   TSH 1.17 06/25/2020   INR 1.00 04/14/2017   HGBA1C 6.1 (H) 01/25/2021    Lab Results  Component Value Date   TSH 1.17 06/25/2020   Lab Results  Component Value Date   WBC 8.2 06/25/2020   HGB 12.9 06/25/2020    HCT 37.9 06/25/2020   MCV 92.7 06/25/2020   PLT 298 06/25/2020   Lab Results  Component Value Date   NA  139 01/25/2021   K 3.9 01/25/2021   CO2 23 01/25/2021   GLUCOSE 99 01/25/2021   BUN 17 01/25/2021   CREATININE 0.77 01/25/2021   BILITOT 0.3 06/25/2020   ALKPHOS 72 01/21/2018   AST 14 06/25/2020   ALT 16 06/25/2020   PROT 6.2 06/25/2020   ALBUMIN 4.0 01/21/2018   CALCIUM 9.3 01/25/2021   ANIONGAP 6 04/14/2017   GFR 113.26 01/21/2018   Lab Results  Component Value Date   CHOL 121 06/25/2020   Lab Results  Component Value Date   HDL 47 (L) 06/25/2020   Lab Results  Component Value Date   LDLCALC 58 06/25/2020   Lab Results  Component Value Date   TRIG 76 06/25/2020   Lab Results  Component Value Date   CHOLHDL 2.6 06/25/2020   Lab Results  Component Value Date   HGBA1C 6.1 (H) 01/25/2021       Assessment & Plan:   Problem List Items Addressed This Visit   None      No orders of the defined types were placed in this encounter.   I, Debbrah Alar NP, personally preformed the services described in this documentation.  All medical record entries made by the scribe were at my direction and in my presence.  I have reviewed the chart and discharge instructions (if applicable) and agree that the record reflects my personal performance and is accurate and complete. 03/15/2021   I,Shehryar Baig,acting as a Education administrator for Nance Pear, NP.,have documented all relevant documentation on the behalf of Nance Pear, NP,as directed by  Nance Pear, NP while in the presence of Nance Pear, NP.   Shehryar [employer]

## 2021-03-15 NOTE — Patient Instructions (Signed)
Stop amlodipine, start losartan 50mg  once daily.

## 2021-03-15 NOTE — Assessment & Plan Note (Signed)
Pt is bothered by the LE edema on amlodipine 5mg .  Will d/c amlodipine, continue toprol 100mg  and add losartan 50mg  once daily. Follow up in 2 weeks for bp recheck and lab work.

## 2021-03-18 ENCOUNTER — Other Ambulatory Visit (HOSPITAL_COMMUNITY): Payer: Self-pay

## 2021-03-18 ENCOUNTER — Other Ambulatory Visit: Payer: Self-pay | Admitting: Orthopaedic Surgery

## 2021-03-18 MED ORDER — PREDNISONE 10 MG (21) PO TBPK
ORAL_TABLET | ORAL | 0 refills | Status: DC
  Filled 2021-03-18: qty 21, 6d supply, fill #0

## 2021-03-29 ENCOUNTER — Ambulatory Visit: Payer: 59 | Admitting: Family

## 2021-03-29 NOTE — Progress Notes (Incomplete)
Subjective:   By signing my name below, I, Shehryar Baig, attest that this documentation has been prepared under the direction and in the presence of Debbrah Alar NP. 03/29/2021     Patient ID: Denise Austin, female    DOB: [birth year], 50 y.o.   MRN: [id number]  No chief complaint on file.   HPI Patient is in today for a 2 week follow up office visit.   Health Maintenance Due  Topic Date Due  . COVID-19 Vaccine (1) Never done  . Zoster Vaccines- Shingrix (1 of 2) Never done    Past Medical History:  Diagnosis Date  . Allergy   . GERD (gastroesophageal reflux disease)   . Hypertension   . OSA (obstructive sleep apnea) 04/15/2018  . PONV (postoperative nausea and vomiting)   . Sleep apnea    wears cpap   . Uterine fibroid     Past Surgical History:  Procedure Laterality Date  . BUNIONECTOMY    . CHOLECYSTECTOMY    . LUMBAR LAMINECTOMY/DECOMPRESSION MICRODISCECTOMY Right 04/22/2017   Procedure: Extraforaminal Microdiscectomy - Lumbar two-Lumbar three - right;  Surgeon: Eustace Moore, MD;  Location: [facility];  Service: Neurosurgery;  Laterality: Right;  . lumbar surgery revision  04/24/2017  . WOUND EXPLORATION N/A 05/22/2017   Procedure: Revision Of Lumbar Wound;  Surgeon: Eustace Moore, MD;  Location: [facility];  Service: Neurosurgery;  Laterality: N/A;  Lumbar wound revision    Family History  Problem Relation Age of Onset  . Hypertension Mother   . Diabetes Mother   . Hypertension Father        smoker  . COPD Father   . Single kidney Sister        removed as a child  . Heart attack Neg Hx   . Hyperlipidemia Neg Hx   . Sudden death Neg Hx   . Colon cancer Neg Hx   . Colon polyps Neg Hx   . Esophageal cancer Neg Hx   . Stomach cancer Neg Hx   . Rectal cancer Neg Hx     Social History   Socioeconomic History  . Marital status: Single    Spouse name: Not on file  . Number of children: Not on file  . Years of education: Not on file  . Highest  education level: Not on file  Occupational History  . Not on file  Tobacco Use  . Smoking status: Never Smoker  . Smokeless tobacco: Never Used  Vaping Use  . Vaping Use: Never used  Substance and Sexual Activity  . Alcohol use: Yes    Alcohol/week: 0.0 standard drinks    Comment: occasional 1-2 times a month  . Drug use: No  . Sexual activity: Yes    Partners: Male    Birth control/protection: None  Other Topics Concern  . 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 BIOTIN PO Take 1 tablet by mouth daily. Gummy vitamin    . gabapentin (NEURONTIN) 300 MG capsule TAKE 1 CAPSULE BY MOUTH 2 TIMES DAILY. 60 capsule 2  . losartan (COZAAR) 50 MG tablet Take 1 tablet (50 mg total) by mouth daily. 30 tablet 0  . metoprolol succinate (TOPROL-XL) 25 MG 24 hr tablet Take 2 tablets (50 mg total) by mouth daily. 90 tablet 1  . omeprazole (PRILOSEC) 40 MG capsule TAKE 1 CAPSULE (40 MG TOTAL) BY MOUTH DAILY. 90 capsule 1  . predniSONE (STERAPRED UNI-PAK 21 TAB) 10 MG (21) TBPK tablet Take as directed 21 tablet 0  . valACYclovir (VALTREX) 500 MG tablet TAKE 1 TABLET (500 MG TOTAL) BY MOUTH DAILY. 90 tablet 1   No facility-administered medications prior to visit.    Allergies  Allergen Reactions  . Oxycodone-Acetaminophen Nausea And Vomiting and Rash    PT STATES SHE CAN TOLERATE    ROS      Objective:    Physical Exam Constitutional:      General: She is not in acute distress.    Appearance: She is not ill-appearing.  HENT:     Head: Normocephalic and atraumatic.     Right Ear: External ear normal.     Left Ear: External ear normal.  Eyes:     Extraocular Movements: Extraocular movements intact.     Pupils: Pupils are equal, round, and reactive to light.  Cardiovascular:     Rate and Rhythm: Normal rate and regular rhythm.     Pulses: Normal pulses.     Heart sounds: Normal heart sounds. No murmur heard. No gallop.   Pulmonary:     Effort: Pulmonary effort is normal. No respiratory distress.     Breath sounds: Normal breath sounds. No wheezing, rhonchi or rales.  Skin:    General: Skin is warm and dry.  Neurological:     Mental Status: She is alert and oriented to person, place, and time.  Psychiatric:        Behavior: Behavior normal.     LMP 04/02/2018  Wt Readings from Last 3 Encounters:  03/15/21 254 lb (115.2 kg)  01/25/21 257 lb (116.6 kg)  12/24/20 252 lb (114.3 kg)       Assessment & Plan:   Problem List Items Addressed This Visit   None      No orders of the defined types were placed in this encounter.   I, Debbrah Alar NP, personally preformed the services described in this documentation.  All medical record entries made by the scribe were at my direction and in my presence.  I have reviewed the chart and discharge instructions (if applicable) and agree that the record reflects my personal performance and is accurate and complete. 03/29/2021   I,Shehryar Baig,acting as a Education administrator for Nance Pear, NP.,have documented all relevant documentation on the behalf of Nance Pear, NP,as directed by  Nance Pear, NP while in the presence of Nance Pear, NP.   Shehryar [employer]

## 2021-04-02 ENCOUNTER — Other Ambulatory Visit (HOSPITAL_COMMUNITY): Payer: Self-pay

## 2021-04-02 MED FILL — Omeprazole Cap Delayed Release 40 MG: ORAL | 90 days supply | Qty: 90 | Fill #0 | Status: AC

## 2021-04-12 ENCOUNTER — Ambulatory Visit: Payer: 59 | Admitting: Family

## 2021-04-12 ENCOUNTER — Other Ambulatory Visit (HOSPITAL_COMMUNITY): Payer: Self-pay

## 2021-04-12 ENCOUNTER — Other Ambulatory Visit: Payer: Self-pay

## 2021-04-12 DIAGNOSIS — I10 Essential (primary) hypertension: Secondary | ICD-10-CM | POA: Diagnosis not present

## 2021-04-12 MED ORDER — METOPROLOL SUCCINATE ER 25 MG PO TB24: 75.0000 mg | ORAL_TABLET | Freq: Every day | ORAL | 1 refills | Status: DC

## 2021-04-12 MED ORDER — LOSARTAN POTASSIUM 50 MG PO TABS
50.0000 mg | ORAL_TABLET | Freq: Every day | ORAL | 1 refills | Status: DC
  Filled 2021-04-12 – 2021-04-23 (×2): qty 90, 90d supply, fill #0
  Filled 2021-08-02: qty 90, 90d supply, fill #1

## 2021-04-12 NOTE — Progress Notes (Addendum)
Subjective:   By signing my name below, I, Nance Pear, NP, attest that this documentation has been prepared under the direction and in the presence of @ENCPROV @. 04/12/2021     Patient ID: Denise Austin, female    DOB: [birth year], 50 y.o.   MRN: [id number]  Chief Complaint  Patient presents with   Hypertension    Here for follow up    HPI Patient is in today for follow up of her blood pressure. Last visit she noted LE edema and wished to stop amlodipine. Amlodipine was d/c'd and losartan 50mg  was added.  She notes resolution of LE edema, however she has had mild intermittent headaches which are new.   Health Maintenance Due  Topic Date Due   COVID-19 Vaccine (1) Never done   Zoster Vaccines- Shingrix (1 of 2) Never done    Past Medical History:  Diagnosis Date   Allergy    GERD (gastroesophageal reflux disease)    Hypertension    OSA (obstructive sleep apnea) 04/15/2018   PONV (postoperative nausea and vomiting)    Sleep apnea    wears cpap    Uterine fibroid     Past Surgical History:  Procedure Laterality Date   BUNIONECTOMY     CHOLECYSTECTOMY     LUMBAR LAMINECTOMY/DECOMPRESSION MICRODISCECTOMY Right 04/22/2017   Procedure: Extraforaminal Microdiscectomy - Lumbar two-Lumbar three - right;  Surgeon: Eustace Moore, MD;  Location: [facility];  Service: Neurosurgery;  Laterality: Right;   lumbar surgery revision  04/24/2017   WOUND EXPLORATION N/A 05/22/2017   Procedure: Revision Of Lumbar Wound;  Surgeon: Eustace Moore, MD;  Location: [facility];  Service: Neurosurgery;  Laterality: N/A;  Lumbar wound revision    Family History  Problem Relation Age of Onset   Hypertension Mother    Diabetes Mother    Hypertension Father        smoker   COPD Father    Single kidney Sister        removed as a child   Heart attack Neg Hx    Hyperlipidemia Neg Hx    Sudden death Neg Hx    Colon cancer Neg Hx    Colon polyps Neg Hx    Esophageal cancer Neg Hx    Stomach  cancer Neg Hx    Rectal cancer Neg Hx     Social History   Socioeconomic History   Marital status: Single    Spouse name: Not on file   Number of children: Not on file   Years of education: Not on file   Highest education level: Not on file  Occupational History   Not on file  Tobacco Use   Smoking status: Never   Smokeless tobacco: Never  Vaping Use   Vaping Use: Never used  Substance and Sexual Activity   Alcohol use: Yes    Alcohol/week: 0.0 standard drinks    Comment: occasional 1-2 times a month   Drug use: No   Sexual activity: Yes    Partners: Male    Birth control/protection: None  Other Topics Concern   Not on file  Social History Narrative   Has 2 children (2 boys)- one son in [place] and one is local   3 grandchildren 2 boys, 1 granddaughter   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 Gummy vitamin     gabapentin (NEURONTIN) 300 MG capsule TAKE 1 CAPSULE BY MOUTH 2 TIMES DAILY. 60 capsule 2   omeprazole (PRILOSEC) 40 MG capsule TAKE 1 CAPSULE (40 MG TOTAL) BY MOUTH DAILY. 90 capsule 1   predniSONE (STERAPRED UNI-PAK 21 TAB) 10 MG (21) TBPK tablet Take as directed 21 tablet 0   valACYclovir (VALTREX) 500 MG tablet TAKE 1 TABLET (500 MG TOTAL) BY MOUTH DAILY. 90 tablet 1   losartan (COZAAR) 50 MG tablet Take 1 tablet (50 mg total) by mouth daily. 30 tablet 0   metoprolol succinate (TOPROL-XL) 25 MG 24 hr tablet Take 2 tablets (50 mg total) by mouth daily. 90 tablet 1    No facility-administered medications prior to visit.    Allergies  Allergen Reactions   Oxycodone-Acetaminophen Nausea And Vomiting and Rash    PT STATES SHE CAN TOLERATE    ROS See HPI    Objective:    Physical Exam Constitutional:      Appearance: She is well-developed.  Neck:     Thyroid: No thyromegaly.  Cardiovascular:     Rate and Rhythm: Normal rate and regular rhythm.     Heart sounds: Normal heart sounds. No murmur heard. Pulmonary:     Effort: Pulmonary effort is normal. No respiratory distress.     Breath sounds: Normal breath sounds. No wheezing.  Musculoskeletal:     Cervical back: Neck supple.  Skin:    General: Skin is warm and dry.  Neurological:     Mental Status: She is alert and oriented to person, place, and time.  Psychiatric:        Behavior: Behavior normal.        Thought Content: Thought content normal.        Judgment: Judgment normal.    BP (!) 141/79 (BP Location: Right Arm, Patient Position: Sitting, Cuff Size: Large)   Pulse 71   Temp 98.3 F (36.8 C) (Oral)   Resp 16   Wt 257 lb (116.6 kg)   LMP 04/02/2018   SpO2 98%   BMI 42.77 kg/m  Wt Readings from Last 3 Encounters:  04/12/21 257 lb (116.6 kg)  03/15/21 254 lb (115.2 kg)  01/25/21 257 lb (116.6 kg)       Assessment & Plan:   Problem List Items Addressed This Visit       Unprioritized   HTN (hypertension)    BP Readings from Last 3 Encounters:  04/12/21 (!) 141/79  03/15/21 125/88  01/25/21 137/87  Edema is improved, but bp is a bit higher. Will increase toprol xl from 50mg  to 75 mg once daily. Check follow up bmet.        Relevant Medications   metoprolol succinate (TOPROL-XL) 25 MG 24 hr tablet   losartan (COZAAR) 50 MG tablet   Other Relevant Orders   Basic metabolic panel    I have changed [name]'s metoprolol succinate. I am also having her maintain her BIOTIN PO, valACYclovir, omeprazole, gabapentin, predniSONE, and losartan.  Meds  ordered this encounter  Medications   metoprolol succinate (TOPROL-XL) 25 MG 24  hr tablet    Sig: Take 3 tablets (75 mg total) by mouth daily.    Dispense:  90 tablet    Refill:  1    Order Specific Question:   Supervising Provider    Answer:   Penni Homans A [[id number]]   losartan (COZAAR) 50 MG tablet    Sig: Take 1 tablet (50 mg total) by mouth daily.    Dispense:  90 tablet    Refill:  1    Order Specific Question:   Supervising Provider    Answer:   Penni Homans A [[id number]]

## 2021-04-12 NOTE — Assessment & Plan Note (Addendum)
BP Readings from Last 3 Encounters:  04/12/21 (!) 141/79  03/15/21 125/88  01/25/21 137/87   Edema is improved, but bp is a bit higher. Will increase toprol xl from 50mg  to 75 mg once daily. Check follow up bmet.

## 2021-04-13 LAB — BASIC METABOLIC PANEL
BUN: 19 mg/dL (ref 7–25)
CO2: 29 mmol/L (ref 20–32)
Calcium: 9.6 mg/dL (ref 8.6–10.4)
Chloride: 105 mmol/L (ref 98–110)
Creat: 0.73 mg/dL (ref 0.50–1.05)
Glucose, Bld: 78 mg/dL (ref 65–99)
Potassium: 3.8 mmol/L (ref 3.5–5.3)
Sodium: 141 mmol/L (ref 135–146)

## 2021-04-22 ENCOUNTER — Other Ambulatory Visit (HOSPITAL_COMMUNITY): Payer: Self-pay

## 2021-04-23 ENCOUNTER — Other Ambulatory Visit (HOSPITAL_COMMUNITY): Payer: Self-pay

## 2021-05-14 ENCOUNTER — Other Ambulatory Visit (HOSPITAL_COMMUNITY): Payer: Self-pay

## 2021-06-03 ENCOUNTER — Other Ambulatory Visit (HOSPITAL_COMMUNITY): Payer: Self-pay

## 2021-06-03 ENCOUNTER — Other Ambulatory Visit: Payer: Self-pay | Admitting: Family

## 2021-06-03 MED ORDER — METOPROLOL SUCCINATE ER 25 MG PO TB24
75.0000 mg | ORAL_TABLET | Freq: Every day | ORAL | 1 refills | Status: DC
  Filled 2021-06-03: qty 90, 30d supply, fill #0
  Filled 2021-07-05: qty 90, 30d supply, fill #1

## 2021-06-03 MED FILL — Valacyclovir HCl Tab 500 MG: ORAL | 90 days supply | Qty: 90 | Fill #1 | Status: AC

## 2021-06-05 ENCOUNTER — Other Ambulatory Visit: Payer: Self-pay

## 2021-06-05 ENCOUNTER — Ambulatory Visit: Payer: Self-pay

## 2021-06-05 ENCOUNTER — Other Ambulatory Visit (HOSPITAL_COMMUNITY): Payer: Self-pay

## 2021-06-05 ENCOUNTER — Ambulatory Visit (INDEPENDENT_AMBULATORY_CARE_PROVIDER_SITE_OTHER): Payer: 59 | Admitting: Family Medicine

## 2021-06-05 DIAGNOSIS — M25551 Pain in right hip: Secondary | ICD-10-CM | POA: Diagnosis not present

## 2021-06-05 MED ORDER — HYDROCODONE-ACETAMINOPHEN 5-325 MG PO TABS
1.0000 | ORAL_TABLET | Freq: Four times a day (QID) | ORAL | 0 refills | Status: DC | PRN
  Filled 2021-06-05: qty 12, 3d supply, fill #0

## 2021-06-05 NOTE — Progress Notes (Signed)
Office Visit Note   Patient: Denise Austin           Date of Birth: [birth year]           MRN: [id number] Visit Date: 06/05/2021 Requested by: Debbrah Alar, NP [address] [place],  [place] [address] PCP: Debbrah Alar, NP  Subjective: Chief Complaint  Patient presents with   Right Hip - Pain    Pain in the right lateral thigh -- has been putting up with it for a little bit. Some numbness in around the knee. Only a little pain in the groin. No posterior hip/buttock pain.     HPI: She is here with right lateral hip pain.  Last injection was about a year and a half ago.  It started hurting a couple months ago.  Pain on the lateral aspect, no significant groin pain.                ROS:   All other systems were reviewed and are negative.  Objective: Vital Signs: LMP 04/02/2018   Physical Exam:  General:  Alert and oriented, in no acute distress. Pulm:  Breathing unlabored. Psy:  Normal mood, congruent affect.  Right hip: No significant groin pain with passive internal rotation.  She is tender over the greater trochanter and this reproduces her pain.    Imaging: US Guided Needle Placement  Result Date: 06/05/2021 Ultrasound-guided right hip injection: After sterile prep with Betadine, injected 5 cc 1% lidocaine without epinephrine and 40 mg Depo-Medrol into the gluteus medius tendon attachment on the greater trochanter.  Ultrasound was used to ensure proper placement given patient's body habitus.   Assessment & Plan: Right hip greater trochanter syndrome -Injection given as above.  Follow-up as needed.     Procedures: No procedures performed        PMFS History: Patient Active Problem List   Diagnosis Date Noted   Morbid obesity due to excess calories ([place]) 04/21/2019   OSA (obstructive sleep apnea) 04/15/2018   Herpes simplex viral infection 07/10/2017   S/P lumbar laminectomy 04/22/2017   GERD (gastroesophageal reflux disease)  07/25/2016   Hyperglycemia 07/25/2016   HTN (hypertension) 12/24/2015   Preventative health care 10/16/2014   [place] arthritis 07/06/2014   S/P excision of ganglion cyst 07/06/2014   Synovitis 07/06/2014   Past Medical History:  Diagnosis Date   Allergy    GERD (gastroesophageal reflux disease)    Hypertension    OSA (obstructive sleep apnea) 04/15/2018   PONV (postoperative nausea and vomiting)    Sleep apnea    wears cpap    Uterine fibroid     Family History  Problem Relation Age of Onset   Hypertension Mother    Diabetes Mother    Hypertension Father        smoker   COPD Father    Single kidney Sister        removed as a child   Heart attack Neg Hx    Hyperlipidemia Neg Hx    Sudden death Neg Hx    Colon cancer Neg Hx    Colon polyps Neg Hx    Esophageal cancer Neg Hx    Stomach cancer Neg Hx    Rectal cancer Neg Hx     Past Surgical History:  Procedure Laterality Date   BUNIONECTOMY     CHOLECYSTECTOMY     LUMBAR LAMINECTOMY/DECOMPRESSION MICRODISCECTOMY Right 04/22/2017   Procedure: Extraforaminal Microdiscectomy - Lumbar two-Lumbar three - right;  Surgeon: Eustace Moore, MD;  Location: [facility];  Service: Neurosurgery;  Laterality: Right;   lumbar surgery revision  04/24/2017   WOUND EXPLORATION N/A 05/22/2017   Procedure: Revision Of Lumbar Wound;  Surgeon: Eustace Moore, MD;  Location: [facility];  Service: Neurosurgery;  Laterality: N/A;  Lumbar wound revision   Social History   Occupational History   Not on file  Tobacco Use   Smoking status: Never   Smokeless tobacco: Never  Vaping Use   Vaping Use: Never used  Substance and Sexual Activity   Alcohol use: Yes    Alcohol/week: 0.0 standard drinks    Comment: occasional 1-2 times a month   Drug use: No   Sexual activity: Yes    Partners: Male    Birth control/protection: None

## 2021-06-25 ENCOUNTER — Other Ambulatory Visit (INDEPENDENT_AMBULATORY_CARE_PROVIDER_SITE_OTHER): Payer: Self-pay | Admitting: Family Medicine

## 2021-06-25 ENCOUNTER — Other Ambulatory Visit (HOSPITAL_COMMUNITY): Payer: Self-pay

## 2021-06-25 MED ORDER — GABAPENTIN 300 MG PO CAPS
300.0000 mg | ORAL_CAPSULE | Freq: Two times a day (BID) | ORAL | 2 refills | Status: DC
Start: 2021-06-25 — End: 2021-12-09
  Filled 2021-06-25: qty 60, 30d supply, fill #0
  Filled 2021-08-05: qty 60, 30d supply, fill #1
  Filled 2021-10-04: qty 60, 30d supply, fill #2

## 2021-07-02 ENCOUNTER — Institutional Professional Consult (permissible substitution): Payer: 59 | Admitting: Plastic Surgery

## 2021-07-05 ENCOUNTER — Other Ambulatory Visit (HOSPITAL_COMMUNITY): Payer: Self-pay

## 2021-07-05 MED FILL — Omeprazole Cap Delayed Release 40 MG: ORAL | 90 days supply | Qty: 90 | Fill #1 | Status: AC

## 2021-07-29 ENCOUNTER — Telehealth: Payer: Self-pay

## 2021-07-29 MED ORDER — PREDNISONE 10 MG (21) PO TBPK
ORAL_TABLET | ORAL | 0 refills | Status: DC
  Filled 2021-07-29: qty 21, 6d supply, fill #0

## 2021-07-29 MED ORDER — CYCLOBENZAPRINE HCL 5 MG PO TABS
5.0000 mg | ORAL_TABLET | Freq: Three times a day (TID) | ORAL | 3 refills | Status: DC | PRN
  Filled 2021-07-29: qty 30, 15d supply, fill #0

## 2021-07-29 NOTE — Addendum Note (Signed)
Addended by: Azucena Cecil on: 07/29/2021 08:20 PM   Modules accepted: Orders

## 2021-07-29 NOTE — Telephone Encounter (Signed)
I sent flexeril and pred dose pak

## 2021-07-29 NOTE — Telephone Encounter (Signed)
Patient would like Prednisone but would like more than 10 mg  pack. States prednisone 10 mg pack does not help. Please advise.

## 2021-07-30 ENCOUNTER — Other Ambulatory Visit (HOSPITAL_COMMUNITY): Payer: Self-pay

## 2021-07-30 NOTE — Telephone Encounter (Signed)
Patient aware.

## 2021-08-02 ENCOUNTER — Encounter: Payer: 59 | Admitting: Family

## 2021-08-02 ENCOUNTER — Other Ambulatory Visit (HOSPITAL_COMMUNITY): Payer: Self-pay

## 2021-08-02 NOTE — Progress Notes (Incomplete)
Subjective:   By signing my name below, I, [name], attest that this documentation has been prepared under the direction and in the presence of Debbrah Alar, NP, 08/02/2021   Patient ID: Denise Austin, female    DOB: [birth year], 50 y.o.   MRN: [id number]  No chief complaint on file.   HPI Patient is in today for a comprehensive physical exam.    She denies any fever, unexpected weight change, adenopathy, rash, hearing loss, ear pain, rhinorrhea, visual disturbances, eye pain, chest pain, leg swelling, cough, nausea, vomitting, diarrhea, blood in stool, dysuria, frequency, myalgias, arthralgias, headaches, depression or anxiety.  Immunizations:  Diet: Exercise: Colonoscopy: Last performed on 12/24/2020 and results found Two sessile polyps that were 6-8 mm were found in the proximal transverse colon and distal ascending colon along with small non bleeding internal hemorrhoids. The rest of the exam findings were normal. Repeat in 10 years.  Pap Smear: Last performed on 06/25/2020 and found trichomonas vaginalis present. All other findings were normal. Repeat in one year. Due.  Mammogram: Last performed on 07/03/2020 and all findings were normal. Repeat in 1 year. Due.  Shx:  FMHx: Alcohol: Drugs: Tobacco: Sexual health:  Family: Work: Dental: Vision:    Health Maintenance Due  Topic Date Due   COVID-19 Vaccine (1) Never done   Zoster Vaccines- Shingrix (1 of 2) Never done   INFLUENZA VACCINE  05/27/2021   MAMMOGRAM  06/29/2021    Past Medical History:  Diagnosis Date   Allergy    GERD (gastroesophageal reflux disease)    Hypertension    OSA (obstructive sleep apnea) 04/15/2018   PONV (postoperative nausea and vomiting)    Sleep apnea    wears cpap    Uterine fibroid     Past Surgical History:  Procedure Laterality Date   BUNIONECTOMY     CHOLECYSTECTOMY     LUMBAR LAMINECTOMY/DECOMPRESSION MICRODISCECTOMY Right 04/22/2017   Procedure:  Extraforaminal Microdiscectomy - Lumbar two-Lumbar three - right;  Surgeon: Eustace Moore, MD;  Location: [facility];  Service: Neurosurgery;  Laterality: Right;   lumbar surgery revision  04/24/2017   WOUND EXPLORATION N/A 05/22/2017   Procedure: Revision Of Lumbar Wound;  Surgeon: Eustace Moore, MD;  Location: [facility];  Service: Neurosurgery;  Laterality: N/A;  Lumbar wound revision    Family History  Problem Relation Age of Onset   Hypertension Mother    Diabetes Mother    Hypertension Father        smoker   COPD Father    Single kidney Sister        removed as a child   Heart attack Neg Hx    Hyperlipidemia Neg Hx    Sudden death Neg Hx    Colon cancer Neg Hx    Colon polyps Neg Hx    Esophageal cancer Neg Hx    Stomach cancer Neg Hx    Rectal cancer Neg Hx     Social History   Socioeconomic History   Marital status: Single    Spouse name: Not on file   Number of children: Not on file   Years of education: Not on file   Highest education level: Not on file  Occupational History   Not on file  Tobacco Use   Smoking status: Never   Smokeless tobacco: Never  Vaping Use   Vaping Use: Never used  Substance and Sexual Activity   Alcohol use: Yes    Alcohol/week: 0.0 standard drinks  Comment: occasional 1-2 times a month   Drug use: No   Sexual activity: Yes    Partners: Male    Birth control/protection: None  Other Topics Concern   Not on file  Social History Narrative   Has 2 children (2 boys)- one son in [place] and one is local   3 grandchildren 2 boys, 1 granddaughter   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 Gummy vitamin     cyclobenzaprine (FLEXERIL) 5 MG tablet Take 1-2 tablets (5-10 mg total) by mouth 3 (three) times daily as needed for muscle spasms. 30 tablet 3   gabapentin (NEURONTIN) 300 MG capsule TAKE 1 CAPSULE BY MOUTH 2 TIMES DAILY. 60 capsule 2   HYDROcodone-acetaminophen (NORCO/VICODIN) 5-325 MG tablet Take 1 tablet by mouth every 6 (six) hours as needed for moderate pain. 12 tablet 0   losartan (COZAAR) 50 MG tablet Take 1 tablet (50 mg total) by mouth daily. 90 tablet 1   metoprolol succinate (TOPROL-XL) 25 MG 24 hr tablet Take 3 tablets (75 mg total) by mouth daily. 90 tablet 1   omeprazole (PRILOSEC) 40 MG capsule TAKE 1 CAPSULE (40 MG TOTAL) BY MOUTH DAILY. 90 capsule 1   predniSONE (STERAPRED UNI-PAK 21 TAB) 10 MG (21) TBPK tablet Take as directed 21 tablet 0   valACYclovir (VALTREX) 500 MG tablet TAKE 1 TABLET (500 MG TOTAL) BY MOUTH DAILY. 90 tablet 1   No facility-administered medications prior to visit.    Allergies  Allergen Reactions   Oxycodone-Acetaminophen Nausea And Vomiting and Rash    PT STATES SHE CAN TOLERATE    Review of Systems  Constitutional:  Negative for fever.       (-) unexpected weight changes  (-) adenopathy  HENT:  Negative for ear pain and hearing loss.        (-) rhinorrhea   Eyes:  Negative for pain.       (-) visual disturbances  Respiratory:  Negative for cough.   Cardiovascular:  Negative for chest pain and leg swelling.  Gastrointestinal:  Negative for blood in stool, diarrhea, nausea and vomiting.  Genitourinary:  Negative for dysuria and frequency.  Musculoskeletal:  Negative for joint pain and myalgias.  Skin:  Negative for rash.  Neurological:  Negative for headaches.  Psychiatric/Behavioral:  Negative for depression. The patient is not nervous/anxious.       Objective:     Physical Exam Constitutional:      General: She is not in acute distress.    Appearance: Normal appearance. She is not ill-appearing.  HENT:     Head: Normocephalic and atraumatic.     Right Ear: Tympanic membrane, ear canal and external ear normal.     Left Ear: Tympanic membrane, ear canal and external ear normal.  Eyes:     Extraocular Movements: Extraocular movements intact.     Pupils: Pupils are equal, round, and reactive to light.  Comments: (-) nystagmus  Cardiovascular:     Rate and Rhythm: Normal rate and regular rhythm.     Heart sounds: Normal heart sounds. No murmur heard.   No gallop.  Pulmonary:     Effort: Pulmonary effort is normal. No respiratory distress.     Breath sounds: Normal breath sounds. No wheezing or rales.  Musculoskeletal:     Comments: (5/5) strength in upper and lower extremities   Lymphadenopathy:     Cervical: No cervical adenopathy.  Skin:    General: Skin is warm and dry.  Neurological:     Mental Status: She is alert and oriented to person, place, and time.     Deep Tendon Reflexes:     Reflex Scores:      Patellar reflexes are 2+ on the right side and 2+ on the left side. Psychiatric:        Behavior: Behavior normal.        Judgment: Judgment normal.    LMP 04/02/2018  Wt Readings from Last 3 Encounters:  04/12/21 257 lb (116.6 kg)  03/15/21 254 lb (115.2 kg)  01/25/21 257 lb (116.6 kg)       Assessment & Plan:   Problem List Items Addressed This Visit   None  No orders of the defined types were placed in this encounter.   I, Debbrah Alar, NP, personally preformed the services described in this documentation.  All medical record entries made by the scribe were at my direction and in my presence.  I have reviewed the chart and discharge instructions (if applicable) and agree that the record reflects my personal performance and is accurate and complete. 08/02/2021  I,[name],acting as a Education administrator for  Nance Pear, NP.,have documented all relevant documentation on the behalf of Nance Pear, NP,as directed by  Nance Pear, NP while in the presence of Nance Pear, NP.  [name]

## 2021-08-05 ENCOUNTER — Other Ambulatory Visit (HOSPITAL_COMMUNITY): Payer: Self-pay

## 2021-08-13 ENCOUNTER — Other Ambulatory Visit: Payer: Self-pay | Admitting: Family

## 2021-08-13 ENCOUNTER — Other Ambulatory Visit (HOSPITAL_BASED_OUTPATIENT_CLINIC_OR_DEPARTMENT_OTHER): Payer: Self-pay | Admitting: Family

## 2021-08-13 ENCOUNTER — Encounter: Payer: 59 | Admitting: Family

## 2021-08-13 DIAGNOSIS — Z1231 Encounter for screening mammogram for malignant neoplasm of breast: Secondary | ICD-10-CM

## 2021-08-13 NOTE — Progress Notes (Incomplete)
Subjective:   By signing my name below, I, Denise Austin, attest that this documentation has been prepared under the direction and in the presence of Debbrah Alar, NP, 08/13/2021   Patient ID: Denise Austin, female    DOB: Mar 19, 1971, 50 y.o.   MRN: [id number]  No chief complaint on file.   HPI Patient is in today for a comprehensive physical exam.   She denies any fever, unexpected weight change, adenopathy, rash, hearing loss, ear pain, rhinorrhea, visual disturbances, eye pain, chest pain, leg swelling, cough, nausea, vomitting, diarrhea, blood in stool, dysuria, frequency, myalgias, arthralgias, headaches, depression or anxiety.  Immunizations: Diet: Exercise: Colonoscopy: Last performed on 12/24/2020 and results found two 6-8 mm sessile polyps in the proximal transverse colon and distal ascending colon. Small non-bleeding hemorrhoids were also found during retroflexion. Other findings were otherwise normal. Repeat in 10 years. Pap Smear: Last performed on 06/25/2020 and results found trichomonas vaginalis present otherwise all findings were normal. Repeat every 3 years . Mammogram: Last performed on 06/29/2020 and results were normal. Repeat every year. Due.  Vision: Dental:  Shx: FMHx: Work:  Family:   Health Maintenance Due  Topic Date Due   COVID-19 Vaccine (1) Never done   Zoster Vaccines- Shingrix (1 of 2) Never done   MAMMOGRAM  06/29/2021    Past Medical History:  Diagnosis Date   Allergy    GERD (gastroesophageal reflux disease)    Hypertension    OSA (obstructive sleep apnea) 04/15/2018   PONV (postoperative nausea and vomiting)    Sleep apnea    wears cpap    Uterine fibroid     Past Surgical History:  Procedure Laterality Date   BUNIONECTOMY     CHOLECYSTECTOMY     LUMBAR LAMINECTOMY/DECOMPRESSION MICRODISCECTOMY Right 04/22/2017   Procedure: Extraforaminal Microdiscectomy - Lumbar two-Lumbar three - right;  Surgeon: Eustace Moore,  MD;  Location: [facility];  Service: Neurosurgery;  Laterality: Right;   lumbar surgery revision  04/24/2017   WOUND EXPLORATION N/A 05/22/2017   Procedure: Revision Of Lumbar Wound;  Surgeon: Eustace Moore, MD;  Location: [facility];  Service: Neurosurgery;  Laterality: N/A;  Lumbar wound revision    Family History  Problem Relation Age of Onset   Hypertension Mother    Diabetes Mother    Hypertension Father        smoker   COPD Father    Single kidney Sister        removed as a child   Heart attack Neg Hx    Hyperlipidemia Neg Hx    Sudden death Neg Hx    Colon cancer Neg Hx    Colon polyps Neg Hx    Esophageal cancer Neg Hx    Stomach cancer Neg Hx    Rectal cancer Neg Hx     Social History   Socioeconomic History   Marital status: Single    Spouse name: Not on file   Number of children: Not on file   Years of education: Not on file   Highest education level: Not on file  Occupational History   Not on file  Tobacco Use   Smoking status: Never   Smokeless tobacco: Never  Vaping Use   Vaping Use: Never used  Substance and Sexual Activity   Alcohol use: Yes    Alcohol/week: 0.0 standard drinks    Comment: occasional 1-2 times a month   Drug use: No   Sexual activity: Yes    Partners: Male  Birth control/protection: None  Other Topics Concern   Not on file  Social History Narrative   Has 2 children (2 boys)- one son in [place] and one is local   3 grandchildren 2 boys, 1 granddaughter   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 Gummy vitamin     cyclobenzaprine (FLEXERIL) 5 MG tablet Take 1-2 tablets (5-10 mg total) by mouth 3 (three) times daily as needed for muscle spasms. 30 tablet 3   gabapentin (NEURONTIN) 300 MG capsule TAKE 1 CAPSULE BY MOUTH 2 TIMES DAILY. 60 capsule 2   HYDROcodone-acetaminophen (NORCO/VICODIN) 5-325 MG tablet Take 1 tablet by mouth every 6 (six) hours as needed for moderate pain. 12 tablet 0   losartan (COZAAR) 50 MG tablet Take 1 tablet (50 mg total) by mouth daily. 90 tablet 1   metoprolol succinate (TOPROL-XL) 25 MG 24 hr tablet Take 3 tablets (75 mg total) by mouth daily. 90 tablet 1   omeprazole (PRILOSEC) 40 MG capsule TAKE 1 CAPSULE (40 MG TOTAL) BY MOUTH DAILY. 90 capsule 1   predniSONE (STERAPRED UNI-PAK 21 TAB) 10 MG (21) TBPK tablet Take as directed 21 tablet 0   valACYclovir (VALTREX) 500 MG tablet TAKE 1 TABLET (500 MG TOTAL) BY MOUTH DAILY. 90 tablet 1   No facility-administered medications prior to visit.    Allergies  Allergen Reactions   Oxycodone-Acetaminophen Nausea And Vomiting and Rash    PT STATES SHE CAN TOLERATE    Review of Systems  Constitutional:  Negative for fever.       (-) unexpected weight changes (-) adenopathy  HENT:  Negative for ear pain and hearing loss.        (-) rhinorrhea  Eyes:  Negative for pain.       (-) visual disturbances   Respiratory:  Negative for cough.   Cardiovascular:  Negative for chest pain and leg swelling.  Gastrointestinal:  Negative for blood in stool, diarrhea, nausea and vomiting.  Genitourinary:  Negative for dysuria and frequency.  Musculoskeletal:  Negative for joint pain and myalgias.  Skin:  Negative for rash.  Neurological:  Negative for headaches.  Psychiatric/Behavioral:  Negative for depression. The patient is not nervous/anxious.       Objective:    Physical Exam Constitutional:      General: She is not in acute distress.    Appearance: Normal  appearance. She is not ill-appearing.  HENT:     Head: Normocephalic and atraumatic.     Right Ear: Tympanic membrane, ear canal and external ear normal.     Left Ear: Tympanic membrane, ear canal and external ear normal.  Eyes:     Extraocular Movements: Extraocular movements intact.     Pupils: Pupils are equal, round, and reactive to light.     Comments: (-) nystagmus  Cardiovascular:     Rate and Rhythm: Normal rate and regular rhythm.     Heart sounds: Normal heart  sounds. No murmur heard.   No gallop.  Pulmonary:     Effort: Pulmonary effort is normal. No respiratory distress.     Breath sounds: Normal breath sounds. No wheezing or rales.  Musculoskeletal:     Comments: (+) 5/5 upper and lower extremity strength  Lymphadenopathy:     Cervical: No cervical adenopathy.  Skin:    General: Skin is warm and dry.  Neurological:     Mental Status: She is alert and oriented to person, place, and time.  Psychiatric:        Behavior: Behavior normal.        Judgment: Judgment normal.    LMP 04/02/2018  Wt Readings from Last 3 Encounters:  04/12/21 257 lb (116.6 kg)  03/15/21 254 lb (115.2 kg)  01/25/21 257 lb (116.6 kg)       Assessment & Plan:   Problem List Items Addressed This Visit   None  No orders of the defined types were placed in this encounter.  I, Debbrah Alar, NP, personally preformed the services described in this documentation.  All medical record entries made by the scribe were at my direction and in my presence.  I have reviewed the chart and discharge instructions (if applicable) and agree that the record reflects my personal performance and is accurate and complete. 08/13/2021  I,Denise Austin,acting as a Education administrator for Nance Pear, NP.,have documented all relevant documentation on the behalf of Nance Pear, NP,as directed by  Nance Pear, NP while in the presence of Nance Pear, NP.  Denise Austin

## 2021-08-14 ENCOUNTER — Ambulatory Visit (HOSPITAL_BASED_OUTPATIENT_CLINIC_OR_DEPARTMENT_OTHER): Payer: 59 | Admitting: Radiology

## 2021-08-22 ENCOUNTER — Other Ambulatory Visit (HOSPITAL_COMMUNITY): Payer: Self-pay

## 2021-08-22 ENCOUNTER — Other Ambulatory Visit: Payer: Self-pay | Admitting: Family

## 2021-08-22 MED ORDER — VALACYCLOVIR HCL 500 MG PO TABS
500.0000 mg | ORAL_TABLET | Freq: Every day | ORAL | 0 refills | Status: DC
  Filled 2021-08-22: qty 90, 90d supply, fill #0

## 2021-08-22 MED ORDER — METOPROLOL SUCCINATE ER 25 MG PO TB24
75.0000 mg | ORAL_TABLET | Freq: Every day | ORAL | 0 refills | Status: DC
  Filled 2021-08-22: qty 270, 90d supply, fill #0

## 2021-09-12 ENCOUNTER — Other Ambulatory Visit: Payer: Self-pay

## 2021-09-13 ENCOUNTER — Encounter: Payer: 59 | Admitting: Family

## 2021-09-13 ENCOUNTER — Other Ambulatory Visit (HOSPITAL_COMMUNITY): Payer: Self-pay

## 2021-09-13 ENCOUNTER — Other Ambulatory Visit: Payer: Self-pay

## 2021-09-13 ENCOUNTER — Ambulatory Visit (INDEPENDENT_AMBULATORY_CARE_PROVIDER_SITE_OTHER): Payer: 59 | Admitting: Family

## 2021-09-13 VITALS — BP 132/92 | HR 71 | Temp 98.6°F | Resp 16 | Ht 65.0 in | Wt 258.0 lb

## 2021-09-13 DIAGNOSIS — Z0000 Encounter for general adult medical examination without abnormal findings: Secondary | ICD-10-CM | POA: Diagnosis not present

## 2021-09-13 DIAGNOSIS — R739 Hyperglycemia, unspecified: Secondary | ICD-10-CM | POA: Diagnosis not present

## 2021-09-13 DIAGNOSIS — I10 Essential (primary) hypertension: Secondary | ICD-10-CM

## 2021-09-13 MED ORDER — METOPROLOL SUCCINATE ER 25 MG PO TB24
75.0000 mg | ORAL_TABLET | Freq: Every day | ORAL | 0 refills | Status: DC
  Filled 2021-09-13 – 2021-11-28 (×2): qty 270, 90d supply, fill #0

## 2021-09-13 MED ORDER — OMEPRAZOLE 40 MG PO CPDR
40.0000 mg | DELAYED_RELEASE_CAPSULE | Freq: Every day | ORAL | 1 refills | Status: DC | PRN
  Filled 2021-09-13 – 2021-10-04 (×2): qty 90, 90d supply, fill #0
  Filled 2022-01-06: qty 90, 90d supply, fill #1

## 2021-09-13 MED ORDER — LOSARTAN POTASSIUM 50 MG PO TABS
50.0000 mg | ORAL_TABLET | Freq: Every day | ORAL | 1 refills | Status: DC
  Filled 2021-09-13 – 2021-11-11 (×2): qty 90, 90d supply, fill #0
  Filled 2022-02-26: qty 90, 90d supply, fill #1

## 2021-09-13 MED ORDER — VALACYCLOVIR HCL 500 MG PO TABS
500.0000 mg | ORAL_TABLET | Freq: Every day | ORAL | 0 refills | Status: DC
  Filled 2021-09-13 – 2021-12-27 (×3): qty 90, 90d supply, fill #0

## 2021-09-13 NOTE — Patient Instructions (Signed)
Add more lean proteins ([place], fish, chicken), lots of non-starchy veggies (salad makings), roasted cauliflower, broccoli, green bean, asparagus.  Small amounts of whole grains- oatmeal, whole grain pasta/bread.   Limit white starches.

## 2021-09-13 NOTE — Assessment & Plan Note (Addendum)
BP Readings from Last 3 Encounters:  09/13/21 (!) 132/92  04/12/21 (!) 141/79  03/15/21 125/88   DBP mildly elevated.  SBP at goal. Will continue toprol xl and losartan/weight loss efforts.

## 2021-09-13 NOTE — Assessment & Plan Note (Addendum)
Discussed healthy diet, exercise and weight loss. Pap up to date. Declines covid vaccine. Flu shot up to date.

## 2021-09-13 NOTE — Progress Notes (Signed)
Subjective:     Patient ID: Denise Austin, female    DOB: [birth year], 50 y.o.   MRN: [id number]  Chief Complaint  Patient presents with   Annual Exam    HPI  Patient presents today for complete physical.  Immunizations: tdap 2015, flu 10/5, did not do any covid vaccines.  Diet: Diet needs improvement.  Wt Readings from Last 3 Encounters:  09/13/21 258 lb (117 kg)  04/12/21 257 lb (116.6 kg)  03/15/21 254 lb (115.2 kg)  Exercise: not regularly Colonoscopy: 2022 Pap Smear: 2021 Mammogram: scheduled Dental: up to date Vision: up to date   Health Maintenance Due  Topic Date Due   COVID-19 Vaccine (1) Never done   Zoster Vaccines- Shingrix (1 of 2) Never done   MAMMOGRAM  06/29/2021    Past Medical History:  Diagnosis Date   Allergy    GERD (gastroesophageal reflux disease)    Hypertension    OSA (obstructive sleep apnea) 04/15/2018   PONV (postoperative nausea and vomiting)    Sleep apnea    wears cpap    Uterine fibroid     Past Surgical History:  Procedure Laterality Date   BUNIONECTOMY     CHOLECYSTECTOMY     LUMBAR LAMINECTOMY/DECOMPRESSION MICRODISCECTOMY Right 04/22/2017   Procedure: Extraforaminal Microdiscectomy - Lumbar two-Lumbar three - right;  Surgeon: Tia Alert, MD;  Location: [facility] OR;  Service: Neurosurgery;  Laterality: Right;   lumbar surgery revision  04/24/2017   WOUND EXPLORATION N/A 05/22/2017   Procedure: Revision Of Lumbar Wound;  Surgeon: Tia Alert, MD;  Location: [facility] OR;  Service: Neurosurgery;  Laterality: N/A;  Lumbar wound revision    Family History  Problem Relation Age of Onset   Hypertension Mother    Diabetes Mother    Hypertension Father        smoker   COPD Father    Single kidney Sister        removed as a child   Heart attack Neg Hx    Hyperlipidemia Neg Hx    Sudden death Neg Hx    Colon cancer Neg Hx    Colon polyps Neg Hx    Esophageal cancer Neg Hx    Stomach cancer Neg Hx    Rectal cancer Neg Hx      Social History   Socioeconomic History   Marital status: Single    Spouse name: Not on file   Number of children: Not on file   Years of education: Not on file   Highest education level: Not on file  Occupational History   Not on file  Tobacco Use   Smoking status: Never   Smokeless tobacco: Never  Vaping Use   Vaping Use: Never used  Substance and Sexual Activity   Alcohol use: Yes    Alcohol/week: 0.0 standard drinks    Comment: occasional 1-2 times a month   Drug use: No   Sexual activity: Yes    Partners: Male    Birth control/protection: None  Other Topics Concern   Not on file  Social History Narrative   Has 2 children (2 boys)- one son in [place] and one is local   3 grandchildren 2 boys, 1 granddaughter   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 Gummy vitamin     gabapentin (NEURONTIN) 300 MG capsule TAKE 1 CAPSULE BY MOUTH 2 TIMES DAILY. 60 capsule 2   losartan (COZAAR) 50 MG tablet Take 1 tablet (50 mg total) by mouth daily. 90 tablet 1   metoprolol succinate (TOPROL-XL) 25 MG 24 hr tablet Take 3 tablets (75 mg total) by mouth daily. 270 tablet 0   omeprazole (PRILOSEC) 40 MG capsule TAKE 1 CAPSULE (40 MG TOTAL) BY MOUTH DAILY. 90 capsule 1   valACYclovir (VALTREX) 500 MG tablet Take 1 tablet (500 mg total) by mouth daily. 90 tablet 0   cyclobenzaprine (FLEXERIL) 5 MG tablet Take 1-2 tablets (5-10 mg total) by mouth 3 (three) times daily as needed for muscle spasms. 30  tablet 3   HYDROcodone-acetaminophen (NORCO/VICODIN) 5-325 MG tablet Take 1 tablet by mouth every 6 (six) hours as needed for moderate pain. 12 tablet 0   predniSONE (STERAPRED UNI-PAK 21 TAB) 10 MG (21) TBPK tablet Take as directed 21 tablet 0   No facility-administered medications prior to visit.    Allergies  Allergen Reactions   Oxycodone-Acetaminophen Nausea And Vomiting and Rash    PT STATES SHE CAN TOLERATE    Review of Systems  Constitutional:  Negative for weight loss.  HENT:  Negative for congestion.   Eyes:  Negative for blurred vision.  Respiratory:  Negative for cough and shortness of breath.   Cardiovascular:  Negative for palpitations.  Gastrointestinal:  Positive for constipation (some constipation).  Genitourinary:  Negative for dysuria, frequency and hematuria.  Musculoskeletal:  Negative for joint pain and myalgias.  Neurological:  Negative for headaches.  Psychiatric/Behavioral:         Denies depression/anxiety      Objective:    Physical Exam  BP (!) 132/92 (BP Location: Right Arm, Patient Position: Sitting, Cuff Size: Large)   Pulse 71   Temp 98.6 F (37 C) (Oral)   Resp 16   Ht $R'5\' 5"'Hj$  (1.651 m)   Wt 258 lb (117 kg)   LMP 04/02/2018   SpO2 98%   BMI 42.93 kg/m  Wt Readings from Last 3 Encounters:  09/13/21 258 lb (117 kg)  04/12/21 257 lb (116.6 kg)  03/15/21 254 lb (115.2 kg)   Physical Exam  Constitutional: She is oriented to person, place, and time. She appears well-developed and well-nourished. No distress.  HENT:  Head: Normocephalic and atraumatic.  Right Ear: Tympanic membrane and ear canal normal.  Left Ear: Tympanic membrane and ear canal normal.  Mouth/Throat: not examined- wearing mask Eyes: Pupils are equal, round, and reactive to light. No scleral icterus.  Neck: Normal range of motion. No thyromegaly present.  Cardiovascular: Normal rate and regular rhythm.   No murmur heard. Pulmonary/Chest: Effort normal and breath sounds  normal. No respiratory distress. He has no wheezes. She has no rales. She exhibits no tenderness.  Abdominal: Soft. Bowel sounds are normal. She exhibits no distension and no mass. There is no tenderness. There is no rebound and no guarding.  Musculoskeletal: She exhibits no edema.  Lymphadenopathy:    She has no cervical adenopathy.  Neurological: She is alert and oriented to person, place,  and time. She has normal patellar reflexes. She exhibits normal muscle tone. Coordination normal.  Skin: Skin is warm and dry.  Psychiatric: She has a normal mood and affect. Her behavior is normal. Judgment and thought content normal.  Breasts: Examined lying Right: Without masses, retractions, discharge or axillary adenopathy.  Left: Without masses, retractions, discharge or axillary adenopathy.  Pelvic:  deferred         Assessment & Plan:       Assessment & Plan:   Problem List Items Addressed This Visit       Unprioritized   Preventative health care    Discussed healthy diet, exercise and weight loss. Pap up to date. Declines covid vaccine. Flu shot up to date.       Relevant Orders   Comp Met (CMET)   Hemoglobin A1c   Lipid panel   Hyperglycemia - Primary   Relevant Orders   Comp Met (CMET)   Hemoglobin A1c   Lipid panel   HTN (hypertension)    BP Readings from Last 3 Encounters:  09/13/21 (!) 132/92  04/12/21 (!) 141/79  03/15/21 125/88  DBP mildly elevated.  SBP at goal. Will continue toprol xl and losartan.        Relevant Medications   losartan (COZAAR) 50 MG tablet   metoprolol succinate (TOPROL-XL) 25 MG 24 hr tablet    I have discontinued Delma L. Blakeney's HYDROcodone-acetaminophen, cyclobenzaprine, and predniSONE. I have also changed her omeprazole. Additionally, I am having her maintain her BIOTIN PO, gabapentin, losartan, metoprolol succinate, and valACYclovir.  Meds ordered this encounter  Medications   losartan (COZAAR) 50 MG tablet    Sig: Take 1  tablet (50 mg total) by mouth daily.    Dispense:  90 tablet    Refill:  1    Order Specific Question:   Supervising Provider    Answer:   Penni Homans A [[id number]]   metoprolol succinate (TOPROL-XL) 25 MG 24 hr tablet    Sig: Take 3 tablets (75 mg total) by mouth daily.    Dispense:  270 tablet    Refill:  0    Order Specific Question:   Supervising Provider    Answer:   Penni Homans A [[id number]]   omeprazole (PRILOSEC) 40 MG capsule    Sig: Take 1 capsule (40 mg total) by mouth daily as needed.    Dispense:  90 capsule    Refill:  1    Order Specific Question:   Supervising Provider    Answer:   Penni Homans A [[id number]]   valACYclovir (VALTREX) 500 MG tablet    Sig: Take 1 tablet (500 mg total) by mouth daily.    Dispense:  90 tablet    Refill:  0    Order Specific Question:   Supervising Provider    Answer:   Penni Homans A [[id number]]

## 2021-09-14 LAB — COMPREHENSIVE METABOLIC PANEL
AG Ratio: 1.6 (calc) (ref 1.0–2.5)
ALT: 16 U/L (ref 6–29)
AST: 15 U/L (ref 10–35)
Albumin: 4.2 g/dL (ref 3.6–5.1)
Alkaline phosphatase (APISO): 81 U/L (ref 37–153)
BUN: 18 mg/dL (ref 7–25)
CO2: 25 mmol/L (ref 20–32)
Calcium: 9.7 mg/dL (ref 8.6–10.4)
Chloride: 105 mmol/L (ref 98–110)
Creat: 0.87 mg/dL (ref 0.50–1.03)
Globulin: 2.6 g/dL (calc) (ref 1.9–3.7)
Glucose, Bld: 80 mg/dL (ref 65–99)
Potassium: 4.1 mmol/L (ref 3.5–5.3)
Sodium: 142 mmol/L (ref 135–146)
Total Bilirubin: 0.7 mg/dL (ref 0.2–1.2)
Total Protein: 6.8 g/dL (ref 6.1–8.1)

## 2021-09-14 LAB — LIPID PANEL
Cholesterol: 133 mg/dL (ref <200)
HDL: 49 mg/dL — ABNORMAL LOW (ref >=50)
LDL Cholesterol (Calc): 68 mg/dL (calc)
Non-HDL Cholesterol (Calc): 84 mg/dL (calc) (ref <130)
Total CHOL/HDL Ratio: 2.7 (calc) (ref <5.0)
Triglycerides: 82 mg/dL (ref <150)

## 2021-09-14 LAB — HEMOGLOBIN A1C
Hgb A1c MFr Bld: 6.3 % of total Hgb — ABNORMAL HIGH (ref <5.7)
Mean Plasma Glucose: 134 mg/dL
eAG (mmol/L): 7.4 mmol/L

## 2021-09-16 ENCOUNTER — Encounter: Payer: Self-pay | Admitting: Family

## 2021-09-18 ENCOUNTER — Other Ambulatory Visit (HOSPITAL_BASED_OUTPATIENT_CLINIC_OR_DEPARTMENT_OTHER): Payer: Self-pay

## 2021-09-18 DIAGNOSIS — B9689 Other specified bacterial agents as the cause of diseases classified elsewhere: Secondary | ICD-10-CM | POA: Diagnosis not present

## 2021-09-18 DIAGNOSIS — A5901 Trichomonal vulvovaginitis: Secondary | ICD-10-CM | POA: Diagnosis not present

## 2021-09-18 DIAGNOSIS — A599 Trichomoniasis, unspecified: Secondary | ICD-10-CM | POA: Diagnosis not present

## 2021-09-18 DIAGNOSIS — N760 Acute vaginitis: Secondary | ICD-10-CM | POA: Diagnosis not present

## 2021-09-18 MED ORDER — METRONIDAZOLE 500 MG PO TABS
ORAL_TABLET | ORAL | 0 refills | Status: DC
  Filled 2021-09-18: qty 14, 7d supply, fill #0

## 2021-09-24 ENCOUNTER — Other Ambulatory Visit: Payer: Self-pay

## 2021-09-24 ENCOUNTER — Ambulatory Visit
Admission: RE | Admit: 2021-09-24 | Discharge: 2021-09-24 | Disposition: A | Discharge status: Home / Self Care | Payer: 59 | Source: Ambulatory Visit | Attending: Family | Admitting: Family

## 2021-09-24 DIAGNOSIS — Z1231 Encounter for screening mammogram for malignant neoplasm of breast: Secondary | ICD-10-CM | POA: Diagnosis not present

## 2021-10-04 ENCOUNTER — Other Ambulatory Visit (HOSPITAL_COMMUNITY): Payer: Self-pay

## 2021-10-17 DIAGNOSIS — G4733 Obstructive sleep apnea (adult) (pediatric): Secondary | ICD-10-CM | POA: Diagnosis not present

## 2021-11-11 ENCOUNTER — Other Ambulatory Visit (HOSPITAL_COMMUNITY): Payer: Self-pay

## 2021-11-28 ENCOUNTER — Other Ambulatory Visit (HOSPITAL_COMMUNITY): Payer: Self-pay

## 2021-12-02 ENCOUNTER — Encounter: Payer: Self-pay | Admitting: Plastic Surgery

## 2021-12-02 ENCOUNTER — Ambulatory Visit: Payer: No Typology Code available for payment source | Admitting: Plastic Surgery

## 2021-12-02 ENCOUNTER — Other Ambulatory Visit: Payer: Self-pay

## 2021-12-02 ENCOUNTER — Encounter: Payer: Self-pay | Admitting: Family

## 2021-12-02 VITALS — BP 129/88 | HR 72 | Ht 65.0 in | Wt 256.0 lb

## 2021-12-02 DIAGNOSIS — G4733 Obstructive sleep apnea (adult) (pediatric): Secondary | ICD-10-CM | POA: Diagnosis not present

## 2021-12-02 DIAGNOSIS — N62 Hypertrophy of breast: Secondary | ICD-10-CM

## 2021-12-02 DIAGNOSIS — I10 Essential (primary) hypertension: Secondary | ICD-10-CM

## 2021-12-02 DIAGNOSIS — G8929 Other chronic pain: Secondary | ICD-10-CM

## 2021-12-02 DIAGNOSIS — M546 Pain in thoracic spine: Secondary | ICD-10-CM

## 2021-12-02 DIAGNOSIS — Z9889 Other specified postprocedural states: Secondary | ICD-10-CM

## 2021-12-02 DIAGNOSIS — Z0289 Encounter for other administrative examinations: Secondary | ICD-10-CM

## 2021-12-02 DIAGNOSIS — M542 Cervicalgia: Secondary | ICD-10-CM | POA: Insufficient documentation

## 2021-12-02 DIAGNOSIS — M549 Dorsalgia, unspecified: Secondary | ICD-10-CM | POA: Insufficient documentation

## 2021-12-02 NOTE — Progress Notes (Signed)
Patient ID: Denise Austin, female    DOB: Jan 01, 1971, 52 y.o.   MRN: [id number]   Chief Complaint  Patient presents with   consult   Breast Problem    The patient is a 51 y.o. female with a history of mammary hyperplasia for several years.  She has extremely large breasts causing symptoms that include the following: Back pain in the upper and lower back, including neck pain. She pulls or pins her bra straps to provide better lift and relief of the pressure and pain. She notices relief by holding her breast up manually.  Her shoulder straps cause grooves and pain and pressure that requires padding for relief. Pain medication is sometimes required with motrin and tylenol.  Activities that are hindered by enlarged breasts include: exercise and running.  She has tried supportive clothing as well as fitted bras without improvement.  Her breasts are extremely large and fairly symmetric.  She has hyperpigmentation of the inframammary area on both sides.  The sternal to nipple distance on the right is 31 cm and the left is 32 cm.  The IMF distance is 13 cm.  She is 5 feet 5 inches tall and weighs 258 pounds.  The BMI = 42.9 kg/m.  Preoperative bra size = DD cup.  She would like to be a C cup. The estimated excess breast tissue to be removed at the time of surgery = 850-900 grams on the left and 850-900 grams on the right.  Mammogram history: 11/22 negative.  Family history of breast cancer:  maternal aunt.  Tobacco use:  no.   The patient expresses the desire to pursue surgical intervention.  Surgical treatment includes back surgery twice by neurosurgery, cholecystectomy, wrist surgery and bilateral foot surgery.  She does not take any blood thinners and has not had any breast surgery.  Mammary Hyperplasia:  Review of Systems  Constitutional:  Positive for activity change. Negative for appetite change.  Eyes: Negative.   Respiratory: Negative.    Cardiovascular: Negative.   Gastrointestinal:  Negative.   Endocrine: Negative.   Genitourinary: Negative.   Musculoskeletal:  Positive for back pain and neck pain.  Skin: Negative.   Hematological: Negative.   Psychiatric/Behavioral: Negative.     Past Medical History:  Diagnosis Date   Allergy    GERD (gastroesophageal reflux disease)    Hyperglycemia    Hypertension    OSA (obstructive sleep apnea) 04/15/2018   PONV (postoperative nausea and vomiting)    Sleep apnea    wears cpap    Uterine fibroid     Past Surgical History:  Procedure Laterality Date   BUNIONECTOMY     CHOLECYSTECTOMY     LUMBAR LAMINECTOMY/DECOMPRESSION MICRODISCECTOMY Right 04/22/2017   Procedure: Extraforaminal Microdiscectomy - Lumbar two-Lumbar three - right;  Surgeon: Eustace Moore, MD;  Location: [place];  Service: Neurosurgery;  Laterality: Right;   lumbar surgery revision  04/24/2017   WOUND EXPLORATION N/A 05/22/2017   Procedure: Revision Of Lumbar Wound;  Surgeon: Eustace Moore, MD;  Location: [place];  Service: Neurosurgery;  Laterality: N/A;  Lumbar wound revision      Current Outpatient Medications:    BIOTIN PO, Take 1 tablet by mouth daily. Gummy vitamin, Disp: , Rfl:    gabapentin (NEURONTIN) 300 MG capsule, TAKE 1 CAPSULE BY MOUTH 2 TIMES DAILY., Disp: 60 capsule, Rfl: 2   losartan (COZAAR) 50 MG tablet, Take 1 tablet (50 mg total) by mouth daily., Disp: 90 tablet,  Rfl: 1   metoprolol succinate (TOPROL-XL) 25 MG 24 hr tablet, Take 3 tablets (75 mg total) by mouth daily., Disp: 270 tablet, Rfl: 0   metroNIDAZOLE (FLAGYL) 500 MG tablet, Take 1 tablet (500 mg total) by mouth 2 times daily for 7 days., Disp: 14 tablet, Rfl: 0   omeprazole (PRILOSEC) 40 MG capsule, Take 1 capsule (40 mg total) by mouth daily as needed., Disp: 90 capsule, Rfl: 1   valACYclovir (VALTREX) 500 MG tablet, Take 1 tablet (500 mg total) by mouth daily., Disp: 90 tablet, Rfl: 0   Objective:   There were no vitals filed for this visit.  Physical Exam Vitals and  nursing note reviewed.  Constitutional:      Appearance: Normal appearance.  HENT:     Head: Normocephalic and atraumatic.  Cardiovascular:     Rate and Rhythm: Normal rate.     Pulses: Normal pulses.  Pulmonary:     Effort: Pulmonary effort is normal.  Abdominal:     General: There is no distension.     Palpations: Abdomen is soft.     Tenderness: There is no abdominal tenderness.  Skin:    General: Skin is warm.     Coloration: Skin is not jaundiced.     Findings: No bruising or lesion.  Neurological:     Mental Status: She is alert and oriented to person, place, and time.  Psychiatric:        Mood and Affect: Mood normal.        Behavior: Behavior normal.        Thought Content: Thought content normal.    Assessment & Plan:  S/P lumbar laminectomy  Morbid obesity due to excess calories (HCC)  Primary hypertension  OSA (obstructive sleep apnea)  Symptomatic mammary hypertrophy  Chronic bilateral thoracic back pain  Neck pain  The procedure the patient selected and that was best for the patient was discussed. The risk were discussed and include but not limited to the following:  Breast asymmetry, fluid accumulation, firmness of the breast, inability to breast feed, loss of nipple or areola, skin loss, change in skin and nipple sensation, fat necrosis of the breast tissue, bleeding, infection and healing delay.  There are risks of anesthesia and injury to nerves or blood vessels.  Allergic reaction to tape, suture and skin glue are possible.  There will be swelling.  Any of these can lead to the need for revisional surgery.  A breast reduction has potential to interfere with diagnostic procedures in the future.  This procedure is best done when the breast is fully developed.  Changes in the breast will continue to occur over time: pregnancy, weight gain or weigh loss.    Total time: 40 minutes. This includes time spent with the patient during the visit as well as time spent  before and after the visit reviewing the chart, documenting the encounter and ordering pertinent studies. and literature emailed to the patient.   Physical therapy:  ordered Mammogram:  done and I reviewed it from 09/24/2021, negative Healthy Weight and Wellness:  referral made  Pictures were obtained of the patient and placed in the chart with the patient's or guardian's permission.  The patient is a good candidate for bilateral breast reduction with possible lateral liposuction.  She is going to go ahead through the physical therapy and get on the list for the healthy weight and wellness center.  She knows to give [place] a call when she has completed her  physical therapy.  [place], DO

## 2021-12-03 ENCOUNTER — Telehealth: Payer: Self-pay

## 2021-12-03 NOTE — Telephone Encounter (Signed)
Faxed referral, ov note, demographics, insurance card, drivers license to [facility] @ 5:46 pm; received confirmation @ 5:56 pm. Forwarded to clinical to scan folder.

## 2021-12-09 ENCOUNTER — Other Ambulatory Visit (INDEPENDENT_AMBULATORY_CARE_PROVIDER_SITE_OTHER): Payer: Self-pay | Admitting: Family

## 2021-12-09 ENCOUNTER — Other Ambulatory Visit (HOSPITAL_COMMUNITY): Payer: Self-pay

## 2021-12-09 NOTE — Telephone Encounter (Signed)
Right hip pain. Last filled 06/25/21 #60 with 2 refills.

## 2021-12-10 ENCOUNTER — Other Ambulatory Visit (HOSPITAL_COMMUNITY): Payer: Self-pay

## 2021-12-10 ENCOUNTER — Other Ambulatory Visit: Payer: Self-pay

## 2021-12-10 ENCOUNTER — Ambulatory Visit: Payer: No Typology Code available for payment source | Admitting: Physical Therapy

## 2021-12-10 ENCOUNTER — Encounter: Payer: Self-pay | Admitting: Physical Therapy

## 2021-12-10 DIAGNOSIS — M546 Pain in thoracic spine: Secondary | ICD-10-CM

## 2021-12-10 DIAGNOSIS — M6281 Muscle weakness (generalized): Secondary | ICD-10-CM | POA: Diagnosis not present

## 2021-12-10 DIAGNOSIS — M5441 Lumbago with sciatica, right side: Secondary | ICD-10-CM

## 2021-12-10 MED ORDER — GABAPENTIN 300 MG PO CAPS
300.0000 mg | ORAL_CAPSULE | Freq: Two times a day (BID) | ORAL | 2 refills | Status: DC
Start: 2021-12-10 — End: 2022-05-26
  Filled 2021-12-10: qty 60, 30d supply, fill #0
  Filled 2022-02-06: qty 60, 30d supply, fill #1
  Filled 2022-04-03: qty 60, 30d supply, fill #2

## 2021-12-10 NOTE — Therapy (Signed)
OUTPATIENT PHYSICAL THERAPY SHOULDER EVALUATION   Patient Name: Denise Austin MRN: [id number] DOB:1971/05/13, 51 y.o., female Today's Date: 12/11/2021   PT End of Session - 12/10/21 1732     Visit Number 1    Number of Visits 6    Date for PT Re-Evaluation 01/21/22    PT Start Time 0145    PT Stop Time 0230    PT Time Calculation (min) 45 min    Activity Tolerance Patient tolerated treatment well             Past Medical History:  Diagnosis Date   Allergy    GERD (gastroesophageal reflux disease)    Hyperglycemia    Hypertension    OSA (obstructive sleep apnea) 04/15/2018   PONV (postoperative nausea and vomiting)    Sleep apnea    wears cpap    Uterine fibroid    Past Surgical History:  Procedure Laterality Date   BUNIONECTOMY     CHOLECYSTECTOMY     LUMBAR LAMINECTOMY/DECOMPRESSION MICRODISCECTOMY Right 04/22/2017   Procedure: Extraforaminal Microdiscectomy - Lumbar two-Lumbar three - right;  Surgeon: Eustace Moore, MD;  Location: [place];  Service: Neurosurgery;  Laterality: Right;   lumbar surgery revision  04/24/2017   WOUND EXPLORATION N/A 05/22/2017   Procedure: Revision Of Lumbar Wound;  Surgeon: Eustace Moore, MD;  Location: [place];  Service: Neurosurgery;  Laterality: N/A;  Lumbar wound revision   Patient Active Problem List   Diagnosis Date Noted   Symptomatic mammary hypertrophy 12/02/2021   Back pain 12/02/2021   Neck pain 12/02/2021   Morbid obesity due to excess calories ([place]) 04/21/2019   OSA (obstructive sleep apnea) 04/15/2018   Herpes simplex viral infection 07/10/2017   S/P lumbar laminectomy 04/22/2017   GERD (gastroesophageal reflux disease) 07/25/2016   Hyperglycemia 07/25/2016   HTN (hypertension) 12/24/2015   Preventative health care 10/16/2014   [place] arthritis 07/06/2014   S/P excision of ganglion cyst 07/06/2014   Synovitis 07/06/2014     PCP: Debbrah Alar, NP  REFERRING PROVIDER: Wallace Going,  DO  REFERRING DIAG:  [phone] (ICD-10-CM) - S/P lumbar laminectomy  N62 (ICD-10-CM) - Symptomatic mammary hypertrophy  M54.6,G89.29 (ICD-10-CM) - Chronic bilateral thoracic back pain  M54.2 (ICD-10-CM) - Neck pain    THERAPY DIAG:  Pain in thoracic spine M54.6 Muscle weakness M62.8  ONSET DATE: 6+ months ago  SUBJECTIVE:                                                                                                                                                                                           SUBJECTIVE STATEMENT: Pt. Presents  to physical therapy with mid- back pain. She was referred to PT by her plastic surgeon, who believes a breast reduction surgery may be necessary to help relieve the back pain she is experiencing. Pt. stated that she wanted to try physical therapy for her back pain, before going through with the surgery.  PERTINENT HISTORY:  S/P lumbar laminectomy, HTN (hypertension),  Hyperglycemia, Morbid obesity due to excess calories ([place])    PAIN:  Are you having pain? Yes NPRS scale: 9/10 (with activity), 2/10 at rest Pain location: Bilat. Neck/ Shoulder area Pain orientation: Right and Left  PAIN TYPE: aching and tight Pain description: stabbing and aching  Aggravating factors: Lifting Relieving factors: Advil, Heat, Gabapentin  PRECAUTIONS: None  WEIGHT BEARING RESTRICTIONS No  FALLS:  Has patient fallen in last 6 months? 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  COGNITION:  Overall cognitive status: Within functional limits for tasks assessed      POSTURE:  Slight forward position, pt. Works at [employer] most of the day   UPPER EXTREMITY AROM/PROM:  All Shoulder ROM full,  there was pain with L shoulder abduction and flexion at end range. Neck ROM full and painfree (Blank rows = not tested)  UPPER EXTREMITY MMT:  MMT Right 12/11/2021 Left 12/11/2021  Shoulder flexion 4+/5 4+/5  Shoulder extension 3+/5 3+/5  Shoulder abduction 4/5 4/5  Shoulder internal rotation Measure at next visit Measure at next visit  Shoulder external rotation Measure at next visit Measure at next visit  Middle trapezius 5/5 5/5  Lower trapezius 4/5 4/5  Rhomboids 4+/5 4+/5  (Blank rows = not tested)  JOINT MOBILITY TESTING:  CPA's at T3-T6 spinal level show hypomobility with concordant pain   PALPATION: Tenderness to palpation at bilat supraspinatus area, upper trapezius and rhomboids.   TODAY'S TREATMENT:  Today we discussed pt's HEP focusing on thoracic mobility and shoulder strengthening. E- stim and heat were also used today on the pt's supraspinatus and thoracic para- spinal muscles bilaterally for pain modulation. (IFC x 10 minutes current to pt. tolerance)   PATIENT EDUCATION: Education details: HEP  Person educated: Patient Education method: Explanation, Demonstration, and Handouts Education comprehension: verbalized understanding and needs further education   HOME EXERCISE PROGRAM: Access Code: [email or web address] URL: [email or web address] Date: 12/10/2021 Prepared by: Elsie Ra  Exercises Seated Thoracic Lumbar Extension - 2 x daily - 6 x weekly - 3 sets - 10 reps Sidelying Thoracic Rotation with Open Book - 2 x daily - 6 x weekly - 1 sets - 10 reps - 10 sec hold Cat Cow - 2 x daily - 6 x weekly - 1 sets - 10 reps - 5 hold Standing Shoulder Horizontal Abduction with Resistance - 2 x daily - 6 x weekly - 2 sets - 10 reps lumbar-thoracic extension mobilization with movement using towel - 2 x daily - 6 x weekly - 2 sets - 10 reps - 5 hold Single Arm Shoulder Extension with Anchored Resistance - 2 x daily - 6 x weekly - 1-2 sets - 10  reps    ASSESSMENT:  ASSESSMENT:  CLINICAL IMPRESSION: Patient presents with signs and symptoms consistent with thoracic back pain. Objective impairments include decreased activity tolerance, decreased endurance, decreased mobility, decreased ROM, decreased  strength, impaired flexibility, impaired UE use, postural dysfunction, and pain. These impairments are limiting patient from activity/participation in bending, lifting, carry, cleaning, community activity, driving, and or occupation. Personal factors and comorbidities including S/P lumbar laminectomy, HTN (hypertension),  Hyperglycemia,  are also affecting patient's functional outcome. Patient will benefit from skilled PT to address above impairments and improve overall function.  REHAB POTENTIAL: Good  CLINICAL DECISION MAKING: stable/uncomplicated  EVALUATION COMPLEXITY: Low   GOALS: Short term PT Goals (target date for Short term goals are 4 weeks 01/07/22) Pt will be I and compliant with HEP. Baseline:  Goal status: New Pt will decrease pain by 25% overall Baseline: Goal status: New  Long term PT goals (target dates for all long term goals are 8 weeks 01/21/22) Pt will improve shoulder strength to at least 4+/5 MMT to improve functional strength Baseline: Goal status: New Pt will reduce pain to overall less than 2-3/10 with usual activity and work activity. Baseline: Goal status: New Maintain 85% FOTO score  Baseline: Goal status: New  PLAN: PT FREQUENCY: 1 times per week   PT DURATION: 6 weeks  PLANNED INTERVENTIONS (unless contraindicated): aquatic PT, cryotherapy, Electrical stimulation, Iontophoresis with 4 mg/ml dexamethasome, Moist heat, traction, Ultrasound, gait training, Therapeutic exercise, balance training, neuromuscular re-education, patient/family education, , manual techniques, passive ROM, dry needling, taping, vasopnuematic device, spinal manipulations, joint manipulations  PLAN FOR NEXT SESSION:  Reassess pt's HEP, discuss results of E-stim, UBE, consider DN  Janiya Millirons Singer, Student-PT 12/11/2021, 11:07 AM

## 2021-12-11 ENCOUNTER — Encounter: Payer: Self-pay | Admitting: Physical Therapy

## 2021-12-17 ENCOUNTER — Ambulatory Visit (INDEPENDENT_AMBULATORY_CARE_PROVIDER_SITE_OTHER): Payer: Self-pay | Admitting: Family Medicine

## 2021-12-18 ENCOUNTER — Other Ambulatory Visit (HOSPITAL_COMMUNITY): Payer: Self-pay

## 2021-12-19 ENCOUNTER — Ambulatory Visit: Payer: No Typology Code available for payment source | Admitting: Physical Therapy

## 2021-12-19 ENCOUNTER — Encounter: Payer: Self-pay | Admitting: Physical Therapy

## 2021-12-19 ENCOUNTER — Other Ambulatory Visit: Payer: Self-pay

## 2021-12-19 DIAGNOSIS — M546 Pain in thoracic spine: Secondary | ICD-10-CM

## 2021-12-19 DIAGNOSIS — M5441 Lumbago with sciatica, right side: Secondary | ICD-10-CM | POA: Diagnosis not present

## 2021-12-19 DIAGNOSIS — M6281 Muscle weakness (generalized): Secondary | ICD-10-CM | POA: Diagnosis not present

## 2021-12-19 NOTE — Therapy (Signed)
OUTPATIENT PHYSICAL THERAPY TREATMENT NOTE   Patient Name: Denise Austin MRN: [id number] DOB:1971-03-21, 51 y.o., female Today's Date: 12/19/2021  PCP: Debbrah Alar, NP REFERRING PROVIDER: Wallace Going, DO    Past Medical History:  Diagnosis Date   Allergy    GERD (gastroesophageal reflux disease)    Hyperglycemia    Hypertension    OSA (obstructive sleep apnea) 04/15/2018   PONV (postoperative nausea and vomiting)    Sleep apnea    wears cpap    Uterine fibroid    Past Surgical History:  Procedure Laterality Date   BUNIONECTOMY     CHOLECYSTECTOMY     LUMBAR LAMINECTOMY/DECOMPRESSION MICRODISCECTOMY Right 04/22/2017   Procedure: Extraforaminal Microdiscectomy - Lumbar two-Lumbar three - right;  Surgeon: Eustace Moore, MD;  Location: [facility];  Service: Neurosurgery;  Laterality: Right;   lumbar surgery revision  04/24/2017   WOUND EXPLORATION N/A 05/22/2017   Procedure: Revision Of Lumbar Wound;  Surgeon: Eustace Moore, MD;  Location: [facility];  Service: Neurosurgery;  Laterality: N/A;  Lumbar wound revision   Patient Active Problem List   Diagnosis Date Noted   Symptomatic mammary hypertrophy 12/02/2021   Back pain 12/02/2021   Neck pain 12/02/2021   Morbid obesity due to excess calories ([place]) 04/21/2019   OSA (obstructive sleep apnea) 04/15/2018   Herpes simplex viral infection 07/10/2017   S/P lumbar laminectomy 04/22/2017   GERD (gastroesophageal reflux disease) 07/25/2016   Hyperglycemia 07/25/2016   HTN (hypertension) 12/24/2015   Preventative health care 10/16/2014   [place] arthritis 07/06/2014   S/P excision of ganglion cyst 07/06/2014   Synovitis 07/06/2014    PCP: Debbrah Alar, NP   REFERRING PROVIDER: Wallace Going, DO   REFERRING DIAG:  [phone] (ICD-10-CM) - S/P lumbar laminectomy  N62 (ICD-10-CM) - Symptomatic mammary hypertrophy  M54.6,G89.29 (ICD-10-CM) - Chronic bilateral thoracic back pain  M54.2 (ICD-10-CM) -  Neck pain      THERAPY DIAG:  Pain in thoracic spine M54.6 Muscle weakness M62.8   ONSET DATE: 6+ months ago   SUBJECTIVE:                                                                                                                                                                                            SUBJECTIVE STATEMENT: Pt states that her thoracic back pain is around a 6/10 today. She reports compliance with her HEP and that the exercises have been very helpful.   PERTINENT HISTORY:  S/P lumbar laminectomy, HTN (hypertension),  Hyperglycemia, Morbid obesity due to excess calories ([place])      PAIN:  Are you having pain?  Yes NPRS scale: 6/10 Pain location: Bilat. 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   COGNITION:          Overall cognitive status: Within functional limits for tasks assessed                          POSTURE:  Slight forward position, pt. Works at [employer] most of the day    UPPER EXTREMITY AROM/PROM:           All Shoulder ROM full, there was pain with L shoulder abduction and flexion at end range. Neck ROM full and painfree (Blank rows = not tested)   UPPER EXTREMITY MMT:   MMT Right 12/11/2021 Left 12/11/2021  Shoulder flexion 4+/5 4+/5  Shoulder extension 3+/5 3+/5  Shoulder abduction 4/5 4/5  Shoulder internal rotation Measure at next visit Measure at next visit  Shoulder external rotation Measure at next visit Measure at next visit  Middle trapezius 5/5 5/5  Lower trapezius 4/5 4/5  Rhomboids 4+/5 4+/5  (Blank rows = not  tested)   JOINT MOBILITY TESTING:  CPA's at T3-T6 spinal level show hypomobility with concordant pain    PALPATION: Tenderness to palpation at bilat supraspinatus area, upper trapezius and rhomboids.            TODAY'S TREATMENT:  12/19/2021:  Therapeutic Exercise:  Aerobic: UBE lvl 3, 3 min forward/ 3 min backward  Supine: Sidelying: Thoraco- lumbar rotation (open books) x 15 bilat  Seated:  Standing: Red ball walk ups x 15; Green theraband 2 x 10 bilat shoulder rows, shoulder ext, horizontal abd.   Quadruped: Cat and Cow stretch x 12 Neuromuscular Re-education: Manual Therapy: Therapeutic Activity: Self Care: Trigger Point Dry Needling:  Modalities:  E- stim and heat on pt's supraspinatus and thoracic para- spinal muscles bilaterally for pain modulation. (IFC x 10 minutes current to pt. tolerance)     12/10/2021 Today we discussed pt's HEP focusing on thoracic mobility and shoulder strengthening. E- stim and heat were also used today on the pt's supraspinatus and thoracic para- spinal muscles bilaterally for pain modulation. (IFC x 10 minutes current to pt. tolerance)     PATIENT EDUCATION: Education details: HEP  Person educated: Patient Education method: Explanation, Demonstration, and Handouts Education comprehension: verbalized understanding and needs further education     HOME EXERCISE PROGRAM: Access Code: [email or web address] URL: [email or web address] Date: 12/10/2021 Prepared by: Elsie Ra   Exercises Seated Thoracic Lumbar Extension - 2 x daily - 6 x weekly - 3 sets - 10 reps Sidelying Thoracic Rotation with Open Book - 2 x daily - 6 x weekly - 1 sets - 10 reps - 10 sec hold Cat Cow - 2 x daily - 6 x weekly - 1 sets - 10 reps - 5 hold Standing Shoulder Horizontal Abduction with Resistance - 2 x daily - 6 x weekly - 2 sets - 10 reps lumbar-thoracic extension mobilization with movement using towel - 2 x daily - 6 x weekly - 2 sets - 10 reps - 5  hold Single Arm Shoulder Extension  with Anchored Resistance - 2 x daily - 6 x weekly - 1-2 sets - 10 reps       ASSESSMENT:   ASSESSMENT:   CLINICAL IMPRESSION: Pt is showing increased mobility with the thoraco- lumbar region. This is noted with the pt being able to complete today's ROM and strengthening exercises, without increasing her pain. E-stim was also utilized during today's visit for pain modulation, and this treatment was well received by the pt.   REHAB POTENTIAL: Good   CLINICAL DECISION MAKING: stable/uncomplicated   EVALUATION COMPLEXITY: Low     GOALS: Short term PT Goals (target date for Short term goals are 4 weeks 01/07/22) Pt will be I and compliant with HEP. Baseline:  Goal status: on- going Pt will decrease pain by 25% overall Baseline: Goal status:  on- going   Long term PT goals (target dates for all long term goals are 8 weeks 01/21/22) Pt will improve shoulder strength to at least 4+/5 MMT to improve functional strength Baseline: Goal status:  on- going Pt will reduce pain to overall less than 2-3/10 with usual activity and work activity. Baseline: Goal status:  on- going Maintain 85% FOTO score  Baseline: Goal status:  on- going   PLAN: PT FREQUENCY: 1 times per week    PT DURATION: 6 weeks   PLANNED INTERVENTIONS (unless contraindicated): aquatic PT, cryotherapy, Electrical stimulation, Iontophoresis with 4 mg/ml dexamethasome, Moist heat, traction, Ultrasound, gait training, Therapeutic exercise, balance training, neuromuscular re-education, patient/family education, , manual techniques, passive ROM, dry needling, taping, vasopnuematic device, spinal manipulations, joint manipulations   PLAN FOR NEXT SESSION: DN?, Continue to progress thoracic ROM and strengthening exercises   Harun Brumley Singer, Student-PT 12/11/2021, 11:07 AM

## 2021-12-24 ENCOUNTER — Other Ambulatory Visit: Payer: Self-pay

## 2021-12-24 ENCOUNTER — Encounter (INDEPENDENT_AMBULATORY_CARE_PROVIDER_SITE_OTHER): Payer: Self-pay | Admitting: Family Medicine

## 2021-12-24 ENCOUNTER — Ambulatory Visit (INDEPENDENT_AMBULATORY_CARE_PROVIDER_SITE_OTHER): Payer: No Typology Code available for payment source | Admitting: Family Medicine

## 2021-12-24 VITALS — BP 115/72 | HR 76 | Temp 98.4°F | Ht 65.0 in | Wt 251.0 lb

## 2021-12-24 DIAGNOSIS — Z1331 Encounter for screening for depression: Secondary | ICD-10-CM | POA: Diagnosis not present

## 2021-12-24 DIAGNOSIS — K219 Gastro-esophageal reflux disease without esophagitis: Secondary | ICD-10-CM

## 2021-12-24 DIAGNOSIS — I10 Essential (primary) hypertension: Secondary | ICD-10-CM

## 2021-12-24 DIAGNOSIS — R0602 Shortness of breath: Secondary | ICD-10-CM

## 2021-12-24 DIAGNOSIS — Z9189 Other specified personal risk factors, not elsewhere classified: Secondary | ICD-10-CM

## 2021-12-24 DIAGNOSIS — Z6841 Body Mass Index (BMI) 40.0 and over, adult: Secondary | ICD-10-CM

## 2021-12-24 DIAGNOSIS — E669 Obesity, unspecified: Secondary | ICD-10-CM

## 2021-12-24 DIAGNOSIS — G4733 Obstructive sleep apnea (adult) (pediatric): Secondary | ICD-10-CM

## 2021-12-24 DIAGNOSIS — R5383 Other fatigue: Secondary | ICD-10-CM | POA: Diagnosis not present

## 2021-12-24 DIAGNOSIS — R7303 Prediabetes: Secondary | ICD-10-CM

## 2021-12-24 NOTE — Progress Notes (Signed)
Dear Denise Alar, NP,   Thank you for referring Denise Austin to our clinic. The following note includes my evaluation and treatment recommendations.  Chief Complaint:   OBESITY Denise Austin (MR# [id number]) is a 51 y.o. female who presents for evaluation and treatment of obesity and related comorbidities. Current BMI is Body mass index is 41.77 kg/m. Denise Austin has been struggling with her weight for many years and has been unsuccessful in either losing weight, maintaining weight loss, or reaching her healthy weight goal.  Denise Austin is currently in the action stage of change and ready to dedicate time achieving and maintaining a healthier weight. Denise Austin is interested in becoming our patient and working on intensive lifestyle modifications including (but not limited to) diet and exercise for weight loss.  Referred by Denise Alar, NP and Dr. Marla Austin. Pt is hopeful for a breast reduction. She is a Teaching laboratory technician at [employer]. Pt skips breakfast 1-2 times a week. Breakfast in AM is eggs + [place] sausage +/- cheese (3 eggs + 2 sausages); Lunch- Chipotle or [employer] (can bring lunch), chicken bowl (feel full); Dinner- 4 baked chicken drumettes, 1 cup rice, 1 cup veggies + roll; After dinner- chips- eating out of big bag.  Denise Austin habits were reviewed today and are as follows: Her family eats meals together, her desired weight loss is 56 lbs, she started gaining weight 3-4 years ago, her heaviest weight ever was 257 pounds, she is a picky eater and doesn't like to eat healthier foods, she has significant food cravings issues, she skips meals frequently, she is frequently drinking liquids with calories, she frequently makes poor food choices, she frequently eats larger portions than normal, she has binge eating behaviors, and she struggles with emotional eating.  Depression Screen Denise Austin's Food and Mood (modified PHQ-9) score was 3.  Depression screen  [name] 2/9 12/24/2021  Decreased Interest 0  Down, Depressed, Hopeless 0  PHQ - 2 Score 0  Altered sleeping 1  Tired, decreased energy 1  Change in appetite 1  Feeling bad or failure about yourself  0  Trouble concentrating 0  Moving slowly or fidgety/restless 0  Suicidal thoughts 0  PHQ-9 Score 3  Difficult doing work/chores Not difficult at all   Subjective:   1. Other fatigue Denise Austin admits to daytime somnolence and admits to waking up still tired. Patient has a history of symptoms of daytime fatigue and morning fatigue. Denise Austin generally gets 6 hours of sleep per night, and states that she has generally restful sleep. Snoring is present. Apneic episodes are present. Epworth Sleepiness Score is 4. EKG normal sinus rhythm at 74 bpm.  2. SOBOE (shortness of breath on exertion) Denise Austin notes increasing shortness of breath with exercising and seems to be worsening over time with weight gain. She notes getting out of breath sooner with activity than she used to. This has gotten worse recently. Denise Austin denies shortness of breath at rest or orthopnea.  3. OSA (obstructive sleep apnea) Pt has a CPAP and wears it 5 out of 7 nights. The other 2 nights she starts with CPAP but doesn't get through the whole night. She has just the nose mask.  4. Essential hypertension Dx about 5 years ago. BP controlled. Pt is on losartan and metoprolol (previously on amlodipine but changed due to swelling).  5. Gastroesophageal reflux disease, unspecified whether esophagitis present Pt is on omeprazole daily and sxs are well managed when she takes it.  6. Prediabetes Pt's last A1c  was 6.3. No insulin level and pt is not on meds.  7. At risk for deficient intake of food Denise Austin is at risk for deficient intake of food due to current food options.  Assessment/Plan:   1. Other fatigue Denise Austin does feel that her weight is causing her energy to be lower than it should be. Fatigue may be related to  obesity, depression or many other causes. Labs will be ordered, and in the meanwhile, Denise Austin will focus on self care including making healthy food choices, increasing physical activity and focusing on stress reduction. Check labs today.  - EKG 12-Lead - Vitamin B12 - Folate - T3 - T4, free - TSH - VITAMIN D 25 Hydroxy (Vit-D Deficiency, Fractures)  2. SOBOE (shortness of breath on exertion) Denise Austin does feel that she gets out of breath more easily that she used to when she exercises. Denise Austin shortness of breath appears to be obesity related and exercise induced. She has agreed to work on weight loss and gradually increase exercise to treat her exercise induced shortness of breath. Will continue to monitor closely. Check labs today.  - CBC with Differential/Platelet - Lipid Panel With LDL/HDL Ratio  3. OSA (obstructive sleep apnea) Intensive lifestyle modifications are the first line treatment for this issue. We discussed several lifestyle modifications today and she will continue to work on diet, exercise and weight loss efforts. We will continue to monitor. Orders and follow up as documented in patient record. F/u with Dr. Annamaria Austin.  4. Essential hypertension Denise Austin is working on healthy weight loss and exercise to improve blood pressure control. We will watch for signs of hypotension as she continues her lifestyle modifications. Check labs today. F/u on BP at next appt.  - Comprehensive metabolic panel  5. Gastroesophageal reflux disease, unspecified whether esophagitis present Intensive lifestyle modifications are the first line treatment for this issue. We discussed several lifestyle modifications today and she will continue to work on diet, exercise and weight loss efforts. Orders and follow up as documented in patient record. F/u on sxs at next appt.  Counseling If a person has gastroesophageal reflux disease (GERD), food and stomach acid move back up into the esophagus and  cause symptoms or problems such as damage to the esophagus. Anti-reflux measures include: raising the head of the bed, avoiding tight clothing or belts, avoiding eating late at night, not lying down shortly after mealtime, and achieving weight loss. Avoid ASA, NSAID's, caffeine, alcohol, and tobacco.  OTC Pepcid and/or Tums are often very helpful for as needed use.  However, for persisting chronic or daily symptoms, stronger medications like Omeprazole may be needed. You may need to avoid foods and drinks such as: Coffee and tea (with or without caffeine). Drinks that contain alcohol. Energy drinks and sports drinks. Bubbly (carbonated) drinks or sodas. Chocolate and cocoa. Peppermint and mint flavorings. Garlic and onions. Horseradish. Spicy and acidic foods. These include peppers, chili powder, curry powder, vinegar, hot sauces, and BBQ sauce. Citrus fruit juices and citrus fruits, such as oranges, lemons, and limes. Tomato-based foods. These include red sauce, chili, salsa, and pizza with red sauce. Fried and fatty foods. These include donuts, french fries, potato chips, and high-fat dressings. High-fat meats. These include hot dogs, rib eye steak, sausage, ham, and bacon.  6. Prediabetes Eydie will continue to work on weight loss, exercise, and decreasing simple carbohydrates to help decrease the risk of diabetes. Check labs today.  - Hemoglobin A1c - Insulin, random  7. Depression screening Kaliyah had  a negative depression screening. Depression is commonly associated with obesity and often results in emotional eating behaviors. We will monitor this closely and work on CBT to help improve the non-hunger eating patterns. Referral to Psychology may be required if no improvement is seen as she continues in our clinic.  8. At risk for deficient intake of food Mayrene was given approximately 15 minutes of deficient intake of food prevention counseling today. Anavi is at risk  for eating too few calories based on current food recall. She was encouraged to focus on meeting caloric and protein goals according to her recommended meal plan.  9. Obesity with current BMI of 41.9 Lailana is currently in the action stage of change and her goal is to continue with weight loss efforts. I recommend Salvatore begin the structured treatment plan as follows:  She has agreed to the Category 3 Plan.  Exercise goals: No exercise has been prescribed at this time.   Behavioral modification strategies: increasing lean protein intake, meal planning and cooking strategies, keeping healthy foods in the home, and planning for success.  She was informed of the importance of frequent follow-up visits to maximize her success with intensive lifestyle modifications for her multiple health conditions. She was informed we would discuss her lab results at her next visit unless there is a critical issue that needs to be addressed sooner. Kalista agreed to keep her next visit at the agreed upon time to discuss these results.  Objective:   Blood pressure 115/72, pulse 76, temperature 98.4 F (36.9 C), height 5\' 5"  (1.651 m), weight 251 lb (113.9 kg), last menstrual period 04/02/2018, SpO2 99 %. Body mass index is 41.77 kg/m.  EKG: Normal sinus rhythm, rate 74.  Indirect Calorimeter completed today shows a VO2 of 267 and a REE of 1843.  Her calculated basal metabolic rate is 2671 thus her basal metabolic rate is worse than expected.  General: Cooperative, alert, well developed, in no acute distress. HEENT: Conjunctivae and lids unremarkable. Cardiovascular: Regular rhythm.  Lungs: Normal work of breathing. Neurologic: No focal deficits.   Lab Results  Component Value Date   CREATININE 0.87 09/13/2021   BUN 18 09/13/2021   NA 142 09/13/2021   K 4.1 09/13/2021   CL 105 09/13/2021   CO2 25 09/13/2021   Lab Results  Component Value Date   ALT 16 09/13/2021   AST 15 09/13/2021    ALKPHOS 72 01/21/2018   BILITOT 0.7 09/13/2021   Lab Results  Component Value Date   HGBA1C 6.3 (H) 09/13/2021   HGBA1C 6.1 (H) 01/25/2021   HGBA1C 5.9 01/22/2018   HGBA1C 5.7 01/16/2017   HGBA1C 5.4 07/25/2016   No results found for: INSULIN Lab Results  Component Value Date   TSH 1.17 06/25/2020   Lab Results  Component Value Date   CHOL 133 09/13/2021   HDL 49 (L) 09/13/2021   LDLCALC 68 09/13/2021   TRIG 82 09/13/2021   CHOLHDL 2.7 09/13/2021   Lab Results  Component Value Date   WBC 8.2 06/25/2020   HGB 12.9 06/25/2020   HCT 37.9 06/25/2020   MCV 92.7 06/25/2020   PLT 298 06/25/2020   Attestation Statements:   Reviewed by clinician on day of visit: allergies, medications, problem list, medical history, surgical history, family history, social history, and previous encounter notes.  Coral Ceo, CMA, am acting as transcriptionist for Coralie Common, MD.   This is the patient's first visit at [facility]. The patient's NEW  PATIENT PACKET was reviewed at length. Included in the packet: current and past health history, medications, allergies, ROS, gynecologic history (women only), surgical history, family history, social history, weight history, weight loss surgery history (for those that have had weight loss surgery), nutritional evaluation, mood and food questionnaire, PHQ9, Epworth questionnaire, sleep habits questionnaire, patient life and health improvement goals questionnaire. These will all be scanned into the patient's chart under media.   During the visit, I independently reviewed the patient's EKG, bioimpedance scale results, and indirect calorimeter results. I used this information to tailor a meal plan for the patient that will help her to lose weight and will improve her obesity-related conditions going forward. I performed a medically necessary appropriate examination and/or evaluation. I discussed the assessment and treatment plan with  the patient. The patient was provided an opportunity to ask questions and all were answered. The patient agreed with the plan and demonstrated an understanding of the instructions. Labs were ordered at this visit and will be reviewed at the next visit unless more critical results need to be addressed immediately. Clinical information was updated and documented in the EMR.    A separate 15 minutes was spent on risk counseling (see above).  I have reviewed the above documentation for accuracy and completeness, and I agree with the above. - Coralie Common, MD

## 2021-12-25 ENCOUNTER — Encounter: Payer: No Typology Code available for payment source | Admitting: Physical Therapy

## 2021-12-25 LAB — CBC WITH DIFFERENTIAL/PLATELET
Basophils Absolute: 0 10*3/uL (ref 0.0–0.2)
Basos: 0 %
EOS (ABSOLUTE): 0.1 10*3/uL (ref 0.0–0.4)
Eos: 2 %
Hematocrit: 41.4 % (ref 34.0–46.6)
Hemoglobin: 13.7 g/dL (ref 11.1–15.9)
Immature Grans (Abs): 0 10*3/uL (ref 0.0–0.1)
Immature Granulocytes: 0 %
Lymphocytes Absolute: 2 10*3/uL (ref 0.7–3.1)
Lymphs: 30 %
MCH: 30.4 pg (ref 26.6–33.0)
MCHC: 33.1 g/dL (ref 31.5–35.7)
MCV: 92 fL (ref 79–97)
Monocytes Absolute: 0.4 10*3/uL (ref 0.1–0.9)
Monocytes: 5 %
Neutrophils Absolute: 4.3 10*3/uL (ref 1.4–7.0)
Neutrophils: 63 %
Platelets: 291 10*3/uL (ref 150–450)
RBC: 4.5 x10E6/uL (ref 3.77–5.28)
RDW: 12.5 % (ref 11.7–15.4)
WBC: 6.8 10*3/uL (ref 3.4–10.8)

## 2021-12-25 LAB — COMPREHENSIVE METABOLIC PANEL
ALT: 13 IU/L (ref 0–32)
AST: 14 IU/L (ref 0–40)
Albumin/Globulin Ratio: 1.4 (ref 1.2–2.2)
Albumin: 4.1 g/dL (ref 3.8–4.9)
Alkaline Phosphatase: 90 IU/L (ref 44–121)
BUN/Creatinine Ratio: 24 — ABNORMAL HIGH (ref 9–23)
BUN: 21 mg/dL (ref 6–24)
Bilirubin Total: 0.5 mg/dL (ref 0.0–1.2)
CO2: 24 mmol/L (ref 20–29)
Calcium: 9.8 mg/dL (ref 8.7–10.2)
Chloride: 104 mmol/L (ref 96–106)
Creatinine, Ser: 0.89 mg/dL (ref 0.57–1.00)
Globulin, Total: 3 g/dL (ref 1.5–4.5)
Glucose: 98 mg/dL (ref 70–99)
Potassium: 4.3 mmol/L (ref 3.5–5.2)
Sodium: 140 mmol/L (ref 134–144)
Total Protein: 7.1 g/dL (ref 6.0–8.5)
eGFR: 78 mL/min/{1.73_m2} (ref >59)

## 2021-12-25 LAB — INSULIN, RANDOM: INSULIN: 11.6 u[IU]/mL (ref 2.6–24.9)

## 2021-12-25 LAB — LIPID PANEL WITH LDL/HDL RATIO
Cholesterol, Total: 136 mg/dL (ref 100–199)
HDL: 50 mg/dL (ref >39)
LDL Chol Calc (NIH): 72 mg/dL (ref 0–99)
LDL/HDL Ratio: 1.4 ratio (ref 0.0–3.2)
Triglycerides: 72 mg/dL (ref 0–149)
VLDL Cholesterol Cal: 14 mg/dL (ref 5–40)

## 2021-12-25 LAB — T3: T3, Total: 136 ng/dL (ref 71–180)

## 2021-12-25 LAB — VITAMIN D 25 HYDROXY (VIT D DEFICIENCY, FRACTURES): Vit D, 25-Hydroxy: 29.4 ng/mL — ABNORMAL LOW (ref 30.0–100.0)

## 2021-12-25 LAB — HEMOGLOBIN A1C
Est. average glucose Bld gHb Est-mCnc: 131 mg/dL
Hgb A1c MFr Bld: 6.2 % — ABNORMAL HIGH (ref 4.8–5.6)

## 2021-12-25 LAB — FOLATE: Folate: 9.5 ng/mL (ref >3.0)

## 2021-12-25 LAB — VITAMIN B12: Vitamin B-12: 821 pg/mL (ref 232–1245)

## 2021-12-25 LAB — T4, FREE: Free T4: 1.23 ng/dL (ref 0.82–1.77)

## 2021-12-25 LAB — TSH: TSH: 1.05 u[IU]/mL (ref 0.450–4.500)

## 2021-12-26 ENCOUNTER — Other Ambulatory Visit (HOSPITAL_COMMUNITY): Payer: Self-pay

## 2021-12-27 ENCOUNTER — Other Ambulatory Visit (HOSPITAL_COMMUNITY): Payer: Self-pay

## 2021-12-31 ENCOUNTER — Ambulatory Visit (INDEPENDENT_AMBULATORY_CARE_PROVIDER_SITE_OTHER): Payer: 59 | Admitting: Family Medicine

## 2022-01-01 ENCOUNTER — Encounter: Payer: Self-pay | Admitting: Physical Therapy

## 2022-01-01 ENCOUNTER — Ambulatory Visit (INDEPENDENT_AMBULATORY_CARE_PROVIDER_SITE_OTHER): Payer: No Typology Code available for payment source | Admitting: Physical Therapy

## 2022-01-01 ENCOUNTER — Other Ambulatory Visit: Payer: Self-pay

## 2022-01-01 DIAGNOSIS — M546 Pain in thoracic spine: Secondary | ICD-10-CM | POA: Diagnosis not present

## 2022-01-01 DIAGNOSIS — M6281 Muscle weakness (generalized): Secondary | ICD-10-CM | POA: Diagnosis not present

## 2022-01-01 DIAGNOSIS — M5441 Lumbago with sciatica, right side: Secondary | ICD-10-CM | POA: Diagnosis not present

## 2022-01-01 NOTE — Therapy (Signed)
OUTPATIENT PHYSICAL THERAPY TREATMENT NOTE   Patient Name: Denise Austin MRN: [id number] DOB:Nov 03, 1970, 51 y.o., female Today's Date: 01/01/2022  PCP: Debbrah Alar, NP REFERRING PROVIDER: Wallace Going, DO   PT End of Session - 01/01/22 1315     Visit Number 3    Number of Visits 6    Date for PT Re-Evaluation 01/21/22    PT Start Time 1300    PT Stop Time 1345    PT Time Calculation (min) 45 min    Activity Tolerance Patient tolerated treatment well             Past Medical History:  Diagnosis Date   Allergy    GERD (gastroesophageal reflux disease)    Hyperglycemia    Hypertension    OSA (obstructive sleep apnea) 04/15/2018   PONV (postoperative nausea and vomiting)    Sleep apnea    wears cpap    Uterine fibroid    Past Surgical History:  Procedure Laterality Date   BUNIONECTOMY     Lt-01/07/06, Rt-09/15/07   CHOLECYSTECTOMY     CYST REMOVAL HAND     Left Wrist   LUMBAR LAMINECTOMY/DECOMPRESSION MICRODISCECTOMY Right 04/22/2017   Procedure: Extraforaminal Microdiscectomy - Lumbar two-Lumbar three - right;  Surgeon: Eustace Moore, MD;  Location: [place];  Service: Neurosurgery;  Laterality: Right;   lumbar surgery revision  04/24/2017   WOUND EXPLORATION N/A 05/22/2017   Procedure: Revision Of Lumbar Wound;  Surgeon: Eustace Moore, MD;  Location: [place];  Service: Neurosurgery;  Laterality: N/A;  Lumbar wound revision   Patient Active Problem List   Diagnosis Date Noted   Symptomatic mammary hypertrophy 12/02/2021   Back pain 12/02/2021   Neck pain 12/02/2021   Morbid obesity due to excess calories ([place]) 04/21/2019   OSA (obstructive sleep apnea) 04/15/2018   Herpes simplex viral infection 07/10/2017   S/P lumbar laminectomy 04/22/2017   GERD (gastroesophageal reflux disease) 07/25/2016   Hyperglycemia 07/25/2016   HTN (hypertension) 12/24/2015   Preventative health care 10/16/2014   [place] arthritis 07/06/2014   S/P excision of  ganglion cyst 07/06/2014   Synovitis 07/06/2014    PCP: Debbrah Alar, NP   REFERRING PROVIDER: Wallace Going, DO   REFERRING DIAG:  [phone] (ICD-10-CM) - S/P lumbar laminectomy  N62 (ICD-10-CM) - Symptomatic mammary hypertrophy  M54.6,G89.29 (ICD-10-CM) - Chronic bilateral thoracic back pain  M54.2 (ICD-10-CM) - Neck pain      THERAPY DIAG:  Pain in thoracic spine M54.6 Muscle weakness M62.8   ONSET DATE: 6+ months ago   SUBJECTIVE:  SUBJECTIVE STATEMENT: Pt states that her thoracic back pain is around a 2/10 today. She reports she has been doing her exercises and walking more.  PERTINENT HISTORY:  S/P lumbar laminectomy, HTN (hypertension),  Hyperglycemia, Morbid obesity due to excess calories ([place])      PAIN:  Are you having pain? Yes NPRS scale: 2/10 Pain location: Bilat. 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 Works at [employer] most of the day    UPPER EXTREMITY AROM/PROM:           All Shoulder ROM full, there was pain with L shoulder abduction and flexion at end range. Neck ROM full and painfree (Blank rows = not tested)   UPPER EXTREMITY MMT:   MMT Right 12/11/2021 Left 12/11/2021 Right 01/01/22 Left 01/01/22  Shoulder flexion 4+/5 4+/5 4+ 4+  Shoulder extension 3+/5 3+/5    Shoulder abduction 4/5 4/5 4+ 4+  Shoulder internal rotation  Measure at next visit Measure at next visit 5 5  Shoulder external rotation Measure at next visit Measure at next visit 5 5  Middle trapezius 5/5 5/5    Lower trapezius 4/5 4/5    Rhomboids 4+/5 4+/5    (Blank rows = not tested)   JOINT MOBILITY TESTING:  CPA's at T3-T6 spinal level show hypomobility with concordant pain    PALPATION: Tenderness to palpation at bilat supraspinatus area, upper trapezius and rhomboids.            TODAY'S TREATMENT:  01/01/2022:  Therapeutic Exercise:  Aerobic: UBE lvl 5, 3 min forward/ 3 min backward  Supine: Sidelying:   Seated:pball roll outs into lumbar flexion/throacic extensionfwd and diagonals 10 sec X 5  Standing: Green theraband X 15 alternating unilat shoulder rows and shoulder ext. Green band for bilat horizontal abd and ER  X15 ea  Quadruped: Cat and Cow stretch x 10 holding 5 sec Neuromuscular Re-education: Manual Therapy: Therapeutic Activity: Self Care: Trigger Point Dry Needling:  Modalities:  E- stim and heat bilat upper traps/supraspinatus and  thoracic para- spinal muscles bilaterally for pain modulation. (IFC x 10 minutes current to pt. tolerance)  12/19/2021:  Therapeutic Exercise:  Aerobic: UBE lvl 3, 3 min forward/ 3 min backward  Supine: Sidelying: Thoraco- lumbar rotation (open books) x 15 bilat  Seated:  Standing: Red ball walk ups x 15; Green theraband 2 x 10 bilat shoulder rows, shoulder ext, horizontal abd.   Quadruped: Cat and Cow stretch x 12 Neuromuscular Re-education: Manual Therapy: Therapeutic Activity: Self Care: Trigger Point Dry Needling:  Modalities:  E- stim and heat on pt's supraspinatus and thoracic para- spinal muscles bilaterally for pain modulation. (IFC x 10 minutes current to pt. tolerance)     PATIENT EDUCATION: Education details: HEP  Person educated: Patient Education method: Explanation, Demonstration, and Handouts Education comprehension: verbalized understanding and needs further  education     HOME EXERCISE PROGRAM: Access Code: [email or web address] URL: [email or web address] Date: 12/10/2021 Prepared by: Elsie Ra   Exercises Seated Thoracic Lumbar Extension - 2  x daily - 6 x weekly - 3 sets - 10 reps Sidelying Thoracic Rotation with Open Book - 2 x daily - 6 x weekly - 1 sets - 10 reps - 10 sec hold Cat Cow - 2 x daily - 6 x weekly - 1 sets - 10 reps - 5 hold Standing Shoulder Horizontal Abduction with Resistance - 2 x daily - 6 x weekly - 2 sets - 10 reps lumbar-thoracic extension mobilization with movement using towel - 2 x daily - 6 x weekly - 2 sets - 10 reps - 5 hold Single Arm Shoulder Extension with Anchored Resistance - 2 x daily - 6 x weekly - 1-2 sets - 10 reps       ASSESSMENT:   ASSESSMENT:   CLINICAL IMPRESSION: She has made some progress in her shoulder strength and scapular stabilty, see updated measurements above. We will continue to work to improve this with lumbar-thoracic mobility as still with some deficits in these areas. She shows good response from TENS therapy and requested that today.   REHAB POTENTIAL: Good   CLINICAL DECISION MAKING: stable/uncomplicated   EVALUATION COMPLEXITY: Low     GOALS: Short term PT Goals (target date for Short term goals are 4 weeks 01/07/22) Pt will be I and compliant with HEP. Baseline:  Goal status: on- going Pt will decrease pain by 25% overall Baseline: Goal status:  on- going   Long term PT goals (target dates for all long term goals are 8 weeks 01/21/22) Pt will improve shoulder strength to at least 4+/5 MMT to improve functional strength Baseline: Goal status:  on- going Pt will reduce pain to overall less than 2-3/10 with usual activity and work activity. Baseline: Goal status:  on- going Maintain 85% FOTO score  Baseline: Goal status:  on- going   PLAN: PT FREQUENCY: 1 times per week    PT DURATION: 6 weeks   PLANNED INTERVENTIONS (unless contraindicated): aquatic PT,  cryotherapy, Electrical stimulation, Iontophoresis with 4 mg/ml dexamethasome, Moist heat, traction, Ultrasound, gait training, Therapeutic exercise, balance training, neuromuscular re-education, patient/family education, , manual techniques, passive ROM, dry needling, taping, vasopnuematic device, spinal manipulations, joint manipulations   PLAN FOR NEXT SESSION: DN?, Continue to progress thoracic ROM and strengthening exercises   Elsie Ra, PT, DPT 01/01/22 1:23 PM

## 2022-01-06 ENCOUNTER — Other Ambulatory Visit (HOSPITAL_COMMUNITY): Payer: Self-pay

## 2022-01-07 ENCOUNTER — Ambulatory Visit (INDEPENDENT_AMBULATORY_CARE_PROVIDER_SITE_OTHER): Payer: No Typology Code available for payment source | Admitting: Family Medicine

## 2022-01-07 ENCOUNTER — Other Ambulatory Visit (HOSPITAL_COMMUNITY): Payer: Self-pay

## 2022-01-07 ENCOUNTER — Encounter (INDEPENDENT_AMBULATORY_CARE_PROVIDER_SITE_OTHER): Payer: Self-pay | Admitting: Family Medicine

## 2022-01-07 ENCOUNTER — Other Ambulatory Visit: Payer: Self-pay

## 2022-01-07 VITALS — BP 128/80 | HR 68 | Temp 97.8°F | Ht 65.0 in | Wt 254.0 lb

## 2022-01-07 DIAGNOSIS — Z9189 Other specified personal risk factors, not elsewhere classified: Secondary | ICD-10-CM

## 2022-01-07 DIAGNOSIS — E669 Obesity, unspecified: Secondary | ICD-10-CM | POA: Diagnosis not present

## 2022-01-07 DIAGNOSIS — I10 Essential (primary) hypertension: Secondary | ICD-10-CM | POA: Diagnosis not present

## 2022-01-07 DIAGNOSIS — Z6841 Body Mass Index (BMI) 40.0 and over, adult: Secondary | ICD-10-CM

## 2022-01-07 DIAGNOSIS — E559 Vitamin D deficiency, unspecified: Secondary | ICD-10-CM | POA: Diagnosis not present

## 2022-01-07 DIAGNOSIS — R7303 Prediabetes: Secondary | ICD-10-CM

## 2022-01-07 MED ORDER — WEGOVY 0.25 MG/0.5ML ~~LOC~~ SOAJ
0.2500 mg | SUBCUTANEOUS | 0 refills | Status: DC
  Filled 2022-01-07 – 2022-01-10 (×3): qty 2, 28d supply, fill #0

## 2022-01-07 MED ORDER — VITAMIN D (ERGOCALCIFEROL) 1.25 MG (50000 UNIT) PO CAPS
50000.0000 [IU] | ORAL_CAPSULE | ORAL | 0 refills | Status: DC
  Filled 2022-01-07: qty 4, 28d supply, fill #0

## 2022-01-08 ENCOUNTER — Encounter: Payer: Self-pay | Admitting: Physical Therapy

## 2022-01-09 ENCOUNTER — Other Ambulatory Visit (HOSPITAL_COMMUNITY): Payer: Self-pay

## 2022-01-09 NOTE — Progress Notes (Signed)
? ? ? ?Chief Complaint:  ? ?OBESITY ?Denise Austin is here to discuss her progress with her obesity treatment plan along with follow-up of her obesity related diagnoses. Denise Austin is on the Category 3 Plan and states she is following her eating plan approximately 60% of the time. Denise Austin states she is walking for 30 minutes 5 times per week, and exercising with Youtube videos for 20 minutes 4 times per week. ? ?Today's visit was #: 2 ?Starting weight: 251 lbs ?Starting date: 12/24/2021 ?Today's weight: 254 lbs ?Today's date: 01/07/2022 ?Total lbs lost to date: 0 ?Total lbs lost since last in-office visit: 0 ? ?Interim History: Denise Austin voices the first 2-3 days were on the plan then it got harder to stay on. She is not hungry when she was following the plan. She did find dinner to be the most difficult part of the plan. She would like options to eat what everyone else ate. She did find herself gravitating toward carbs.  ? ?Subjective:  ? ?1. Essential hypertension ?Denise Austin's blood pressure is controlled today. She denies chest pain, chest pressure, or headache. She is on Cozaar and Toprol. ? ?2. Vitamin D deficiency ?Denise Austin is not on Vit D, and she notes fatigue. ? ?3. Pre-diabetes ?Denise Austin's A1c is 6.2 and insulin 11.6. I discussed medication options with the patient today. ? ?4. At risk of diabetes mellitus ?Denise Austin is at higher than average risk for developing diabetes due to her obesity. ? ?Assessment/Plan:  ? ?1. Essential hypertension ?Denise Austin will continue her current medications, with no change in doses. If her blood pressure continues to be controlled, we will consider a decrease in medications. ? ?2. Vitamin D deficiency ?Denise Austin agreed to start prescription Vitamin D 50,000 IU weekly with no refills.  ? ?- Vitamin D, Ergocalciferol, (DRISDOL) 1.25 MG (50000 UNIT) CAPS capsule; Take 1 capsule (50,000 Units total) by mouth every 7 (seven) days.  Dispense: 4 capsule; Refill: 0 ? ?3.  Pre-diabetes ?Denise Austin will start her GLP-1, and we will follow up at her next appointment. ? ?4. At risk of diabetes mellitus ?Denise Austin was given approximately 30 minutes of diabetic education and counseling today. We discussed intensive lifestyle modifications today with an emphasis on weight loss as well as increasing exercise and decreasing simple carbohydrates in her diet. We also reviewed medication options with an emphasis on risk versus benefits of those discussed. ? ?Repetitive spaced learning was employed today to elicit superior memory formation and behavioral change. ? ?5. Obesity with current BMI of 42.3 ?Denise Austin is currently in the action stage of change. As such, her goal is to continue with weight loss efforts. She has agreed to the Category 3 Plan.  ? ?We discussed various medication options to help Denise Austin with her weight loss efforts and we both agreed to start Wegovy 0.25 mg SubQ weekly with no refills. Risks and benefits were discussed. She denies a history of pancreatitis, thyroid cancer MEN type 2. ? ?- Semaglutide-Weight Management (WEGOVY) 0.25 MG/0.5ML SOAJ; Inject 0.25 mg into the skin once a week.  Dispense: 2 mL; Refill: 0 ? ?Exercise goals: All adults should avoid inactivity. Some physical activity is better than none, and adults who participate in any amount of physical activity gain some health benefits. ? ?Behavioral modification strategies: increasing lean protein intake, meal planning and cooking strategies, keeping healthy foods in the home, and ways to avoid night time snacking. ? ?Denise Austin has agreed to follow-up with our clinic in 2 weeks. She was informed of the importance of frequent  follow-up visits to maximize her success with intensive lifestyle modifications for her multiple health conditions.  ? ?Objective:  ? ?Blood pressure 128/80, pulse 68, temperature 97.8 ?F (36.6 ?C), height '5\' 5"'$  (1.651 m), weight 254 lb (115.2 kg), last menstrual period 04/02/2018, SpO2 99  %. ?Body mass index is 42.27 kg/m?. ? ?General: Cooperative, alert, well developed, in no acute distress. ?HEENT: Conjunctivae and lids unremarkable. ?Cardiovascular: Regular rhythm.  ?Lungs: Normal work of breathing. ?Neurologic: No focal deficits.  ? ?Lab Results  ?Component Value Date  ? CREATININE 0.89 12/24/2021  ? BUN 21 12/24/2021  ? NA 140 12/24/2021  ? K 4.3 12/24/2021  ? CL 104 12/24/2021  ? CO2 24 12/24/2021  ? ?Lab Results  ?Component Value Date  ? ALT 13 12/24/2021  ? AST 14 12/24/2021  ? ALKPHOS 90 12/24/2021  ? BILITOT 0.5 12/24/2021  ? ?Lab Results  ?Component Value Date  ? HGBA1C 6.2 (H) 12/24/2021  ? HGBA1C 6.3 (H) 09/13/2021  ? HGBA1C 6.1 (H) 01/25/2021  ? HGBA1C 5.9 01/22/2018  ? HGBA1C 5.7 01/16/2017  ? ?Lab Results  ?Component Value Date  ? INSULIN 11.6 12/24/2021  ? ?Lab Results  ?Component Value Date  ? TSH 1.050 12/24/2021  ? ?Lab Results  ?Component Value Date  ? CHOL 136 12/24/2021  ? HDL 50 12/24/2021  ? [employer] 72 12/24/2021  ? TRIG 72 12/24/2021  ? CHOLHDL 2.7 09/13/2021  ? ?Lab Results  ?Component Value Date  ? VD25OH 29.4 (L) 12/24/2021  ? ?Lab Results  ?Component Value Date  ? WBC 6.8 12/24/2021  ? HGB 13.7 12/24/2021  ? HCT 41.4 12/24/2021  ? MCV 92 12/24/2021  ? PLT 291 12/24/2021  ? ?No results found for: IRON, TIBC, FERRITIN ? ?Attestation Statements:  ? ?Reviewed by clinician on day of visit: allergies, medications, problem list, medical history, surgical history, family history, social history, and previous encounter notes. ? ? ?I, Trixie Dredge, am acting as transcriptionist for Coralie Common, MD. ? ?I have reviewed the above documentation for accuracy and completeness, and I agree with the above. Coralie Common, MD ? ? ?

## 2022-01-10 ENCOUNTER — Other Ambulatory Visit: Payer: Self-pay | Admitting: Orthopaedic Surgery

## 2022-01-10 ENCOUNTER — Other Ambulatory Visit (HOSPITAL_COMMUNITY): Payer: Self-pay

## 2022-01-10 MED ORDER — PREDNISONE 10 MG (21) PO TBPK
ORAL_TABLET | ORAL | 3 refills | Status: DC
  Filled 2022-01-10: qty 21, 6d supply, fill #0

## 2022-01-15 ENCOUNTER — Encounter: Payer: Self-pay | Admitting: Physical Therapy

## 2022-01-22 ENCOUNTER — Ambulatory Visit: Payer: No Typology Code available for payment source | Admitting: Physical Therapy

## 2022-01-22 ENCOUNTER — Encounter: Payer: Self-pay | Admitting: Physical Therapy

## 2022-01-22 DIAGNOSIS — M5441 Lumbago with sciatica, right side: Secondary | ICD-10-CM | POA: Diagnosis not present

## 2022-01-22 DIAGNOSIS — M546 Pain in thoracic spine: Secondary | ICD-10-CM

## 2022-01-22 DIAGNOSIS — M6281 Muscle weakness (generalized): Secondary | ICD-10-CM

## 2022-01-22 NOTE — Therapy (Signed)
? ?OUTPATIENT PHYSICAL THERAPY TREATMENT NOTE ? ? ?Patient Name: Denise Austin ?MRN: [id number] ?DOB:[birth year], 51 y.o., female ?Today's Date: 01/22/2022 ? ?PCP: Debbrah Alar, NP ?REFERRING PROVIDER: Wallace Going, DO ? ? PT End of Session - 01/22/22 1328   ? ? Visit Number 4   ? Number of Visits 6   ? Date for PT Re-Evaluation 01/21/22   ? PT Start Time 1300   ? PT Stop Time 1340   ? PT Time Calculation (min) 40 min   ? Activity Tolerance Patient tolerated treatment well   ? ?  ?  ? ?  ? ? ? ?Past Medical History:  ?Diagnosis Date  ? Allergy   ? GERD (gastroesophageal reflux disease)   ? Hyperglycemia   ? Hypertension   ? OSA (obstructive sleep apnea) 04/15/2018  ? PONV (postoperative nausea and vomiting)   ? Sleep apnea   ? wears cpap   ? Uterine fibroid   ? ?Past Surgical History:  ?Procedure Laterality Date  ? BUNIONECTOMY    ? Lt-01/07/06, Rt-09/15/07  ? CHOLECYSTECTOMY    ? CYST REMOVAL HAND    ? Left Wrist  ? LUMBAR LAMINECTOMY/DECOMPRESSION MICRODISCECTOMY Right 04/22/2017  ? Procedure: Extraforaminal Microdiscectomy - Lumbar two-Lumbar three - right;  Surgeon: Eustace Moore, MD;  Location: [facility];  Service: Neurosurgery;  Laterality: Right;  ? lumbar surgery revision  04/24/2017  ? WOUND EXPLORATION N/A 05/22/2017  ? Procedure: Revision Of Lumbar Wound;  Surgeon: Eustace Moore, MD;  Location: [place];  Service: Neurosurgery;  Laterality: N/A;  Lumbar wound revision  ? ?Patient Active Problem List  ? Diagnosis Date Noted  ? Symptomatic mammary hypertrophy 12/02/2021  ? Back pain 12/02/2021  ? Neck pain 12/02/2021  ? Morbid obesity due to excess calories ([place]) 04/21/2019  ? OSA (obstructive sleep apnea) 04/15/2018  ? Herpes simplex viral infection 07/10/2017  ? S/P lumbar laminectomy 04/22/2017  ? GERD (gastroesophageal reflux disease) 07/25/2016  ? Hyperglycemia 07/25/2016  ? HTN (hypertension) 12/24/2015  ? Preventative health care 10/16/2014  ? [place] arthritis 07/06/2014  ? S/P excision of  ganglion cyst 07/06/2014  ? Synovitis 07/06/2014  ? ? ?PCP: Debbrah Alar, NP ?  ?REFERRING PROVIDER: Wallace Going, DO ?  ?REFERRING DIAG:  ?Z98.890 (ICD-10-CM) - S/P lumbar laminectomy  ?N62 (ICD-10-CM) - Symptomatic mammary hypertrophy  ?M54.6,G89.29 (ICD-10-CM) - Chronic bilateral thoracic back pain  ?M54.2 (ICD-10-CM) - Neck pain  ?  ?  ?THERAPY DIAG:  ?Pain in thoracic spine M54.6 ?Muscle weakness M62.8 ?  ?ONSET DATE: 6+ months ago ?  ?SUBJECTIVE:                                                                                                                                                                                          ?  ?  SUBJECTIVE STATEMENT: ?Pt states that she had one bad episode of sharp thoracic pains and does not know what casusd this. She saw MD for this and was given prednisone which helped a lot. Only has mild pain today ? ?PERTINENT HISTORY:  S/P lumbar laminectomy, HTN (hypertension),  Hyperglycemia, obesity  ?  ?PAIN:  ?Are you having pain? Yes ?NPRS scale: 2/10 ?Pain location: Bilat. Neck/ Shoulder area ?Pain orientation: Right and Left  ?PAIN TYPE: aching and tight ?Pain description: stabbing and aching  ?Aggravating factors: Lifting ?Relieving factors: Advil, Heat, Gabapentin ?   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  ?PLOF: Independent ?  ?PATIENT GOALS: Decrease pain, attempt physical therapy before having breast reduction surgery ?  ?  ?OBJECTIVE:  ?PATIENT SURVEYS:  ?FOTO 85%        ?  ?UPPER EXTREMITY AROM/PROM: ?          All Shoulder ROM full, there was pain with L shoulder abduction and flexion at end range. Neck ROM full and painfree ?(Blank rows = not tested) ?  ?UPPER EXTREMITY MMT: ?  ?MMT Right ?12/11/2021 Left ?12/11/2021 Right ?01/01/22 Left ?01/01/22  ?Shoulder flexion 4+/5 4+/5 4+ 4+  ?Shoulder extension 3+/5 3+/5    ?Shoulder abduction 4/5 4/5 4+ 4+  ?Shoulder internal rotation Measure at next visit Measure at next visit 5 5  ?Shoulder  external rotation Measure at next visit Measure at next visit 5 5  ?Middle trapezius 5/5 5/5    ?Lower trapezius 4/5 4/5    ?Rhomboids 4+/5 4+/5    ?(Blank rows = not tested) ?  ?JOINT MOBILITY TESTING:  ?CPA's at T3-T6 spinal level show hypomobility with concordant pain  ?  ?PALPATION: ?Tenderness to palpation at bilat supraspinatus area, upper trapezius and rhomboids. ?           ?TODAY'S TREATMENT:  ?01/22/2022: ? Therapeutic Exercise: ? Aerobic:   ?Supine: ?Sidelying:  ? Seated:pball roll outs into lumbar flexion/throacic extensionfwd and diagonals 10 sec X 5 ? Standing: Green theraband X 15 alternating unilat shoulder rows and bilat shoulder extensions. Green band for bilat horizontal abd and ER  X15 ea. UE red ball roll up wall ? Quadruped: Cat and Cow stretch x 10 holding 5 sec, thread the needle stretch (horizontal add moving into H abd) 5 sec X 10 bilat. ? ?Modalities: Modalities:  E- stim and heat bilat  thoracic paraspinals for pain modulation. (IFC x 15 minutes current to pt. tolerance) ? ?01/01/2022: ? Therapeutic Exercise: ? Aerobic: UBE lvl 5, 3 min forward/ 3 min backward  ?Supine: ?Sidelying:  ? Seated:pball roll outs into lumbar flexion/throacic extensionfwd and diagonals 10 sec X 5 ? Standing: Green theraband X 15 alternating unilat shoulder rows and shoulder ext. Green band for bilat horizontal abd and ER  X15 ea ? Quadruped: Cat and Cow stretch x 10 holding 5 sec ?Neuromuscular Re-education: ?Manual Therapy: ?Therapeutic Activity: ?Self Care: ?Trigger Point Dry Needling:  ?Modalities:  E- stim and heat bilat  thoracic paraspinals for pain modulation. (IFC x 10 minutes current to pt. tolerance) ?  ?PATIENT EDUCATION: ?Education details: HEP  ?Person educated: Patient ?Education method: Explanation, Demonstration, and Handouts ?Education comprehension: verbalized understanding and needs further education ?  ?  ?HOME EXERCISE PROGRAM: ?Access Code: [email or web address] ?URL:  [email or web address] ?Date: 12/10/2021 ?Prepared by: Elsie Ra ?  ?Exercises ?Seated Thoracic Lumbar Extension - 2 x daily - 6 x weekly - 3 sets - 10 reps ?Sidelying Thoracic Rotation with Open Book - 2  x daily - 6 x weekly - 1 sets - 10 reps - 10 sec hold ?Cat Cow - 2 x daily - 6 x weekly - 1 sets - 10 reps - 5 hold ?Standing Shoulder Horizontal Abduction with Resistance - 2 x daily - 6 x weekly - 2 sets - 10 reps ?lumbar-thoracic extension mobilization with movement using towel - 2 x daily - 6 x weekly - 2 sets - 10 reps - 5 hold ?Single Arm Shoulder Extension with Anchored Resistance - 2 x daily - 6 x weekly - 1-2 sets - 10 reps ?  ?  ?  ?ASSESSMENT: ?  ?ASSESSMENT: ?  ?CLINICAL IMPRESSION: ?She had set back with episode of sharp pains in her thoracic so she missed some PT and took prednisone which was recommended by MD. Her pain has now calmed down some so we as able to continue to work on thoracic strengthening and mobility today with good tolerance. We did use Estim and heat post treatment to further reduce pain and muscle tension.  ?  ?REHAB POTENTIAL: Good ?  ?CLINICAL DECISION MAKING: stable/uncomplicated ?  ?EVALUATION COMPLEXITY: Low ?  ?  ?GOALS: ?Short term PT Goals (target date for Short term goals are 4 weeks 01/07/22) ?Pt will be I and compliant with HEP. ?Baseline:  ?Goal status: on- going ?Pt will decrease pain by 25% overall ?Baseline: ?Goal status:  on- going ?  ?Long term PT goals (target dates for all long term goals are 8 weeks 01/21/22) ?Pt will improve shoulder strength to at least 4+/5 MMT to improve functional strength ?Baseline: ?Goal status:  on- going ?Pt will reduce pain to overall less than 2-3/10 with usual activity and work activity. ?Baseline: ?Goal status:  on- going ?Maintain 85% FOTO score  ?Baseline: ?Goal status:  on- going ?  ?PLAN: ?PT FREQUENCY: 1 times per week  ?  ?PT DURATION: 6 weeks ?  ?PLANNED INTERVENTIONS (unless contraindicated): aquatic PT,  cryotherapy, Electrical stimulation, Iontophoresis with 4 mg/ml dexamethasome, Moist heat, traction, Ultrasound, gait training, Therapeutic exercise, balance training, neuromuscular re-education, patient/family education, , manual techniques, passive

## 2022-01-24 ENCOUNTER — Encounter (INDEPENDENT_AMBULATORY_CARE_PROVIDER_SITE_OTHER): Payer: Self-pay | Admitting: Family Medicine

## 2022-01-25 ENCOUNTER — Emergency Department (HOSPITAL_BASED_OUTPATIENT_CLINIC_OR_DEPARTMENT_OTHER)
Admission: EM | Admit: 2022-01-25 | Discharge: 2022-01-25 | Disposition: A | Discharge status: Home / Self Care | Payer: Self-pay | Attending: Emergency Medicine | Admitting: Emergency Medicine

## 2022-01-25 ENCOUNTER — Emergency Department (HOSPITAL_BASED_OUTPATIENT_CLINIC_OR_DEPARTMENT_OTHER): Payer: Self-pay

## 2022-01-25 ENCOUNTER — Other Ambulatory Visit: Payer: Self-pay

## 2022-01-25 DIAGNOSIS — M545 Low back pain, unspecified: Secondary | ICD-10-CM | POA: Insufficient documentation

## 2022-01-25 DIAGNOSIS — M25552 Pain in left hip: Secondary | ICD-10-CM | POA: Insufficient documentation

## 2022-01-25 DIAGNOSIS — Y92511 Restaurant or cafe as the place of occurrence of the external cause: Secondary | ICD-10-CM | POA: Insufficient documentation

## 2022-01-25 DIAGNOSIS — Y9301 Activity, walking, marching and hiking: Secondary | ICD-10-CM | POA: Insufficient documentation

## 2022-01-25 DIAGNOSIS — W19XXXA Unspecified fall, initial encounter: Secondary | ICD-10-CM

## 2022-01-25 DIAGNOSIS — W010XXA Fall on same level from slipping, tripping and stumbling without subsequent striking against object, initial encounter: Secondary | ICD-10-CM | POA: Insufficient documentation

## 2022-01-25 DIAGNOSIS — M25562 Pain in left knee: Secondary | ICD-10-CM | POA: Insufficient documentation

## 2022-01-25 LAB — PREGNANCY, URINE: Preg Test, Ur: NEGATIVE

## 2022-01-25 MED ORDER — KETOROLAC TROMETHAMINE 15 MG/ML IJ SOLN
15.0000 mg | Freq: Once | INTRAMUSCULAR | Status: AC
  Administered 2022-01-25: 15 mg via INTRAMUSCULAR
  Filled 2022-01-25: qty 1

## 2022-01-25 NOTE — ED Triage Notes (Signed)
Patient was at [facility] and fell from a standing position. Patient was walking towards table and fell on her left side and knee. Patient has pain in her knee and side of thigh area. Patient was able to ambulate to the triage waiting room. Patient does not take any blood thinners.  ?

## 2022-01-25 NOTE — Discharge Instructions (Signed)
Take 4 over the counter ibuprofen tablets 3 times a day or 2 over-the-counter naproxen tablets twice a day for pain. ?Also take tylenol '1000mg'$ (2 extra strength) four times a day.  ? ? ?Your x rays did not show any broken bones.  ?

## 2022-01-25 NOTE — ED Notes (Signed)
Patient transported to X-ray 

## 2022-01-25 NOTE — ED Provider Notes (Signed)
?[facility] ?Provider Note ? ? ?CSN: [id number] ?Arrival date & time: 01/25/22  2123 ? ?  ? ?History ? ?Chief Complaint  ?Patient presents with  ? Fall  ? ? ?Denise Austin is a 51 y.o. female. ? ?51 yo F with a chief complaint of a fall.  The patient was at a restaurant and she was walking to her table with her food and hand and she had slipped on a wet spot on the floor and landed on her left side.  Complaining mostly of pain to the left knee and hip.  She points to her left buttock as area of pain.  She has been able to walk since then.  Denies head injury denies loss consciousness denies neck pain back pain chest pain abdominal pain. ? ? ?Fall ? ? ?  ? ?Home Medications ?Prior to Admission medications   ?Medication Sig Start Date End Date Taking? Authorizing Provider  ?predniSONE (STERAPRED UNI-PAK 21 TAB) 10 MG (21) TBPK tablet Take as directed 01/10/22   Leandrew Koyanagi, MD  ?BIOTIN PO Take 1 tablet by mouth daily. Gummy vitamin    [provider]  ?gabapentin (NEURONTIN) 300 MG capsule Take 1 capsule (300 mg total) by mouth 2 (two) times daily. 12/10/21   Suzan Slick, NP  ?losartan (COZAAR) 50 MG tablet Take 1 tablet (50 mg total) by mouth daily. 09/13/21   Debbrah Alar, NP  ?metoprolol succinate (TOPROL-XL) 25 MG 24 hr tablet Take 3 tablets (75 mg total) by mouth daily. ?Patient taking differently: Take 25 mg by mouth daily. 09/13/21   Debbrah Alar, NP  ?omeprazole (PRILOSEC) 40 MG capsule Take 1 capsule (40 mg total) by mouth daily as needed. 09/13/21 09/13/22  Debbrah Alar, NP  ?Semaglutide-Weight Management (WEGOVY) 0.25 MG/0.5ML SOAJ Inject 0.25 mg into the skin once a week. 01/07/22   Laqueta Linden, MD  ?valACYclovir (VALTREX) 500 MG tablet Take 1 tablet (500 mg total) by mouth daily. 09/13/21   Debbrah Alar, NP  ?Vitamin D, Ergocalciferol, (DRISDOL) 1.25 MG (50000 UNIT) CAPS capsule Take 1 capsule (50,000 Units total) by mouth  every 7 (seven) days. 01/07/22   Laqueta Linden, MD  ?   ? ?Allergies    ?Patient has no active allergies.   ? ?Review of Systems   ?Review of Systems ? ?Physical Exam ?Updated Vital Signs ?BP 130/87 (BP Location: Right Arm)   Pulse 81   Temp 98.1 ?F (36.7 ?C) (Oral)   Resp 18   Ht '5\' 5"'$  (1.651 m)   Wt 113.9 kg   LMP 04/02/2018   SpO2 93%   BMI 41.77 kg/m?  ?Physical Exam ?Vitals and nursing note reviewed.  ?Constitutional:   ?   General: She is not in acute distress. ?   Appearance: She is well-developed. She is not diaphoretic.  ?HENT:  ?   Head: Normocephalic and atraumatic.  ?Eyes:  ?   Pupils: Pupils are equal, round, and reactive to light.  ?Cardiovascular:  ?   Rate and Rhythm: Normal rate and regular rhythm.  ?   Heart sounds: No murmur heard. ?  No friction rub. No gallop.  ?Pulmonary:  ?   Effort: Pulmonary effort is normal.  ?   Breath sounds: No wheezing or rales.  ?Abdominal:  ?   General: There is no distension.  ?   Palpations: Abdomen is soft.  ?   Tenderness: There is no abdominal tenderness.  ?Musculoskeletal:     ?  General: No tenderness.  ?   Cervical back: Normal range of motion and neck supple.  ?   Comments: Some pain with range of motion of the left knee without obvious ligamentous laxity or pain with McMurray's though difficult due to pain.  No pain with internal and external rotation of the hip.  No midline spinal tenderness step-offs or deformities.  Mild pain along the lateral aspect of the buttock.  Pulse motor and sensation intact distally.  ?Skin: ?   General: Skin is warm and dry.  ?Neurological:  ?   Mental Status: She is alert and oriented to person, place, and time.  ?Psychiatric:     ?   Behavior: Behavior normal.  ? ? ?ED Results / Procedures / Treatments   ?Labs ?(all labs ordered are listed, but only abnormal results are displayed) ?Labs Reviewed  ?PREGNANCY, URINE  ? ? ?EKG ?None ? ?Radiology ?DG Knee Complete 4 Views Left ? ?Result Date: 01/25/2022 ?CLINICAL  DATA:  Fall, left knee pain EXAM: LEFT KNEE - COMPLETE 4+ VIEW COMPARISON:  None. FINDINGS: No evidence of fracture, dislocation, or joint effusion. No evidence of arthropathy or other focal bone abnormality. Soft tissues are unremarkable. IMPRESSION: Negative. Electronically Signed   By: Rolm Baptise M.D.   On: 01/25/2022 22:42  ? ?DG Hip Unilat With Pelvis 2-3 Views Left ? ?Result Date: 01/25/2022 ?CLINICAL DATA:  Fall, left knee and hip pain EXAM: DG HIP (WITH OR WITHOUT PELVIS) 2-3V LEFT COMPARISON:  None. FINDINGS: There is no evidence of hip fracture or dislocation. There is no evidence of arthropathy or other focal bone abnormality. IMPRESSION: Negative. Electronically Signed   By: Rolm Baptise M.D.   On: 01/25/2022 22:41   ? ?Procedures ?Procedures  ? ? ?Medications Ordered in ED ?Medications  ?ketorolac (TORADOL) 15 MG/ML injection 15 mg (15 mg Intramuscular Given 01/25/22 2212)  ? ? ?ED Course/ Medical Decision Making/ A&P ?  ?                        ?Medical Decision Making ?Amount and/or Complexity of Data Reviewed ?Labs: ordered. ?Radiology: ordered. ? ?Risk ?Prescription drug management. ? ? ?51 yo F with a chief complaint of a fall.  Patient slipped on the floor and landed on her left buttock.  We will obtain a plain film to evaluate for fracture. ? ?Plain film of the left hip independently interpreted by me without fracture or dislocation.  Plain film of the left knee independently interpreted by me without fracture will place in a knee immobilizer.  Crutches.  PCP follow-up. ? ?10:46 PM:  I have discussed the diagnosis/risks/treatment options with the patient.  Evaluation and diagnostic testing in the emergency department does not suggest an emergent condition requiring admission or immediate intervention beyond what has been performed at this time.  They will follow up with  PCP. We also discussed returning to the ED immediately if new or worsening sx occur. We discussed the sx which are most  concerning (e.g., sudden worsening pain, fever, inability to tolerate by mouth) that necessitate immediate return. Medications administered to the patient during their visit and any new prescriptions provided to the patient are listed below. ? ?Medications given during this visit ?Medications  ?ketorolac (TORADOL) 15 MG/ML injection 15 mg (15 mg Intramuscular Given 01/25/22 2212)  ? ? ? ?The patient appears reasonably screen and/or stabilized for discharge and I doubt any other medical condition or other [facility] requiring further screening,  evaluation, or treatment in the ED at this time prior to discharge.  ? ? ? ? ? ? ? ? ?Final Clinical Impression(s) / ED Diagnoses ?Final diagnoses:  ?Fall, initial encounter  ?Acute pain of left knee  ?Left hip pain  ? ? ?Rx / DC Orders ?ED Discharge Orders   ? ? None  ? ?  ? ? ?  ?Deno Etienne, DO ?01/25/22 2246 ? ?

## 2022-01-27 ENCOUNTER — Ambulatory Visit (INDEPENDENT_AMBULATORY_CARE_PROVIDER_SITE_OTHER): Payer: No Typology Code available for payment source | Admitting: Family Medicine

## 2022-01-30 ENCOUNTER — Ambulatory Visit (INDEPENDENT_AMBULATORY_CARE_PROVIDER_SITE_OTHER): Payer: Self-pay | Admitting: Surgery

## 2022-01-30 ENCOUNTER — Other Ambulatory Visit (HOSPITAL_COMMUNITY): Payer: Self-pay

## 2022-01-30 ENCOUNTER — Encounter (INDEPENDENT_AMBULATORY_CARE_PROVIDER_SITE_OTHER): Payer: Self-pay | Admitting: Family Medicine

## 2022-01-30 ENCOUNTER — Encounter: Payer: Self-pay | Admitting: Surgery

## 2022-01-30 ENCOUNTER — Ambulatory Visit (INDEPENDENT_AMBULATORY_CARE_PROVIDER_SITE_OTHER): Payer: No Typology Code available for payment source | Admitting: Family Medicine

## 2022-01-30 VITALS — BP 125/89 | HR 74 | Temp 97.6°F | Ht 65.0 in | Wt 250.0 lb

## 2022-01-30 DIAGNOSIS — Z9189 Other specified personal risk factors, not elsewhere classified: Secondary | ICD-10-CM

## 2022-01-30 DIAGNOSIS — Z6841 Body Mass Index (BMI) 40.0 and over, adult: Secondary | ICD-10-CM

## 2022-01-30 DIAGNOSIS — E66813 Obesity, class 3: Secondary | ICD-10-CM

## 2022-01-30 DIAGNOSIS — E669 Obesity, unspecified: Secondary | ICD-10-CM

## 2022-01-30 DIAGNOSIS — E559 Vitamin D deficiency, unspecified: Secondary | ICD-10-CM | POA: Diagnosis not present

## 2022-01-30 DIAGNOSIS — I10 Essential (primary) hypertension: Secondary | ICD-10-CM | POA: Diagnosis not present

## 2022-01-30 DIAGNOSIS — S7002XA Contusion of left hip, initial encounter: Secondary | ICD-10-CM

## 2022-01-30 DIAGNOSIS — W010XXA Fall on same level from slipping, tripping and stumbling without subsequent striking against object, initial encounter: Secondary | ICD-10-CM

## 2022-01-30 MED ORDER — VITAMIN D (ERGOCALCIFEROL) 1.25 MG (50000 UNIT) PO CAPS
50000.0000 [IU] | ORAL_CAPSULE | ORAL | 0 refills | Status: DC
  Filled 2022-01-30 – 2022-02-06 (×2): qty 4, 28d supply, fill #0

## 2022-01-30 MED ORDER — WEGOVY 0.25 MG/0.5ML ~~LOC~~ SOAJ
0.2500 mg | SUBCUTANEOUS | 0 refills | Status: DC
  Filled 2022-01-30 – 2022-02-06 (×2): qty 2, 28d supply, fill #0

## 2022-01-30 NOTE — Progress Notes (Signed)
? ?Office Visit Note ?  ?Patient: Denise Austin           ?Date of Birth: 10/19/1971           ?MRN: [id number] ?Visit Date: 01/30/2022 ?             ?Requested by: Debbrah Alar, NP ?[address] ?[place],  [place] [address] ?PCP: Debbrah Alar, NP ? ? ?Assessment & Plan: ?Visit Diagnoses:  ?1. Contusion of left hip, initial encounter   ?2. Fall due to wet surface at restaurant January 25, 2022     ? ? ?Plan: Advised patient that I believe her pain in the hip and thigh area is from a contusion from her fall at the restaurant.  Unfortunately do not think that a greater trochanter bursa Marcaine/Depo-Medrol injection would be of any great benefit at this time.  Recommend conservative management with rest and ice off-and-on as needed.  No aggressive activity.  Recheck in a couple weeks to see how things are going.  If she has more localized tenderness over the bursa at that time I may consider injection there if she is still symptomatic.  All questions answered. ? ?Follow-Up Instructions: Return in about 1 week (around 02/06/2022) for with Elijah Michaelis recheck left hip.  ? ?Orders:  ?No orders of the defined types were placed in this encounter. ? ?No orders of the defined types were placed in this encounter. ? ? ? ? Procedures: ?No procedures performed ? ? ?Clinical Data: ?No additional findings. ? ? ?Subjective: ?Chief Complaint  ?Patient presents with  ? Left Hip - Follow-up  ? ? 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  Patient states that January 25, 2022 she was in [employer] and was walking to her table with food in hand when she slipped on a wet spot on the floor landing on her left hip and buttock area.  Patient states that there were not any signs alerting that the floor was wet.  Patient had immediate pain and swelling lateral hip and thigh area.  She did go to the emergency room at [facility] for  evaluation.  She did have x-rays and that report showed: ? ?CLINICAL DATA:  Fall, left knee and hip pain ?  ?EXAM: ?DG HIP (WITH OR WITHOUT PELVIS) 2-3V LEFT ?  ?COMPARISON:  None. ?  ?FINDINGS: ?There is no evidence of hip fracture or dislocation. There is no ?evidence of arthropathy or other focal bone abnormality. ?  ?IMPRESSION: ?Negative. ?  ?  ?Electronically Signed ?  By: Rolm Baptise M.D. ?  On: 01/25/2022 22:41 ? ?She continues to have ongoing pain throughout her hip and thigh.  Has not had any improvement with activity modification.  Not complaining of any groin pain.  She asked if an injection for the greater trochanter bursa would give any relief. ? ?Review of Systems ?No current cardiopulmonary GI/GU issues ? ?Objective: ?Vital Signs: LMP 04/02/2018  ? ?Physical Exam ?Constitutional:   ?   Appearance: Normal appearance.  ?HENT:  ?   Head: Normocephalic.  ?   Nose: Nose normal.  ?Eyes:  ?   Extraocular Movements: Extraocular movements intact.  ?Pulmonary:  ?   Effort: No respiratory distress.  ?Musculoskeletal:     ?   General: Swelling (On exam patient does have mild to moderate swelling of the lateral left thigh.) and  tenderness (Lateral left thigh around the area of swelling is moderate to marked tender to palpation.) present.  ?   Comments: Gait is antalgic due to left hip pain.  Negative logroll bilateral hips.  Negative straight leg raise.  ?Neurological:  ?   Mental Status: She is alert.  ?Psychiatric:     ?   Mood and Affect: Mood normal.  ? ? ?Ortho Exam ? ?Specialty Comments:  ?No specialty comments available. ? ?Imaging: ?No results found. ? ? ?PMFS History: ?Patient Active Problem List  ? Diagnosis Date Noted  ? Symptomatic mammary hypertrophy 12/02/2021  ? Back pain 12/02/2021  ? Neck pain 12/02/2021  ? Morbid obesity due to excess calories ([place]) 04/21/2019  ? OSA (obstructive sleep apnea) 04/15/2018  ? Herpes simplex viral infection 07/10/2017  ? S/P lumbar laminectomy 04/22/2017  ? GERD  (gastroesophageal reflux disease) 07/25/2016  ? Hyperglycemia 07/25/2016  ? HTN (hypertension) 12/24/2015  ? Preventative health care 10/16/2014  ? [place] arthritis 07/06/2014  ? S/P excision of ganglion cyst 07/06/2014  ? Synovitis 07/06/2014  ? ?Past Medical History:  ?Diagnosis Date  ? Allergy   ? GERD (gastroesophageal reflux disease)   ? Hyperglycemia   ? Hypertension   ? OSA (obstructive sleep apnea) 04/15/2018  ? PONV (postoperative nausea and vomiting)   ? Sleep apnea   ? wears cpap   ? Uterine fibroid   ?  ?Family History  ?Problem Relation Age of Onset  ? Hypertension Mother   ? Diabetes Mother   ? Sleep apnea Mother   ? Obesity Mother   ? Stroke Father   ? Hypertension Father   ?     smoker  ? COPD Father   ? Single kidney Sister   ?     removed as a child  ? Heart attack Neg Hx   ? Hyperlipidemia Neg Hx   ? Sudden death Neg Hx   ? Colon cancer Neg Hx   ? Colon polyps Neg Hx   ? Esophageal cancer Neg Hx   ? Stomach cancer Neg Hx   ? Rectal cancer Neg Hx   ?  ?Past Surgical History:  ?Procedure Laterality Date  ? BUNIONECTOMY    ? Lt-01/07/06, Rt-09/15/07  ? CHOLECYSTECTOMY    ? CYST REMOVAL HAND    ? Left Wrist  ? LUMBAR LAMINECTOMY/DECOMPRESSION MICRODISCECTOMY Right 04/22/2017  ? Procedure: Extraforaminal Microdiscectomy - Lumbar two-Lumbar three - right;  Surgeon: Eustace Moore, MD;  Location: [facility];  Service: Neurosurgery;  Laterality: Right;  ? lumbar surgery revision  04/24/2017  ? WOUND EXPLORATION N/A 05/22/2017  ? Procedure: Revision Of Lumbar Wound;  Surgeon: Eustace Moore, MD;  Location: [facility];  Service: Neurosurgery;  Laterality: N/A;  Lumbar wound revision  ? ?Social History  ? ?Occupational History  ? 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 Smoking status: Never  ? Smokeless tobacco: Never  ?Vaping Use  ? Vaping Use: Never used  ?Substance and Sexual Activity  ? Alcohol use: Yes  ?  Alcohol/week: 0.0 standard drinks  ?  Comment: occasional 1-2 times a month  ? Drug use: No  ? Sexual  activity: Yes  ?  Partners: Male  ?  Birth control/protection: None  ? ? ? ? ? ? ?

## 2022-02-03 NOTE — Progress Notes (Signed)
? ? ? ?Chief Complaint:  ? ?OBESITY ?Denise Austin is here to discuss her progress with her obesity treatment plan along with follow-up of her obesity related diagnoses. Denise Austin is on the Category 3 Plan and states she is following her eating plan approximately 90% of the time. Denise Austin states she is doing 0 minutes 0 times per week. ? ?Today's visit was #: 3 ?Starting weight: 251 lbs ?Starting date: 12/24/2021 ?Today's weight: 250 lbs ?Today's date: 01/30/2022 ?Total lbs lost to date: 1 lb ?Total lbs lost since last in-office visit: 4 lbs ? ?Interim History: Over the last few weeks Denise Austin has been focusing more on food plan and no hunger. She did have a bit of hunger a few days ago but thinks it was because she didin't get enough in the night prior. She is going to the beach for the Spring break. Her breakfast is still difficult.  ? ?Subjective:  ? ?1. Essential hypertension ?Denise Austin 's blood pressure is well controlled today 125/89. She denies chest pains, chest pressure and headaches.  ? ?2. Vitamin D deficiency ?Denise Austin notes no nausea, vomiting, or muscle weakness. She notes fatigue. She is currently on Vitamin D currently.  ? ?3. At risk for deficient intake of food ?Denise Austin is at risk for deficient intake of food due to Denise Austin. ? ?Assessment/Plan:  ? ?1. Essential hypertension ?Denise Austin will continue Cozaar and Toprol XL. She denies change in dose. She is working on healthy weight loss and exercise to improve blood pressure control. We will watch for signs of hypotension as she continues her lifestyle modifications. ? ?2. Vitamin D deficiency ?Low Vitamin D level contributes to fatigue and are associated with obesity, breast, and colon cancer. We will refill  prescription Vitamin D 50,000 IU every week for 1 month with no refills and Denise Austin will follow-up for routine testing of Vitamin D, at least 2-3 times per year to avoid over-replacement. ? ?- Vitamin D, Ergocalciferol, (DRISDOL) 1.25 MG (50000  UNIT) CAPS capsule; Take 1 capsule (50,000 Units total) by mouth every 7 (seven) days.  Dispense: 4 capsule; Refill: 0 ? ?3. At risk for deficient intake of food ?Denise Austin was given approximately 15 minutes of deficient intake of food prevention counseling today. Denise Austin is at risk for eating too few calories based on current food recall. She was encouraged to focus on meeting caloric and protein goals according to her recommended meal plan.  ? ?4. Obesity with current BMI of 41.6 ?Denise Austin is currently in the action stage of change. As such, her goal is to continue with weight loss efforts. She has agreed to the Category 3 Plan.  ? ?We will refill Wegovy 0.25 mg Subcutaneous for 1 week for with no refills.  ? ?- Semaglutide-Weight Management (WEGOVY) 0.25 MG/0.5ML SOAJ; Inject 0.25 mg into the skin once a week.  Dispense: 2 mL; Refill: 0 ? ?Exercise goals: No exercise has been prescribed at this time. ? ?Behavioral modification strategies: increasing lean protein intake, meal planning and cooking strategies, keeping healthy foods in the home, travel eating strategies, and planning for success. ? ?Denise Austin has agreed to follow-up with our clinic in 3 weeks. She was informed of the importance of frequent follow-up visits to maximize her success with intensive lifestyle modifications for her multiple health conditions.  ? ?Objective:  ? ?Blood pressure 125/89, pulse 74, temperature 97.6 ?F (36.4 ?C), height '5\' 5"'$  (1.651 m), weight 250 lb (113.4 kg), last menstrual period 04/02/2018, SpO2 97 %. ?Body mass index is 41.6 kg/m?Marland Kitchen ? ?  General: Cooperative, alert, well developed, in no acute distress. ?HEENT: Conjunctivae and lids unremarkable. ?Cardiovascular: Regular rhythm.  ?Lungs: Normal work of breathing. ?Neurologic: No focal deficits.  ? ?Lab Results  ?Component Value Date  ? CREATININE 0.89 12/24/2021  ? BUN 21 12/24/2021  ? NA 140 12/24/2021  ? K 4.3 12/24/2021  ? CL 104 12/24/2021  ? CO2 24 12/24/2021  ? ?Lab  Results  ?Component Value Date  ? ALT 13 12/24/2021  ? AST 14 12/24/2021  ? ALKPHOS 90 12/24/2021  ? BILITOT 0.5 12/24/2021  ? ?Lab Results  ?Component Value Date  ? HGBA1C 6.2 (H) 12/24/2021  ? HGBA1C 6.3 (H) 09/13/2021  ? HGBA1C 6.1 (H) 01/25/2021  ? HGBA1C 5.9 01/22/2018  ? HGBA1C 5.7 01/16/2017  ? ?Lab Results  ?Component Value Date  ? INSULIN 11.6 12/24/2021  ? ?Lab Results  ?Component Value Date  ? TSH 1.050 12/24/2021  ? ?Lab Results  ?Component Value Date  ? CHOL 136 12/24/2021  ? HDL 50 12/24/2021  ? [place] 72 12/24/2021  ? TRIG 72 12/24/2021  ? CHOLHDL 2.7 09/13/2021  ? ?Lab Results  ?Component Value Date  ? VD25OH 29.4 (L) 12/24/2021  ? ?Lab Results  ?Component Value Date  ? WBC 6.8 12/24/2021  ? HGB 13.7 12/24/2021  ? HCT 41.4 12/24/2021  ? MCV 92 12/24/2021  ? PLT 291 12/24/2021  ? ?No results found for: IRON, TIBC, FERRITIN ? ?Attestation Statements:  ? ?Reviewed by clinician on day of visit: allergies, medications, problem list, medical history, surgical history, family history, social history, and previous encounter notes. ? ?I, Lizbeth Bark, RMA, am acting as transcriptionist for Coralie Common, MD.  ? ?I have reviewed the above documentation for accuracy and completeness, and I agree with the above. Coralie Common, MD ? ?

## 2022-02-06 ENCOUNTER — Other Ambulatory Visit (HOSPITAL_BASED_OUTPATIENT_CLINIC_OR_DEPARTMENT_OTHER): Payer: Self-pay

## 2022-02-06 ENCOUNTER — Other Ambulatory Visit (HOSPITAL_COMMUNITY): Payer: Self-pay

## 2022-02-12 ENCOUNTER — Encounter: Payer: Self-pay | Admitting: Physical Therapy

## 2022-02-12 ENCOUNTER — Ambulatory Visit (INDEPENDENT_AMBULATORY_CARE_PROVIDER_SITE_OTHER): Payer: No Typology Code available for payment source | Admitting: Physical Therapy

## 2022-02-12 DIAGNOSIS — M546 Pain in thoracic spine: Secondary | ICD-10-CM

## 2022-02-12 DIAGNOSIS — M5441 Lumbago with sciatica, right side: Secondary | ICD-10-CM | POA: Diagnosis not present

## 2022-02-12 DIAGNOSIS — M6281 Muscle weakness (generalized): Secondary | ICD-10-CM

## 2022-02-12 NOTE — Therapy (Signed)
? ?OUTPATIENT PHYSICAL THERAPY TREATMENT NOTE/Discharge ?PHYSICAL THERAPY DISCHARGE SUMMARY ? ?Visits from Start of Care: 5 ? ?Current functional level related to goals / functional outcomes: ?See below ?  ?Remaining deficits: ?See below ?  ?Education / Equipment: ?See below  ?Plan: ?Patient agrees to discharge.  Patient goals were not met. Patient is being discharged due to meeting the stated rehab goals.    ? ? ? ? ? ?Patient Name: Denise Austin ?MRN: [id number] ?DOB:10/04/1971, 51 y.o., female ?Today's Date: 02/12/2022 ? ?PCP: Debbrah Alar, NP ?REFERRING PROVIDER: Wallace Going, DO ? ? PT End of Session - 02/12/22 1024   ? ? Visit Number 5   ? Number of Visits 6   ? PT Start Time 1020   ? PT Stop Time 1100   ? PT Time Calculation (min) 40 min   ? Activity Tolerance Patient tolerated treatment well   ? ?  ?  ? ?  ? ? ? ?Past Medical History:  ?Diagnosis Date  ? Allergy   ? GERD (gastroesophageal reflux disease)   ? Hyperglycemia   ? Hypertension   ? OSA (obstructive sleep apnea) 04/15/2018  ? PONV (postoperative nausea and vomiting)   ? Sleep apnea   ? wears cpap   ? Uterine fibroid   ? ?Past Surgical History:  ?Procedure Laterality Date  ? BUNIONECTOMY    ? Lt-01/07/06, Rt-09/15/07  ? CHOLECYSTECTOMY    ? CYST REMOVAL HAND    ? Left Wrist  ? LUMBAR LAMINECTOMY/DECOMPRESSION MICRODISCECTOMY Right 04/22/2017  ? Procedure: Extraforaminal Microdiscectomy - Lumbar two-Lumbar three - right;  Surgeon: Eustace Moore, MD;  Location: [facility] [facility];  Service: Neurosurgery;  Laterality: Right;  ? lumbar surgery revision  04/24/2017  ? WOUND EXPLORATION N/A 05/22/2017  ? Procedure: Revision Of Lumbar Wound;  Surgeon: Eustace Moore, MD;  Location: [facility];  Service: Neurosurgery;  Laterality: N/A;  Lumbar wound revision  ? ?Patient Active Problem List  ? Diagnosis Date Noted  ? Symptomatic mammary hypertrophy 12/02/2021  ? Back pain 12/02/2021  ? Neck pain 12/02/2021  ? Morbid obesity due to excess calories ([place])  04/21/2019  ? OSA (obstructive sleep apnea) 04/15/2018  ? Herpes simplex viral infection 07/10/2017  ? S/P lumbar laminectomy 04/22/2017  ? GERD (gastroesophageal reflux disease) 07/25/2016  ? Hyperglycemia 07/25/2016  ? HTN (hypertension) 12/24/2015  ? Preventative health care 10/16/2014  ? [place] arthritis 07/06/2014  ? S/P excision of ganglion cyst 07/06/2014  ? Synovitis 07/06/2014  ? ? ?PCP: Debbrah Alar, NP ?  ?REFERRING PROVIDER: Wallace Going, DO ?  ?REFERRING DIAG:  ?Z98.890 (ICD-10-CM) - S/P lumbar laminectomy  ?N62 (ICD-10-CM) - Symptomatic mammary hypertrophy  ?M54.6,G89.29 (ICD-10-CM) - Chronic bilateral thoracic back pain  ?M54.2 (ICD-10-CM) - Neck pain  ?  ?  ?THERAPY DIAG:  ?Pain in thoracic spine M54.6 ?Muscle weakness M62.8 ?  ?ONSET DATE: 6+ months ago ?  ?SUBJECTIVE:                                                                                                                                                                                          ?  ?  SUBJECTIVE STATEMENT: ?Pt states that the pain has been doing good, she has been doing HEP. She did have fall 2 weeks ago and has bruse on her left hip but her back is doing well.  ? ?PERTINENT HISTORY:  S/P lumbar laminectomy, HTN (hypertension),  Hyperglycemia, obesity  ?  ?PAIN:  ?No pain upon arrival at worst, can have some pain with lifting or work at times ?   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  ?PLOF: Independent ?  ?PATIENT GOALS: Decrease pain, attempt physical therapy before having breast reduction surgery ?  ?  ?OBJECTIVE:  ?PATIENT SURVEYS:  ?FOTO 85%        ?  ?UPPER EXTREMITY AROM/PROM: ?          All Shoulder ROM full, there was pain with L shoulder abduction and flexion at end range. Neck ROM full and painfree ?(Blank rows = not tested) ?  ?UPPER EXTREMITY MMT: ?  ?MMT Right ?12/11/2021 Left ?12/11/2021 Right ?01/01/22 Left ?01/01/22 Bilat ?02/12/22  ?Shoulder flexion 4+/5 4+/5 4+ 4+ 5  ?Shoulder extension  3+/5 3+/5     ?Shoulder abduction 4/5 4/5 4+ 4+ 5  ?Shoulder internal rotation Measure at next visit Measure at next visit $RemoveBe'5 5 5  'PxncYMeLJ$ ?Shoulder external rotation Measure at next visit Measure at next visit $RemoveBe'5 5 5  'rxuYCVcnn$ ?Middle trapezius 5/5 5/5   5  ?Lower trapezius 4/5 4/5   4+  ?Rhomboids 4+/5 4+/5   5  ?(Blank rows = not tested) ?  ?JOINT MOBILITY TESTING:  ?CPA's at T3-T6 spinal level show hypomobility with concordant pain  ?  ?PALPATION: ?Tenderness to palpation at bilat supraspinatus area, upper trapezius and rhomboids. ?           ?TODAY'S TREATMENT:  ?02/12/2022: ? Therapeutic Exercise: ? Aerobic:  UBE L5 2.5 min fwd, 2.5 min retro ?Supine: ?Sidelying: T spine rotations 5 sec X10 ? Seated:thoracic extensions 10 sec X 5 ? Standing: Green theraband X 20 shoulder rows and bilat shoulder extensions. Green band for bilat horizontal abd and ER  X15 ea. L stretch at counter top 5 sec X10 ? Quadruped: Cat and Cow stretch x 10 holding 5 sec ?Modalities: Modalities:  E- stim and heat bilat  thoracic paraspinals for pain modulation. (IFC x 15 minutes current to pt. tolerance) ? ?01/22/2022: ? Therapeutic Exercise: ? Aerobic:   ?Supine: ?Sidelying:  ? Seated:pball roll outs into lumbar flexion/throacic extensionfwd and diagonals 10 sec X 5 ? Standing: Green theraband X 15 alternating unilat shoulder rows and bilat shoulder extensions. Green band for bilat horizontal abd and ER  X15 ea. UE red ball roll up wall ? Quadruped: Cat and Cow stretch x 10 holding 5 sec, thread the needle stretch (horizontal add moving into H abd) 5 sec X 10 bilat. ? ?Modalities: Modalities:  E- stim and heat bilat  thoracic paraspinals for pain modulation. (IFC x 15 minutes current to pt. tolerance) ? ?01/01/2022: ? Therapeutic Exercise: ? Aerobic: UBE lvl 5, 3 min forward/ 3 min backward  ?Supine: ?Sidelying:  ? Seated:pball roll outs into lumbar flexion/throacic extensionfwd and diagonals 10 sec X 5 ? Standing: Green theraband X 15 alternating unilat  shoulder rows and shoulder ext. Green band for bilat horizontal abd and ER  X15 ea ? Quadruped: Cat and Cow stretch x 10 holding 5 sec ?Neuromuscular Re-education: ?Manual Therapy: ?Therapeutic Activity: ?Self Care: ?Trigger Point Dry Needling:  ?Modalities:  E- stim and heat bilat  thoracic paraspinals for pain modulation. (  IFC x 10 minutes current to pt. tolerance) ?  ?PATIENT EDUCATION: ?Education details: HEP  ?Person educated: Patient ?Education method: Explanation, Demonstration, and Handouts ?Education comprehension: verbalized understanding and needs further education ?  ?  ?HOME EXERCISE PROGRAM: ?Access Code: [email or web address] ?URL: [email or web address] ?Date: 02/12/2022 ?Prepared by: Elsie Ra ? ?Exercises ?- Seated Thoracic Lumbar Extension  - 2 x daily - 6 x weekly - 1 sets - 10 reps - 5 hold ?- Standing 'L' Stretch at Counter  - 2 x daily - 6 x weekly - 1 sets - 10 reps - 5 hold ?- Sidelying Thoracic Rotation with Open Book  - 1 x daily - 3 x weekly - 1 sets - 10 reps - 10 sec hold ?- Cat Cow  - 3 x daily - 1 sets - 10 reps - 5 hold ?- Standing Shoulder Horizontal Abduction with Resistance  - 1 x daily - 3 x weekly - 2 sets - 10 reps ?- Shoulder External Rotation and Scapular Retraction with Resistance  - 1 x daily - 3 x weekly - 3 sets - 10 reps ?- Standing Shoulder Row with Anchored Resistance  - 1 x daily - 3 x weekly - 3 sets - 10 reps ?  ?  ?ASSESSMENT: ?  ?ASSESSMENT: ?  ?CLINICAL IMPRESSION: ?She has done well over last 2 weeks managing her back pain with HEP. She feels ready to discharge today and PT agrees. I updated her final HEP and we reviewed this and she shows good understanding. ?  ?REHAB POTENTIAL: Good ?  ?CLINICAL DECISION MAKING: stable/uncomplicated ?  ?EVALUATION COMPLEXITY: Low ?  ?  ?GOALS: ?Short term PT Goals (target date for Short term goals are 4 weeks 01/07/22) ?Pt will be I and compliant with HEP. ?Baseline:  ?Goal status: MET ?Pt will decrease pain by 25%  overall ?Baseline: ?Goal status: MET ?  ?Long term PT goals (target dates for all long term goals are 8 weeks 01/21/22) ?Pt will improve shoulder strength to at least 4+/5 MMT to improve functional strength ?Baseline:

## 2022-02-14 ENCOUNTER — Encounter: Payer: Self-pay | Admitting: Surgery

## 2022-02-14 ENCOUNTER — Ambulatory Visit (INDEPENDENT_AMBULATORY_CARE_PROVIDER_SITE_OTHER): Payer: No Typology Code available for payment source | Admitting: Surgery

## 2022-02-14 VITALS — BP 151/93 | HR 66 | Ht 65.0 in | Wt 250.0 lb

## 2022-02-14 DIAGNOSIS — W010XXA Fall on same level from slipping, tripping and stumbling without subsequent striking against object, initial encounter: Secondary | ICD-10-CM

## 2022-02-14 DIAGNOSIS — S7002XA Contusion of left hip, initial encounter: Secondary | ICD-10-CM

## 2022-02-18 ENCOUNTER — Other Ambulatory Visit (HOSPITAL_COMMUNITY): Payer: Self-pay

## 2022-02-18 ENCOUNTER — Ambulatory Visit (INDEPENDENT_AMBULATORY_CARE_PROVIDER_SITE_OTHER): Payer: No Typology Code available for payment source | Admitting: Family Medicine

## 2022-02-18 ENCOUNTER — Encounter (INDEPENDENT_AMBULATORY_CARE_PROVIDER_SITE_OTHER): Payer: Self-pay | Admitting: Family Medicine

## 2022-02-18 VITALS — BP 135/84 | HR 71 | Temp 98.1°F | Ht 65.0 in | Wt 246.0 lb

## 2022-02-18 DIAGNOSIS — Z9189 Other specified personal risk factors, not elsewhere classified: Secondary | ICD-10-CM

## 2022-02-18 DIAGNOSIS — E669 Obesity, unspecified: Secondary | ICD-10-CM

## 2022-02-18 DIAGNOSIS — Z6841 Body Mass Index (BMI) 40.0 and over, adult: Secondary | ICD-10-CM

## 2022-02-18 DIAGNOSIS — E559 Vitamin D deficiency, unspecified: Secondary | ICD-10-CM

## 2022-02-18 DIAGNOSIS — I10 Essential (primary) hypertension: Secondary | ICD-10-CM | POA: Diagnosis not present

## 2022-02-18 DIAGNOSIS — E66813 Obesity, class 3: Secondary | ICD-10-CM

## 2022-02-18 MED ORDER — VITAMIN D (ERGOCALCIFEROL) 1.25 MG (50000 UNIT) PO CAPS
50000.0000 [IU] | ORAL_CAPSULE | ORAL | 0 refills | Status: DC
  Filled 2022-02-18 – 2022-03-07 (×2): qty 4, 28d supply, fill #0

## 2022-02-18 MED ORDER — WEGOVY 0.25 MG/0.5ML ~~LOC~~ SOAJ
0.2500 mg | SUBCUTANEOUS | 0 refills | Status: DC
  Filled 2022-02-18 – 2022-03-07 (×2): qty 2, 28d supply, fill #0

## 2022-02-20 ENCOUNTER — Ambulatory Visit (INDEPENDENT_AMBULATORY_CARE_PROVIDER_SITE_OTHER): Payer: No Typology Code available for payment source | Admitting: Surgery

## 2022-02-20 ENCOUNTER — Encounter: Payer: Self-pay | Admitting: Surgery

## 2022-02-20 VITALS — Ht 65.0 in | Wt 246.0 lb

## 2022-02-20 DIAGNOSIS — S7002XA Contusion of left hip, initial encounter: Secondary | ICD-10-CM | POA: Diagnosis not present

## 2022-02-20 NOTE — Progress Notes (Signed)
Office Visit Note   Patient: Denise Austin           Date of Birth: 23-Jun-1971           MRN: [id number] Visit Date: 02/20/2022              Requested by: Debbrah Alar, NP [address] [place],  [place] [id number] PCP: Debbrah Alar, NP   Assessment & Plan: Visit Diagnoses:  1. Contusion of left hip, initial encounter     Plan: continues to improve.  Will f/u in office PRN.  Let me know if pain returns and swelling worsens.  All questions answered.   Follow-Up Instructions: Return if symptoms worsen or fail to improve.   Orders:  No orders of the defined types were placed in this encounter.  No orders of the defined types were placed in this encounter.     Procedures: No procedures performed   Clinical Data: No additional findings.   Subjective: Chief Complaint  Patient presents with   Left Hip - Follow-up    HPI Patient returns for recheck of her left hip.  States that he continues to do better.  Not having any issues.   Objective: Vital Signs: Ht '5\' 5"'$  (1.651 m)   Wt 246 lb (111.6 kg)   LMP 04/02/2018   BMI 40.94 kg/m   Physical Exam HENT:     Head: Normocephalic.  Pulmonary:     Effort: No respiratory distress.  Musculoskeletal:     Comments: Left lateral hip mild swelling.  Continues to improve.  Gait normal.  Hip nontender.    Neurological:     Mental Status: She is alert and oriented to person, place, and time.    Ortho Exam  Specialty Comments:  No specialty comments available.  Imaging: No results found.   PMFS History: Patient Active Problem List   Diagnosis Date Noted   Symptomatic mammary hypertrophy 12/02/2021   Back pain 12/02/2021   Neck pain 12/02/2021   Morbid obesity due to excess calories ([place]) 04/21/2019   OSA (obstructive sleep apnea) 04/15/2018   Herpes simplex viral infection 07/10/2017   S/P lumbar laminectomy 04/22/2017   GERD (gastroesophageal reflux disease) 07/25/2016    Hyperglycemia 07/25/2016   HTN (hypertension) 12/24/2015   Preventative health care 10/16/2014   [place] arthritis 07/06/2014   S/P excision of ganglion cyst 07/06/2014   Synovitis 07/06/2014   Past Medical History:  Diagnosis Date   Allergy    GERD (gastroesophageal reflux disease)    Hyperglycemia    Hypertension    OSA (obstructive sleep apnea) 04/15/2018   PONV (postoperative nausea and vomiting)    Sleep apnea    wears cpap    Uterine fibroid     Family History  Problem Relation Age of Onset   Hypertension Mother    Diabetes Mother    Sleep apnea Mother    Obesity Mother    Stroke Father    Hypertension Father        smoker   COPD Father    Single kidney Sister        removed as a child   Heart attack Neg Hx    Hyperlipidemia Neg Hx    Sudden death Neg Hx    Colon cancer Neg Hx    Colon polyps Neg Hx    Esophageal cancer Neg Hx    Stomach cancer Neg Hx    Rectal cancer Neg Hx  Past Surgical History:  Procedure Laterality Date   BUNIONECTOMY     Lt-01/07/06, Rt-09/15/07   CHOLECYSTECTOMY     CYST REMOVAL HAND     Left Wrist   LUMBAR LAMINECTOMY/DECOMPRESSION MICRODISCECTOMY Right 04/22/2017   Procedure: Extraforaminal Microdiscectomy - Lumbar two-Lumbar three - right;  Surgeon: Eustace Moore, MD;  Location: [facility];  Service: Neurosurgery;  Laterality: Right;   lumbar surgery revision  04/24/2017   WOUND EXPLORATION N/A 05/22/2017   Procedure: Revision Of Lumbar Wound;  Surgeon: Eustace Moore, MD;  Location: [facility];  Service: Neurosurgery;  Laterality: N/A;  Lumbar wound revision   Social History   Occupational History   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

## 2022-02-26 ENCOUNTER — Other Ambulatory Visit (HOSPITAL_COMMUNITY): Payer: Self-pay

## 2022-02-26 NOTE — Progress Notes (Signed)
Chief Complaint:   OBESITY Denise Austin is here to discuss her progress with her obesity treatment plan along with follow-up of her obesity related diagnoses. Denise Austin is on the Category 3 Plan and states she is following her eating plan approximately 90% of the time. Denise Austin states she is doing arm exercises 5-10 minutes 3 times per week.  Today's visit was # 4 Starting weight: 251 lbs Starting date: 12/24/2021 Today's weight: 246 lbs Today's date: 02/18/22 Total lbs lost to date: 5 Total lbs lost since last in-office visit: 4  Interim History: Denise Austin fell at [employer] recently and her left leg has been recovering. She was at the beach for a week and she did not follow plan as strictly. When returning from her trip she got back on plan. She has noticed a decrease in food intake recently. No plans for trips or events. She may be interested in switching meal plans.  Subjective:   1. Vitamin D deficiency She is currently taking prescription vitamin D 50,000 IU each week. She denies nausea, vomiting or muscle weakness.  2. Essential hypertension Denise Austin blood pressure was well controlled today. Denies chest pain, chest pressure and headache.  3. At risk for deficient intake of food The patient is at a higher than average risk of deficient intake of food due to skipping meals.   Assessment/Plan:   1. Vitamin D deficiency Denise Austin will continue prescription for Vit D. We will refill Vit D for 1 month with no refills. - Refill Vitamin D, Ergocalciferol, (DRISDOL) 1.25 MG (50000 UNIT) CAPS capsule; Take 1 capsule (50,000 Units total) by mouth every 7 (seven) days.  Dispense: 4 capsule; Refill: 0  2. Essential hypertension Denise Austin will continue with current medications at current dose.  3. At risk for deficient intake of food Denise Austin was given approximately 15 minutes of deficient intake of food prevention counseling today. Denise Austin is at risk for eating too few  calories based on current food recall. She was encouraged to focus on meeting caloric and protein goals according to her recommended meal plan.   4. Obesity with current BMI of 41.0 We will refill Wegovy 0.25 mg sub Q once weekly for 1 month with no refills. - Refill Semaglutide-Weight Management (WEGOVY) 0.25 MG/0.5ML SOAJ; Inject 0.25 mg into the skin once a week.  Dispense: 2 mL; Refill: 0  Denise Austin is currently in the action stage of change. As such, her goal is to continue with weight loss efforts. She has agreed to the [employer].   Exercise goals: All adults should avoid inactivity. Some physical activity is better than none, and adults who participate in any amount of physical activity gain some health benefits.  Behavioral modification strategies: increasing lean protein intake, no skipping meals, meal planning and cooking strategies, keeping healthy foods in the home, and planning for success.  Denise Austin has agreed to follow-up with our clinic in 3 weeks. She was informed of the importance of frequent follow-up visits to maximize her success with intensive lifestyle modifications for her multiple health conditions.   Objective:   Blood pressure 135/84, pulse 71, temperature 98.1 F (36.7 C), height '5\' 5"'$  (1.651 m), weight 246 lb (111.6 kg), last menstrual period 04/02/2018, SpO2 99 %. Body mass index is 40.94 kg/m.  General: Cooperative, alert, well developed, in no acute distress. HEENT: Conjunctivae and lids unremarkable. Cardiovascular: Regular rhythm.  Lungs: Normal work of breathing. Neurologic: No focal deficits.   Lab Results  Component Value Date   CREATININE  0.89 12/24/2021   BUN 21 12/24/2021   NA 140 12/24/2021   K 4.3 12/24/2021   CL 104 12/24/2021   CO2 24 12/24/2021   Lab Results  Component Value Date   ALT 13 12/24/2021   AST 14 12/24/2021   ALKPHOS 90 12/24/2021   BILITOT 0.5 12/24/2021   Lab Results  Component Value Date   HGBA1C 6.2 (H)  12/24/2021   HGBA1C 6.3 (H) 09/13/2021   HGBA1C 6.1 (H) 01/25/2021   HGBA1C 5.9 01/22/2018   HGBA1C 5.7 01/16/2017   Lab Results  Component Value Date   INSULIN 11.6 12/24/2021   Lab Results  Component Value Date   TSH 1.050 12/24/2021   Lab Results  Component Value Date   CHOL 136 12/24/2021   HDL 50 12/24/2021   LDLCALC 72 12/24/2021   TRIG 72 12/24/2021   CHOLHDL 2.7 09/13/2021   Lab Results  Component Value Date   VD25OH 29.4 (L) 12/24/2021   Lab Results  Component Value Date   WBC 6.8 12/24/2021   HGB 13.7 12/24/2021   HCT 41.4 12/24/2021   MCV 92 12/24/2021   PLT 291 12/24/2021   No results found for: IRON, TIBC, FERRITIN  Attestation Statements:   Reviewed by clinician on day of visit: allergies, medications, problem list, medical history, surgical history, family history, social history, and previous encounter notes.  I, Elnora Morrison, RMA am acting as transcriptionist for Coralie Common, MD.  I have reviewed the above documentation for accuracy and completeness, and I agree with the above. - Coralie Common, MD

## 2022-03-07 ENCOUNTER — Other Ambulatory Visit (HOSPITAL_COMMUNITY): Payer: Self-pay

## 2022-03-10 ENCOUNTER — Other Ambulatory Visit (HOSPITAL_COMMUNITY): Payer: Self-pay

## 2022-03-10 ENCOUNTER — Other Ambulatory Visit: Payer: Self-pay | Admitting: Family

## 2022-03-10 MED ORDER — METOPROLOL SUCCINATE ER 25 MG PO TB24
75.0000 mg | ORAL_TABLET | Freq: Every day | ORAL | 0 refills | Status: DC
  Filled 2022-03-10: qty 270, 90d supply, fill #0

## 2022-03-13 ENCOUNTER — Ambulatory Visit (INDEPENDENT_AMBULATORY_CARE_PROVIDER_SITE_OTHER): Payer: No Typology Code available for payment source | Admitting: Family Medicine

## 2022-03-13 ENCOUNTER — Other Ambulatory Visit (HOSPITAL_COMMUNITY): Payer: Self-pay

## 2022-03-13 ENCOUNTER — Encounter (INDEPENDENT_AMBULATORY_CARE_PROVIDER_SITE_OTHER): Payer: Self-pay | Admitting: Family Medicine

## 2022-03-13 VITALS — BP 105/70 | HR 82 | Temp 98.4°F | Ht 65.0 in | Wt 241.0 lb

## 2022-03-13 DIAGNOSIS — E559 Vitamin D deficiency, unspecified: Secondary | ICD-10-CM | POA: Diagnosis not present

## 2022-03-13 DIAGNOSIS — E669 Obesity, unspecified: Secondary | ICD-10-CM

## 2022-03-13 DIAGNOSIS — Z6841 Body Mass Index (BMI) 40.0 and over, adult: Secondary | ICD-10-CM | POA: Diagnosis not present

## 2022-03-13 DIAGNOSIS — I10 Essential (primary) hypertension: Secondary | ICD-10-CM

## 2022-03-13 MED ORDER — VITAMIN D (ERGOCALCIFEROL) 1.25 MG (50000 UNIT) PO CAPS
50000.0000 [IU] | ORAL_CAPSULE | ORAL | 0 refills | Status: DC
  Filled 2022-03-13 – 2022-04-03 (×2): qty 4, 28d supply, fill #0

## 2022-03-13 MED ORDER — WEGOVY 0.5 MG/0.5ML ~~LOC~~ SOAJ
0.5000 mg | SUBCUTANEOUS | 0 refills | Status: DC
Start: 2022-03-13 — End: 2022-03-17
  Filled 2022-03-13: qty 2, 28d supply, fill #0

## 2022-03-14 ENCOUNTER — Other Ambulatory Visit (HOSPITAL_COMMUNITY): Payer: Self-pay

## 2022-03-14 ENCOUNTER — Ambulatory Visit: Payer: No Typology Code available for payment source | Admitting: Family

## 2022-03-14 ENCOUNTER — Ambulatory Visit: Payer: 59 | Admitting: Family

## 2022-03-17 ENCOUNTER — Telehealth (INDEPENDENT_AMBULATORY_CARE_PROVIDER_SITE_OTHER): Payer: Self-pay | Admitting: Family Medicine

## 2022-03-17 ENCOUNTER — Other Ambulatory Visit (HOSPITAL_COMMUNITY): Payer: Self-pay

## 2022-03-17 ENCOUNTER — Other Ambulatory Visit (INDEPENDENT_AMBULATORY_CARE_PROVIDER_SITE_OTHER): Payer: Self-pay

## 2022-03-17 MED ORDER — WEGOVY 0.5 MG/0.5ML ~~LOC~~ SOAJ: 0.5000 mg | SUBCUTANEOUS | 0 refills | Status: DC

## 2022-03-17 NOTE — Telephone Encounter (Signed)
[facility] called to report WEGOVY 1 MG is on Back-Order.  They tried to call pt for over a week but her vm box is full. Pharmacy states they only have in stock 0.25 mg and 2.4 mg.  Also, [facility] is on back-order.  Pt says she is taking 0.5 mg at the moment. Please advise patient of resolve.

## 2022-03-17 NOTE — Telephone Encounter (Signed)
Spoke w/ pt , sent medicine to a different pharmacy

## 2022-03-20 NOTE — Progress Notes (Signed)
Chief Complaint:   OBESITY Denise Austin is here to discuss her progress with her obesity treatment plan along with follow-up of her obesity related diagnoses. [name] is on the Category 3 Plan and the [place] and states she is following her eating plan approximately 75% of the time. [name] states she is exercising 0 minutes 0 times per week.  Today's visit was #: 5 Starting weight: 251 lbs Starting date: 12/24/2021 Today's weight: 241 lbs Today's date: 03/13/2022 Total lbs lost to date: 10 Total lbs lost since last in-office visit: 5  Interim History: Denise Austin has been trying to make it work. She did feel more hunger over the last few weeks. The days she ate Cat 3 dinner she felt more full. For the next few weeks no big upcoming plans. She has no plans for [facility].  Subjective:   1. Vitamin D deficiency Denise Austin is currently taking prescription vitamin D 50,000 IU each week. She denies nausea, vomiting or muscle weakness. She notes fatigue.  2. Essential hypertension Denise Austin blood pressure was well controlled today. Denies chest pain, chest pressure and headache.  Assessment/Plan:   1. Vitamin D deficiency Denise Austin will continue prescription for Vit D. We will refill Vit D for 1 month with no refills.  -Refill Vitamin D, Ergocalciferol, (DRISDOL) 1.25 MG (50000 UNIT) CAPS capsule; Take 1 capsule (50,000 Units total) by mouth every 7 (seven) days.  Dispense: 4 capsule; Refill: 0  2. Essential hypertension Denise Austin will continue with current medications at current dose. If blood pressure stays well controlled consider decrease in medication.  3. Obesity with current BMI of 40.2 Denise Austin is currently in the action stage of change. As such, her goal is to continue with weight loss efforts. She has agreed to the Pescatarian Plan+300.  Exercise goals: No exercise has been prescribed at this time.  Behavioral modification strategies: increasing lean protein  intake, meal planning and cooking strategies, keeping healthy foods in the home, and planning for success.  Denise Austin has agreed to follow-up with our clinic in 4 weeks. She was informed of the importance of frequent follow-up visits to maximize her success with intensive lifestyle modifications for her multiple health conditions.   Objective:   Blood pressure 105/70, pulse 82, temperature 98.4 F (36.9 C), height '5\' 5"'$  (1.651 m), weight 241 lb (109.3 kg), last menstrual period 04/02/2018, SpO2 100 %. Body mass index is 40.1 kg/m.  General: Cooperative, alert, well developed, in no acute distress. HEENT: Conjunctivae and lids unremarkable. Cardiovascular: Regular rhythm.  Lungs: Normal work of breathing. Neurologic: No focal deficits.   Lab Results  Component Value Date   CREATININE 0.89 12/24/2021   BUN 21 12/24/2021   NA 140 12/24/2021   K 4.3 12/24/2021   CL 104 12/24/2021   CO2 24 12/24/2021   Lab Results  Component Value Date   ALT 13 12/24/2021   AST 14 12/24/2021   ALKPHOS 90 12/24/2021   BILITOT 0.5 12/24/2021   Lab Results  Component Value Date   HGBA1C 6.2 (H) 12/24/2021   HGBA1C 6.3 (H) 09/13/2021   HGBA1C 6.1 (H) 01/25/2021   HGBA1C 5.9 01/22/2018   HGBA1C 5.7 01/16/2017   Lab Results  Component Value Date   INSULIN 11.6 12/24/2021   Lab Results  Component Value Date   TSH 1.050 12/24/2021   Lab Results  Component Value Date   CHOL 136 12/24/2021   HDL 50 12/24/2021   LDLCALC 72 12/24/2021   TRIG 72 12/24/2021   CHOLHDL  2.7 09/13/2021   Lab Results  Component Value Date   VD25OH 29.4 (L) 12/24/2021   Lab Results  Component Value Date   WBC 6.8 12/24/2021   HGB 13.7 12/24/2021   HCT 41.4 12/24/2021   MCV 92 12/24/2021   PLT 291 12/24/2021   No results found for: IRON, TIBC, FERRITIN  Attestation Statements:   Reviewed by clinician on day of visit: allergies, medications, problem list, medical history, surgical history, family  history, social history, and previous encounter notes.  I, Elnora Morrison, RMA am acting as transcriptionist for Coralie Common, MD.  I have reviewed the above documentation for accuracy and completeness, and I agree with the above. - Coralie Common, MD

## 2022-03-21 ENCOUNTER — Ambulatory Visit: Payer: No Typology Code available for payment source | Admitting: Family

## 2022-03-21 ENCOUNTER — Ambulatory Visit (INDEPENDENT_AMBULATORY_CARE_PROVIDER_SITE_OTHER): Payer: No Typology Code available for payment source | Admitting: Family

## 2022-03-21 ENCOUNTER — Other Ambulatory Visit (HOSPITAL_COMMUNITY): Payer: Self-pay

## 2022-03-21 VITALS — BP 130/86 | HR 69 | Temp 98.6°F | Resp 16 | Ht 65.0 in | Wt 246.0 lb

## 2022-03-21 DIAGNOSIS — Z23 Encounter for immunization: Secondary | ICD-10-CM

## 2022-03-21 DIAGNOSIS — K219 Gastro-esophageal reflux disease without esophagitis: Secondary | ICD-10-CM

## 2022-03-21 DIAGNOSIS — I10 Essential (primary) hypertension: Secondary | ICD-10-CM

## 2022-03-21 DIAGNOSIS — R739 Hyperglycemia, unspecified: Secondary | ICD-10-CM

## 2022-03-21 DIAGNOSIS — G4733 Obstructive sleep apnea (adult) (pediatric): Secondary | ICD-10-CM | POA: Diagnosis not present

## 2022-03-21 LAB — BASIC METABOLIC PANEL
BUN: 21 mg/dL (ref 6–23)
CO2: 29 mEq/L (ref 19–32)
Calcium: 9.5 mg/dL (ref 8.4–10.5)
Chloride: 107 mEq/L (ref 96–112)
Creatinine, Ser: 0.87 mg/dL (ref 0.40–1.20)
GFR: 77.04 mL/min (ref >60.00)
Glucose, Bld: 85 mg/dL (ref 70–99)
Potassium: 3.9 mEq/L (ref 3.5–5.1)
Sodium: 143 mEq/L (ref 135–145)

## 2022-03-21 LAB — HEMOGLOBIN A1C: Hgb A1c MFr Bld: 5.7 % (ref 4.6–6.5)

## 2022-03-21 MED ORDER — VALACYCLOVIR HCL 500 MG PO TABS
500.0000 mg | ORAL_TABLET | Freq: Every day | ORAL | 1 refills | Status: DC
  Filled 2022-03-21 – 2022-04-03 (×2): qty 90, 90d supply, fill #0
  Filled 2022-07-09 – 2022-07-17 (×2): qty 90, 90d supply, fill #1

## 2022-03-21 MED ORDER — OMEPRAZOLE 40 MG PO CPDR
40.0000 mg | DELAYED_RELEASE_CAPSULE | Freq: Every day | ORAL | 1 refills | Status: DC | PRN
  Filled 2022-03-21 – 2022-04-03 (×2): qty 90, 90d supply, fill #0
  Filled 2022-07-09 – 2022-07-17 (×2): qty 90, 90d supply, fill #1

## 2022-03-21 MED ORDER — METOPROLOL SUCCINATE ER 25 MG PO TB24
75.0000 mg | ORAL_TABLET | Freq: Every day | ORAL | 0 refills | Status: DC
  Filled 2022-03-21 – 2022-06-11 (×2): qty 270, 90d supply, fill #0

## 2022-03-21 MED ORDER — LOSARTAN POTASSIUM 50 MG PO TABS
50.0000 mg | ORAL_TABLET | Freq: Every day | ORAL | 1 refills | Status: DC
  Filled 2022-03-21 – 2022-05-26 (×2): qty 90, 90d supply, fill #0

## 2022-03-21 NOTE — Assessment & Plan Note (Signed)
Lab Results  Component Value Date   HGBA1C 6.2 (H) 12/24/2021

## 2022-03-21 NOTE — Assessment & Plan Note (Signed)
BP Readings from Last 3 Encounters:  03/21/22 130/86  03/13/22 105/70  02/18/22 135/84   Stable, continue losartan and metoprolol.

## 2022-03-21 NOTE — Progress Notes (Signed)
Subjective:   By signing my name below, I, Carylon Perches, attest that this documentation has been prepared under the direction and in the presence of Debbrah Alar NP, 03/21/2022   Patient ID: Denise Austin, female    DOB: 1971-10-17, 51 y.o.   MRN: [id number]  Chief Complaint  Patient presents with   Hypertension    Here for follow up    HPI Patient is in today for office visit.  Refills - She is requesting a refill of 50 Mg of Losartan, 25 Mg of Metoprolol Succinate, 40 mg of Omeprazole and 500 Mg of Valtrex.  Blood Pressure: She is continuing to take 3 tablets of 25 Mg of Metoprolol Succinate and 1 tablet of 50 Mg Losartan. BP Readings from Last 3 Encounters:  03/21/22 130/86  03/13/22 105/70  02/18/22 135/84   Pulse Readings from Last 3 Encounters:  03/21/22 69  03/13/22 82  02/18/22 71   Cpap - She is continuing to regularly use her Cpap. She reports that she is more energetic during the day. Reflux - She is taking 40 MG of omeprazole. She denies of any reflux.  Blood Sugar - She is currently taking 0.5 Mg of Wegovy. She reports that she has been initially experiencing nausea while on the medication but symptoms have been resolved. Lab Results  Component Value Date   HGBA1C 6.2 (H) 12/24/2021    Wt Readings from Last 3 Encounters:  03/21/22 246 lb (111.6 kg)  03/13/22 241 lb (109.3 kg)  02/20/22 246 lb (111.6 kg)  Immunizations: She is interested in receiving the first dose Shingrix vaccine.  Health Maintenance Due  Topic Date Due   COVID-19 Vaccine (1) Never done    Past Medical History:  Diagnosis Date   Allergy    GERD (gastroesophageal reflux disease)    Hyperglycemia    Hypertension    OSA (obstructive sleep apnea) 04/15/2018   PONV (postoperative nausea and vomiting)    Sleep apnea    wears cpap    Uterine fibroid     Past Surgical History:  Procedure Laterality Date   BUNIONECTOMY     Lt-01/07/06, Rt-09/15/07   CHOLECYSTECTOMY      CYST REMOVAL HAND     Left Wrist   LUMBAR LAMINECTOMY/DECOMPRESSION MICRODISCECTOMY Right 04/22/2017   Procedure: Extraforaminal Microdiscectomy - Lumbar two-Lumbar three - right;  Surgeon: Eustace Moore, MD;  Location: [facility];  Service: Neurosurgery;  Laterality: Right;   lumbar surgery revision  04/24/2017   WOUND EXPLORATION N/A 05/22/2017   Procedure: Revision Of Lumbar Wound;  Surgeon: Eustace Moore, MD;  Location: [facility];  Service: Neurosurgery;  Laterality: N/A;  Lumbar wound revision    Family History  Problem Relation Age of Onset   Hypertension Mother    Diabetes Mother    Sleep apnea Mother    Obesity Mother    Stroke Father    Hypertension Father        smoker   COPD Father    Single kidney Sister        removed as a child   Heart attack Neg Hx    Hyperlipidemia Neg Hx    Sudden death Neg Hx    Colon cancer Neg Hx    Colon polyps Neg Hx    Esophageal cancer Neg Hx    Stomach cancer Neg Hx    Rectal cancer Neg Hx     Social History   Socioeconomic History   Marital status: Single  Spouse name: Not on file   Number of children: Not on file   Years of education: Not on file   Highest education level: Not on file  Occupational History   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  Other Topics Concern   Not on file  Social History Narrative   Has 2 children (2 boys)- one son in [place] and one is local   3 grandchildren 2 boys, 1 granddaughter   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 Gummy vitamin     gabapentin (NEURONTIN) 300 MG capsule Take 1 capsule (300 mg total) by mouth 2 (two) times daily. 60 capsule 2   Semaglutide-Weight Management (WEGOVY) 0.5 MG/0.5ML SOAJ Inject 0.5 mg into the skin once a week. 2 mL 0   Vitamin D, Ergocalciferol, (DRISDOL) 1.25 MG (50000 UNIT) CAPS capsule Take 1 capsule (50,000 Units total) by mouth every 7 (seven) days. 4 capsule 0   losartan (COZAAR) 50 MG tablet Take 1 tablet (50 mg total) by mouth daily. 90 tablet 1   metoprolol succinate (TOPROL-XL) 25 MG 24 hr tablet Take 3 tablets (75 mg total) by mouth daily. 270 tablet 0   omeprazole (PRILOSEC) 40 MG capsule Take 1 capsule (40 mg total) by mouth daily as needed. 90 capsule 1   valACYclovir (VALTREX) 500 MG tablet Take 1 tablet (500 mg total) by mouth daily. 90 tablet 0   No facility-administered medications prior to visit.    No Active Allergies  ROS See HPI    Objective:    Physical Exam Constitutional:      General: She is not in acute distress.    Appearance: Normal appearance. She is not ill-appearing.  HENT:     Head: Normocephalic and atraumatic.     Right Ear: External ear normal.     Left Ear: External ear normal.  Eyes:     Extraocular Movements: Extraocular movements intact.     Pupils: Pupils are equal, round, and reactive to light.  Cardiovascular:     Rate and Rhythm: Normal rate and regular rhythm.     Heart sounds: Normal heart sounds. No murmur heard.   No gallop.  Pulmonary:     Effort: Pulmonary effort is normal. No respiratory distress.     Breath sounds: Normal breath sounds. No wheezing or rales.  Skin:     General: Skin is warm and dry.  Neurological:     Mental Status: She is alert and oriented to person, place, and time.  Psychiatric:        Mood  and Affect: Mood normal.        Behavior: Behavior normal.        Judgment: Judgment normal.    BP 130/86 (BP Location: Right Arm, Patient Position: Sitting, Cuff Size: Large)   Pulse 69   Temp 98.6 F (37 C) (Oral)   Resp 16   Ht '5\' 5"'$  (1.651 m)   Wt 246 lb (111.6 kg)   LMP 04/02/2018   SpO2 100%   BMI 40.94 kg/m  Wt Readings from Last 3 Encounters:  03/21/22 246 lb (111.6 kg)  03/13/22 241 lb (109.3 kg)  02/20/22 246 lb (111.6 kg)       Assessment & Plan:   Problem List Items Addressed This Visit       Unprioritized   OSA (obstructive sleep apnea)    Continues cpap, working well.        Morbid obesity due to excess calories ([place])    Wt Readings from Last 3 Encounters:  03/21/22 246 lb (111.6 kg)  03/13/22 241 lb (109.3 kg)  02/20/22 246 lb (111.6 kg)  She continues wegovy and follow up with the weight loss clinic.       Hyperglycemia - Primary    Lab Results  Component Value Date   HGBA1C 6.2 (H) 12/24/2021        Relevant Orders   Hemoglobin A1c   HTN (hypertension)    BP Readings from Last 3 Encounters:  03/21/22 130/86  03/13/22 105/70  02/18/22 135/84  Stable, continue losartan and metoprolol.       Relevant Medications   losartan (COZAAR) 50 MG tablet   metoprolol succinate (TOPROL-XL) 25 MG 24 hr tablet   Other Relevant Orders   Basic metabolic panel   GERD (gastroesophageal reflux disease)    Stable on omeprazole. Continue same.       Relevant Medications   omeprazole (PRILOSEC) 40 MG capsule   Other Visit Diagnoses     Need for shingles vaccine       Relevant Orders   Varicella-zoster vaccine IM (Shingrix) (Completed)       Meds ordered this encounter  Medications   losartan (COZAAR) 50 MG tablet    Sig: Take 1 tablet (50 mg total) by mouth daily.    Dispense:  90 tablet     Refill:  1    Order Specific Question:   Supervising Provider    Answer:   Penni Homans A [[id number]]   metoprolol succinate (TOPROL-XL) 25 MG 24 hr tablet    Sig: Take 3 tablets (75 mg total) by mouth daily.    Dispense:  270 tablet    Refill:  0    Order Specific Question:   Supervising Provider    Answer:   Penni Homans A [[id number]]   omeprazole (PRILOSEC) 40 MG capsule    Sig: Take 1 capsule (40 mg total) by mouth daily as needed.    Dispense:  90 capsule    Refill:  1    Order Specific Question:   Supervising Provider    Answer:   Penni Homans A [[id number]]   valACYclovir (VALTREX) 500 MG tablet    Sig: Take 1 tablet (500 mg total) by mouth daily.    Dispense:  90 tablet    Refill:  1    Order Specific Question:   Supervising Provider    Answer:   Penni Homans A [[id number]]    I, Nance Pear, NP, personally preformed the services described in this documentation.  All medical record entries made by the scribe were at my direction and in my presence.  I have reviewed the chart and discharge instructions (if applicable) and agree that the record reflects my personal performance and is accurate and complete. 03/21/2022   I,Amber Collins,acting as a scribe for Nance Pear, NP.,have documented all relevant documentation on the behalf of Nance Pear, NP,as directed by  Nance Pear, NP while in the presence of Nance Pear, NP.    Nance Pear, NP

## 2022-03-21 NOTE — Assessment & Plan Note (Signed)
Continues cpap, working well.

## 2022-03-21 NOTE — Assessment & Plan Note (Addendum)
Wt Readings from Last 3 Encounters:  03/21/22 246 lb (111.6 kg)  03/13/22 241 lb (109.3 kg)  02/20/22 246 lb (111.6 kg)   She continues wegovy and follow up with the weight loss clinic.

## 2022-03-21 NOTE — Assessment & Plan Note (Addendum)
Stable on omeprazole. Continue same.  ?

## 2022-03-21 NOTE — Patient Instructions (Signed)
Please complete lab work prior to leaving.   

## 2022-03-23 ENCOUNTER — Encounter (INDEPENDENT_AMBULATORY_CARE_PROVIDER_SITE_OTHER): Payer: Self-pay | Admitting: Family Medicine

## 2022-03-25 NOTE — Telephone Encounter (Signed)
Please advise 

## 2022-03-27 ENCOUNTER — Encounter: Payer: Self-pay | Admitting: Surgery

## 2022-03-27 NOTE — Telephone Encounter (Signed)
Patient called in and would still like to know what she should do.   Call back # : [phone]

## 2022-03-27 NOTE — Progress Notes (Signed)
Office Visit Note   Patient: Denise Austin           Date of Birth: Sep 26, 1971           MRN: [id number] Visit Date: 02/14/2022              Requested by: Debbrah Alar, NP [address] [place],  [place] [address] PCP: Debbrah Alar, NP   Assessment & Plan: Visit Diagnoses:  1. Fall due to wet surface at restaurant January 25, 2022     2. Contusion of left hip, initial encounter     Plan: I think patient is making improvement after her fall on wet floor at a restaurant.  Advised that I think she should gradually continue to improve.  She will follow-up in the office as needed.  If she continues to worsen she will make a return visit and I may consider trying a left hip greater trochanter bursa injection.  She will do some IT band stretching exercises and use ice off-and-on as needed.  Follow-Up Instructions: Return if symptoms worsen or fail to improve.   Orders:  No orders of the defined types were placed in this encounter.  No orders of the defined types were placed in this encounter.     Procedures: No procedures performed   Clinical Data: No additional findings.   Subjective: Chief Complaint  Patient presents with   Left Leg - Follow-up    HPI 51 year old black female returns for recheck after left hip contusion.  As previously documented patient suffered a fall After slipping on a wet floor January 25, 2022 at [place].  She continues to have some soreness over the left lateral hip but overall swelling and pain has decreased.  An ice off-and-on as needed. Review of Systems No current cardiopulmonary GI/GU issues  Objective: Vital Signs: BP (!) 151/93   Pulse 66   Ht '5\' 5"'$  (1.651 m)   Wt 250 lb (113.4 kg)   LMP 04/02/2018   BMI 41.60 kg/m   Physical Exam [place]  female alert and oriented in no acute distress.  Gait is normal.  Negative logroll bilateral hips.  Negative straight leg raise.  Left hip is definitely less  tender.  Swelling also improved.  No focal motor deficits. Ortho Exam  Specialty Comments:  No specialty comments available.  Imaging: No results found.   PMFS History: Patient Active Problem List   Diagnosis Date Noted   Symptomatic mammary hypertrophy 12/02/2021   Back pain 12/02/2021   Neck pain 12/02/2021   Morbid obesity due to excess calories ([place]) 04/21/2019   OSA (obstructive sleep apnea) 04/15/2018   Herpes simplex viral infection 07/10/2017   S/P lumbar laminectomy 04/22/2017   GERD (gastroesophageal reflux disease) 07/25/2016   Hyperglycemia 07/25/2016   HTN (hypertension) 12/24/2015   Preventative health care 10/16/2014   [place] arthritis 07/06/2014   S/P excision of ganglion cyst 07/06/2014   Synovitis 07/06/2014   Past Medical History:  Diagnosis Date   Allergy    GERD (gastroesophageal reflux disease)    Hyperglycemia    Hypertension    OSA (obstructive sleep apnea) 04/15/2018   PONV (postoperative nausea and vomiting)    Sleep apnea    wears cpap    Uterine fibroid     Family History  Problem Relation Age of Onset   Hypertension Mother    Diabetes Mother    Sleep apnea Mother    Obesity Mother    Stroke  Father    Hypertension Father        smoker   COPD Father    Single kidney Sister        removed as a child   Heart attack Neg Hx    Hyperlipidemia Neg Hx    Sudden death Neg Hx    Colon cancer Neg Hx    Colon polyps Neg Hx    Esophageal cancer Neg Hx    Stomach cancer Neg Hx    Rectal cancer Neg Hx     Past Surgical History:  Procedure Laterality Date   BUNIONECTOMY     Lt-01/07/06, Rt-09/15/07   CHOLECYSTECTOMY     CYST REMOVAL HAND     Left Wrist   LUMBAR LAMINECTOMY/DECOMPRESSION MICRODISCECTOMY Right 04/22/2017   Procedure: Extraforaminal Microdiscectomy - Lumbar two-Lumbar three - right;  Surgeon: Eustace Moore, MD;  Location: [facility];  Service: Neurosurgery;  Laterality: Right;   lumbar surgery revision  04/24/2017   WOUND  EXPLORATION N/A 05/22/2017   Procedure: Revision Of Lumbar Wound;  Surgeon: Eustace Moore, MD;  Location: [facility];  Service: Neurosurgery;  Laterality: N/A;  Lumbar wound revision   Social History   Occupational History   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

## 2022-04-02 ENCOUNTER — Other Ambulatory Visit (HOSPITAL_COMMUNITY): Payer: Self-pay

## 2022-04-03 ENCOUNTER — Other Ambulatory Visit (HOSPITAL_COMMUNITY): Payer: Self-pay

## 2022-04-03 ENCOUNTER — Ambulatory Visit (INDEPENDENT_AMBULATORY_CARE_PROVIDER_SITE_OTHER): Payer: No Typology Code available for payment source | Admitting: Family Medicine

## 2022-04-03 ENCOUNTER — Encounter (INDEPENDENT_AMBULATORY_CARE_PROVIDER_SITE_OTHER): Payer: Self-pay | Admitting: Family Medicine

## 2022-04-03 VITALS — BP 120/79 | HR 77 | Temp 98.0°F | Ht 65.0 in | Wt 238.0 lb

## 2022-04-03 DIAGNOSIS — Z6839 Body mass index (BMI) 39.0-39.9, adult: Secondary | ICD-10-CM

## 2022-04-03 DIAGNOSIS — E559 Vitamin D deficiency, unspecified: Secondary | ICD-10-CM | POA: Diagnosis not present

## 2022-04-03 DIAGNOSIS — R7303 Prediabetes: Secondary | ICD-10-CM

## 2022-04-03 DIAGNOSIS — E669 Obesity, unspecified: Secondary | ICD-10-CM | POA: Diagnosis not present

## 2022-04-03 DIAGNOSIS — Z7985 Long-term (current) use of injectable non-insulin antidiabetic drugs: Secondary | ICD-10-CM

## 2022-04-03 MED ORDER — BD PEN NEEDLE NANO 2ND GEN 32G X 4 MM MISC
1.0000 | Freq: Two times a day (BID) | 0 refills | Status: DC
  Filled 2022-04-03 – 2022-05-02 (×4): qty 100, 50d supply, fill #0

## 2022-04-03 MED ORDER — SAXENDA 18 MG/3ML ~~LOC~~ SOPN
0.6000 mg | PEN_INJECTOR | Freq: Every day | SUBCUTANEOUS | 0 refills | Status: DC
  Filled 2022-04-03: qty 3, 30d supply, fill #0

## 2022-04-03 MED ORDER — SAXENDA 18 MG/3ML ~~LOC~~ SOPN
3.0000 mg | PEN_INJECTOR | Freq: Every day | SUBCUTANEOUS | 0 refills | Status: DC
  Filled 2022-04-03 – 2022-05-02 (×5): qty 15, 30d supply, fill #0

## 2022-04-07 ENCOUNTER — Other Ambulatory Visit (HOSPITAL_COMMUNITY): Payer: Self-pay

## 2022-04-07 NOTE — Progress Notes (Unsigned)
Chief Complaint:   OBESITY Denise Austin is here to discuss her progress with her obesity treatment plan along with follow-up of her obesity related diagnoses. Denise Austin is on the [place] and states she is following her eating plan approximately 85% of the time. Denise Austin states she is exercising 0 minutes 0 times per week.  Today's visit was #: 6 Starting weight: 251 lbs Starting date: 12/24/2021 Today's weight: 238 lbs Today's date: 04/03/2022 Total lbs lost to date: 13 lbs Total lbs lost since last in-office visit: 3  Interim History: Jaylon could not get Wegovy due to supply shortage. Foodwise she has been doing Special K protein cereal for breakfast. She alternates food options for lunch. Supper is salmon or chicken (6-8 oz). For snacks she is doing jalapeno chips, small ginger ale at night. Minimal cravings for pizza.  Subjective:   1. Vitamin D deficiency Denise Austin is currently taking prescription Vit D 50,000 IU once a week. Denies any nausea, vomiting or muscle weakness. She notes fatigue. Her last Vit D level of 29.4.  2. Prediabetes Denise Austin previously on GLP-1. Her last A1c AT 5.7  Assessment/Plan:   1. Vitamin D deficiency We will refill Vit D 50,000 IU once a week for 1 month with 0 refills.  2. Prediabetes Change Wegovy to Saxenda.  3. Obesity with current BMI of 39.7 Denise Austin will stop Wegovy; Start Saxenda 3 mg subcutaneously daily.  We will Fill Saxenda 3 mg subcutaneous daily and Pen needles for 1 month with 0 refills.    -Fill Insulin Pen Needle (BD PEN NEEDLE NANO 2ND GEN) 32G X 4 MM MISC; Use 2 times daily  Dispense: 100 each; Refill: 0  -Start Liraglutide -Weight Management (SAXENDA) 18 MG/3ML SOPN; Inject 3 mg into the skin daily.  Dispense: 15 mL; Refill: 0  Denise Austin is currently in the action stage of change. As such, her goal is to continue with weight loss efforts. She has agreed to the Category 3 Plan and the [place]  +300.  Exercise goals: All adults should avoid inactivity. Some physical activity is better than none, and adults who participate in any amount of physical activity gain some health benefits.  Behavioral modification strategies: increasing lean protein intake, meal planning and cooking strategies, and keeping healthy foods in the home.  Denise Austin has agreed to follow-up with our clinic in 3 weeks. She was informed of the importance of frequent follow-up visits to maximize her success with intensive lifestyle modifications for her multiple health conditions.   Objective:   Blood pressure 120/79, pulse 77, temperature 98 F (36.7 C), height '5\' 5"'$  (1.651 m), weight 238 lb (108 kg), last menstrual period 04/02/2018, SpO2 97 %. Body mass index is 39.61 kg/m.  General: Cooperative, alert, well developed, in no acute distress. HEENT: Conjunctivae and lids unremarkable. Cardiovascular: Regular rhythm.  Lungs: Normal work of breathing. Neurologic: No focal deficits.   Lab Results  Component Value Date   CREATININE 0.87 03/21/2022   BUN 21 03/21/2022   NA 143 03/21/2022   K 3.9 03/21/2022   CL 107 03/21/2022   CO2 29 03/21/2022   Lab Results  Component Value Date   ALT 13 12/24/2021   AST 14 12/24/2021   ALKPHOS 90 12/24/2021   BILITOT 0.5 12/24/2021   Lab Results  Component Value Date   HGBA1C 5.7 03/21/2022   HGBA1C 6.2 (H) 12/24/2021   HGBA1C 6.3 (H) 09/13/2021   HGBA1C 6.1 (H) 01/25/2021   HGBA1C 5.9 01/22/2018  Lab Results  Component Value Date   INSULIN 11.6 12/24/2021   Lab Results  Component Value Date   TSH 1.050 12/24/2021   Lab Results  Component Value Date   CHOL 136 12/24/2021   HDL 50 12/24/2021   LDLCALC 72 12/24/2021   TRIG 72 12/24/2021   CHOLHDL 2.7 09/13/2021   Lab Results  Component Value Date   VD25OH 29.4 (L) 12/24/2021   Lab Results  Component Value Date   WBC 6.8 12/24/2021   HGB 13.7 12/24/2021   HCT 41.4 12/24/2021   MCV 92  12/24/2021   PLT 291 12/24/2021   No results found for: "IRON", "TIBC", "FERRITIN"  Attestation Statements:   Reviewed by clinician on day of visit: allergies, medications, problem list, medical history, surgical history, family history, social history, and previous encounter notes.  I, Elnora Morrison, RMA am acting as transcriptionist for Coralie Common, MD.  I have reviewed the above documentation for accuracy and completeness, and I agree with the above. -  ***

## 2022-04-08 ENCOUNTER — Other Ambulatory Visit (HOSPITAL_COMMUNITY): Payer: Self-pay

## 2022-04-09 ENCOUNTER — Other Ambulatory Visit (HOSPITAL_COMMUNITY): Payer: Self-pay

## 2022-04-09 ENCOUNTER — Encounter (INDEPENDENT_AMBULATORY_CARE_PROVIDER_SITE_OTHER): Payer: Self-pay

## 2022-04-09 ENCOUNTER — Telehealth (INDEPENDENT_AMBULATORY_CARE_PROVIDER_SITE_OTHER): Payer: Self-pay | Admitting: Family Medicine

## 2022-04-09 NOTE — Telephone Encounter (Signed)
Dr. Jearld Shines - Prior authorization approved for Saxenda. Effective: 04/08/2022 to 07/29/2022. Patient sent approval message via mychart.

## 2022-04-15 ENCOUNTER — Other Ambulatory Visit (HOSPITAL_COMMUNITY): Payer: Self-pay

## 2022-04-28 ENCOUNTER — Ambulatory Visit (INDEPENDENT_AMBULATORY_CARE_PROVIDER_SITE_OTHER): Payer: No Typology Code available for payment source | Admitting: Family Medicine

## 2022-05-01 ENCOUNTER — Other Ambulatory Visit (HOSPITAL_COMMUNITY): Payer: Self-pay

## 2022-05-01 ENCOUNTER — Other Ambulatory Visit (INDEPENDENT_AMBULATORY_CARE_PROVIDER_SITE_OTHER): Payer: Self-pay | Admitting: Family Medicine

## 2022-05-01 DIAGNOSIS — E559 Vitamin D deficiency, unspecified: Secondary | ICD-10-CM

## 2022-05-02 ENCOUNTER — Other Ambulatory Visit (HOSPITAL_BASED_OUTPATIENT_CLINIC_OR_DEPARTMENT_OTHER): Payer: Self-pay

## 2022-05-02 ENCOUNTER — Other Ambulatory Visit (HOSPITAL_COMMUNITY): Payer: Self-pay

## 2022-05-16 ENCOUNTER — Telehealth: Payer: Self-pay

## 2022-05-16 NOTE — Telephone Encounter (Signed)
Returned patients call. LMVM. Advised her to call our office and schedule an appointment with one of the PA's if she is finished with PT.

## 2022-05-19 ENCOUNTER — Encounter: Payer: Self-pay | Admitting: Family

## 2022-05-19 ENCOUNTER — Telehealth: Payer: Self-pay

## 2022-05-19 ENCOUNTER — Ambulatory Visit (INDEPENDENT_AMBULATORY_CARE_PROVIDER_SITE_OTHER): Payer: No Typology Code available for payment source | Admitting: Family Medicine

## 2022-05-19 ENCOUNTER — Encounter (INDEPENDENT_AMBULATORY_CARE_PROVIDER_SITE_OTHER): Payer: Self-pay | Admitting: Family Medicine

## 2022-05-19 ENCOUNTER — Telehealth: Payer: Self-pay | Admitting: Family

## 2022-05-19 ENCOUNTER — Other Ambulatory Visit (HOSPITAL_COMMUNITY): Payer: Self-pay

## 2022-05-19 VITALS — BP 117/79 | HR 69 | Temp 98.0°F | Ht 65.0 in | Wt 240.0 lb

## 2022-05-19 DIAGNOSIS — R739 Hyperglycemia, unspecified: Secondary | ICD-10-CM

## 2022-05-19 DIAGNOSIS — Z6841 Body Mass Index (BMI) 40.0 and over, adult: Secondary | ICD-10-CM

## 2022-05-19 DIAGNOSIS — E559 Vitamin D deficiency, unspecified: Secondary | ICD-10-CM

## 2022-05-19 DIAGNOSIS — E669 Obesity, unspecified: Secondary | ICD-10-CM | POA: Diagnosis not present

## 2022-05-19 DIAGNOSIS — R7303 Prediabetes: Secondary | ICD-10-CM | POA: Diagnosis not present

## 2022-05-19 MED ORDER — VITAMIN D (ERGOCALCIFEROL) 1.25 MG (50000 UNIT) PO CAPS
50000.0000 [IU] | ORAL_CAPSULE | ORAL | 0 refills | Status: DC
  Filled 2022-05-19: qty 4, 28d supply, fill #0

## 2022-05-19 NOTE — Telephone Encounter (Signed)
Future orders have been placed. tks

## 2022-05-19 NOTE — Telephone Encounter (Signed)
Patient called to advise she goes to the [facility] and has to have certain labs drawn and she is getting a bill for them. She wanted to know if she could bring an order from the clinic to our office and have them drawn here or if Debbrah Alar can place an order for the same labs to be drawn. Patient believes it to be a vitamin D, metabolic panel, etc. Please call patient to advise if this is possible. Best callback number [phone].

## 2022-05-19 NOTE — Telephone Encounter (Signed)
Returned call to patient in reference to labs patient would like to have labs from another doctor done in office and informed patient to sent a mychart message to [facility] or speak with her at next appointment to see if she is willing to put labs under her name. Patient verbalized understanding with no further question.

## 2022-05-19 NOTE — Telephone Encounter (Signed)
Patient scheduled to come in 05/23/22

## 2022-05-20 ENCOUNTER — Ambulatory Visit (INDEPENDENT_AMBULATORY_CARE_PROVIDER_SITE_OTHER): Payer: No Typology Code available for payment source | Admitting: Family Medicine

## 2022-05-20 NOTE — Progress Notes (Unsigned)
Chief Complaint:   OBESITY Denise Austin is here to discuss her progress with her obesity treatment plan along with follow-up of her obesity related diagnoses. Denise Austin is on the Category 3 Plan and the [place] +300 and states she is following her eating plan approximately 75-85% of the time. Denise Austin states she is walking 30 minutes 5 times per week.  Today's visit was #: 7 Starting weight: 251 lbs Starting date: 12/24/2021  Today's weight: 240 lbs Today's date: 05/19/2022 Total lbs lost to date: 11 lbs Total lbs lost since last in-office visit: 0  Interim History: Denise Austin states she has done well with meal plan. She did go out a few times with friends. Most of the time she goes to [employer], getting seafood boil or fried fish with hush puppies. She has upcoming plans for Labor Day weekend but nothing until then.     Subjective:   1. Vitamin D deficiency Denise Austin denies any nausea, vomiting or muscle weakness. She notes fatigue.  2. Prediabetes Denise Austin's on Saxenda with minium GI side effects. Her last A1c was 5.7 in May 2023. Still craving carbs.   Assessment/Plan:   1. Vitamin D deficiency We will obtain labs today. We will refill Vit D 50,000 IU once a week for 1 month with 0 refills.  - VITAMIN D 25 Hydroxy (Vit-D Deficiency, Fractures)  -Refill Vitamin D, Ergocalciferol, (DRISDOL) 1.25 MG (50000 UNIT) CAPS capsule; Take 1 capsule (50,000 Units total) by mouth every 7 (seven) days.  Dispense: 4 capsule; Refill: 0  2. Prediabetes We will obtain labs today. Increase Saxenda to 1.5 mg for 1 week, and if able to get all food in will increase to 1.8 mg.  - Comprehensive metabolic panel - Insulin, random  3. Obesity with current BMI of 40.0 Denise Austin is currently in the action stage of change. As such, her goal is to continue with weight loss efforts. She has agreed to the Category 3 Plan and the [place] + 300.   Exercise goals: All adults should  avoid inactivity. Some physical activity is better than none, and adults who participate in any amount of physical activity gain some health benefits.  Behavioral modification strategies: increasing lean protein intake, meal planning and cooking strategies, keeping healthy foods in the home, and planning for success.  Denise Austin has agreed to follow-up with our clinic in 4 weeks. She was informed of the importance of frequent follow-up visits to maximize her success with intensive lifestyle modifications for her multiple health conditions.   Kalle was informed we would discuss her lab results at her next visit unless there is a critical issue that needs to be addressed sooner. Denise Austin agreed to keep her next visit at the agreed upon time to discuss these results.  Objective:   Blood pressure 117/79, pulse 69, temperature 98 F (36.7 C), height '5\' 5"'$  (1.651 m), weight 240 lb (108.9 kg), last menstrual period 04/02/2018, SpO2 99 %. Body mass index is 39.94 kg/m.  General: Cooperative, alert, well developed, in no acute distress. HEENT: Conjunctivae and lids unremarkable. Cardiovascular: Regular rhythm.  Lungs: Normal work of breathing. Neurologic: No focal deficits.   Lab Results  Component Value Date   CREATININE 0.87 03/21/2022   BUN 21 03/21/2022   NA 143 03/21/2022   K 3.9 03/21/2022   CL 107 03/21/2022   CO2 29 03/21/2022   Lab Results  Component Value Date   ALT 13 12/24/2021   AST 14 12/24/2021   ALKPHOS 90  12/24/2021   BILITOT 0.5 12/24/2021   Lab Results  Component Value Date   HGBA1C 5.7 03/21/2022   HGBA1C 6.2 (H) 12/24/2021   HGBA1C 6.3 (H) 09/13/2021   HGBA1C 6.1 (H) 01/25/2021   HGBA1C 5.9 01/22/2018   Lab Results  Component Value Date   INSULIN 11.6 12/24/2021   Lab Results  Component Value Date   TSH 1.050 12/24/2021   Lab Results  Component Value Date   CHOL 136 12/24/2021   HDL 50 12/24/2021   LDLCALC 72 12/24/2021   TRIG 72 12/24/2021    CHOLHDL 2.7 09/13/2021   Lab Results  Component Value Date   VD25OH 29.4 (L) 12/24/2021   Lab Results  Component Value Date   WBC 6.8 12/24/2021   HGB 13.7 12/24/2021   HCT 41.4 12/24/2021   MCV 92 12/24/2021   PLT 291 12/24/2021   No results found for: "IRON", "TIBC", "FERRITIN"  Attestation Statements:   Reviewed by clinician on day of visit: allergies, medications, problem list, medical history, surgical history, family history, social history, and previous encounter notes.  I, Elnora Morrison, RMA am acting as transcriptionist for Coralie Common, MD.  I have reviewed the above documentation for accuracy and completeness, and I agree with the above. - Coralie Common, MD

## 2022-05-23 ENCOUNTER — Other Ambulatory Visit (INDEPENDENT_AMBULATORY_CARE_PROVIDER_SITE_OTHER): Payer: No Typology Code available for payment source

## 2022-05-23 DIAGNOSIS — R739 Hyperglycemia, unspecified: Secondary | ICD-10-CM | POA: Diagnosis not present

## 2022-05-23 DIAGNOSIS — E559 Vitamin D deficiency, unspecified: Secondary | ICD-10-CM | POA: Diagnosis not present

## 2022-05-23 LAB — COMPREHENSIVE METABOLIC PANEL
ALT: 15 U/L (ref 0–35)
AST: 15 U/L (ref 0–37)
Albumin: 4.2 g/dL (ref 3.5–5.2)
Alkaline Phosphatase: 75 U/L (ref 39–117)
BUN: 21 mg/dL (ref 6–23)
CO2: 28 mEq/L (ref 19–32)
Calcium: 9.2 mg/dL (ref 8.4–10.5)
Chloride: 105 mEq/L (ref 96–112)
Creatinine, Ser: 0.83 mg/dL (ref 0.40–1.20)
GFR: 81.42 mL/min (ref >60.00)
Glucose, Bld: 99 mg/dL (ref 70–99)
Potassium: 4 mEq/L (ref 3.5–5.1)
Sodium: 140 mEq/L (ref 135–145)
Total Bilirubin: 0.6 mg/dL (ref 0.2–1.2)
Total Protein: 6.7 g/dL (ref 6.0–8.3)

## 2022-05-23 LAB — VITAMIN D 25 HYDROXY (VIT D DEFICIENCY, FRACTURES): VITD: 34.72 ng/mL (ref 30.00–100.00)

## 2022-05-24 LAB — INSULIN AND C-PEPTIDE, SERUM
C-Peptide: 2.9 ng/mL (ref 1.1–4.4)
INSULIN: 16.3 u[IU]/mL (ref 2.6–24.9)

## 2022-05-26 ENCOUNTER — Other Ambulatory Visit (INDEPENDENT_AMBULATORY_CARE_PROVIDER_SITE_OTHER): Payer: Self-pay | Admitting: Family

## 2022-05-26 ENCOUNTER — Other Ambulatory Visit (HOSPITAL_COMMUNITY): Payer: Self-pay

## 2022-05-26 MED ORDER — GABAPENTIN 300 MG PO CAPS
300.0000 mg | ORAL_CAPSULE | Freq: Two times a day (BID) | ORAL | 2 refills | Status: DC
  Filled 2022-05-26: qty 60, 30d supply, fill #0
  Filled 2022-07-09 – 2022-07-17 (×2): qty 60, 30d supply, fill #1
  Filled 2022-09-26: qty 60, 30d supply, fill #2

## 2022-05-30 ENCOUNTER — Ambulatory Visit (INDEPENDENT_AMBULATORY_CARE_PROVIDER_SITE_OTHER): Payer: No Typology Code available for payment source | Admitting: Student

## 2022-05-30 VITALS — Wt 237.4 lb

## 2022-05-30 DIAGNOSIS — M542 Cervicalgia: Secondary | ICD-10-CM | POA: Diagnosis not present

## 2022-05-30 DIAGNOSIS — R21 Rash and other nonspecific skin eruption: Secondary | ICD-10-CM

## 2022-05-30 DIAGNOSIS — M549 Dorsalgia, unspecified: Secondary | ICD-10-CM

## 2022-05-30 DIAGNOSIS — N62 Hypertrophy of breast: Secondary | ICD-10-CM | POA: Diagnosis not present

## 2022-05-30 NOTE — Progress Notes (Cosign Needed Addendum)
   Referring Provider Debbrah Alar, NP [address] [place],  [place]ise [place] [address]   CC: No chief complaint on file.     Denise Austin is an 51 y.o. female.  HPI: Patient is a 51 y.o. year old female here for follow up after completing physical therapy for pain related to macromastia.   She was seen for initial consult by Dr. Marla Roe on 12/02/2021.  At that time, patient reported mammary hyperplasia for several years.  She complained of symptoms including back pain, neck pain and activities being hindered by her enlarged breast such as exercise and running.  Patient expressed the desire to pursue surgical intervention.  Physical therapy was ordered for the patient.  Per patient's chart, her last mammogram was 09/24/2021.  Results were BI-RADS Category 1: Negative, no evidence of malignancy  Today, patient reports she is doing really well.  She states that she has been going to physical therapy and that it has helped a little bit, but she states she is still having pain.  She complains of back pain, neck pain and shoulder pain.  Patient states that she also gets rashes from time to time underneath her breasts.  Patient states she is interested in pursuing surgical intervention.  Patient reports that she has been seeing the healthy weight and wellness team for weight loss.  Since patient's consult back in February, patient has lost 20 pounds.  Patient states she has been focusing on her diet and exercise.  Patient denies any recent changes in her health or new medical diagnoses.  She denies any recent fevers, chills or shortness of breath.   Review of Systems General: Denies fevers, chills or shortness of breath MSK: Endorses ongoing back and neck discomfort Skin: Reports intermittent rashes  Physical Exam    05/19/2022    7:00 AM 04/03/2022   11:00 AM 03/21/2022   11:27 AM  Vitals with BMI  Height '5\' 5"'$  '5\' 5"'$    Weight 240 lbs 238 lbs   BMI 29.56 21.30    Systolic [phone]  Diastolic 79 79 86  Pulse 69 77     General:  No acute distress,  Alert and oriented, Non-Toxic, Normal speech and affect Psych: Normal behavior and mood Respiratory: No increased WOB MSK: Ambulatory  Assessment/Plan  Patient is interested in pursuing surgical intervention for bilateral breast reduction. Patient has completed at least 6 weeks of physical therapy for pain related to macromastia.  Discussed with patient we would submit to insurance for authorization, discussed approval could take up to 6 weeks.   Clance Boll 05/30/2022, 9:18 AM

## 2022-06-04 ENCOUNTER — Encounter (INDEPENDENT_AMBULATORY_CARE_PROVIDER_SITE_OTHER): Payer: Self-pay

## 2022-06-11 ENCOUNTER — Other Ambulatory Visit (HOSPITAL_COMMUNITY): Payer: Self-pay

## 2022-06-16 ENCOUNTER — Encounter (INDEPENDENT_AMBULATORY_CARE_PROVIDER_SITE_OTHER): Payer: Self-pay | Admitting: Family Medicine

## 2022-06-16 ENCOUNTER — Other Ambulatory Visit (HOSPITAL_COMMUNITY): Payer: Self-pay

## 2022-06-16 ENCOUNTER — Ambulatory Visit (INDEPENDENT_AMBULATORY_CARE_PROVIDER_SITE_OTHER): Payer: No Typology Code available for payment source | Admitting: Family Medicine

## 2022-06-16 VITALS — BP 108/72 | HR 80 | Temp 97.9°F | Ht 65.0 in | Wt 235.0 lb

## 2022-06-16 DIAGNOSIS — E559 Vitamin D deficiency, unspecified: Secondary | ICD-10-CM | POA: Diagnosis not present

## 2022-06-16 DIAGNOSIS — E669 Obesity, unspecified: Secondary | ICD-10-CM | POA: Diagnosis not present

## 2022-06-16 DIAGNOSIS — Z6839 Body mass index (BMI) 39.0-39.9, adult: Secondary | ICD-10-CM | POA: Diagnosis not present

## 2022-06-16 DIAGNOSIS — I10 Essential (primary) hypertension: Secondary | ICD-10-CM

## 2022-06-16 MED ORDER — SAXENDA 18 MG/3ML ~~LOC~~ SOPN
3.0000 mg | PEN_INJECTOR | Freq: Every day | SUBCUTANEOUS | 0 refills | Status: DC
  Filled 2022-06-16: qty 15, 30d supply, fill #0

## 2022-06-17 ENCOUNTER — Other Ambulatory Visit: Payer: Self-pay | Admitting: Orthopaedic Surgery

## 2022-06-17 ENCOUNTER — Other Ambulatory Visit (HOSPITAL_COMMUNITY): Payer: Self-pay

## 2022-06-17 MED ORDER — MELOXICAM 7.5 MG PO TABS
7.5000 mg | ORAL_TABLET | Freq: Every day | ORAL | 2 refills | Status: DC | PRN
  Filled 2022-06-17: qty 30, 15d supply, fill #0
  Filled 2022-09-26: qty 30, 15d supply, fill #1
  Filled 2022-11-10 (×2): qty 30, 15d supply, fill #2

## 2022-06-26 NOTE — Progress Notes (Signed)
Chief Complaint:   OBESITY Denise Austin is here to discuss her progress with her obesity treatment plan along with follow-up of her obesity related diagnoses. Denise Austin is on the Category 3 Plan and the [place] and states she is following her eating plan approximately 75% of the time. Denise Austin states she is walking 35-40 minutes 4 times per week.  Today's visit was #: 7 Starting weight: 251 lbs Starting date: 12/24/2021 Today's weight: 235 lbs Today's date: 06/16/2022 Total lbs lost to date: 16 lbs Total lbs lost since last in-office visit: 5  Interim History: Denise Austin is back in office today after her vacation. She had a relaxing beach vacation while away. While away she was less controlled in food intake and choices. Did not feel she did well. Got back on trade when she returned. Her biggest obstacle in next few weeks will be consistency of activity.  Subjective:   1. Essential hypertension Denise Austin is on Cozaar, Toprol. Blood pressure lower than previously. No dizziness or lightheadedness.  2. Vitamin D deficiency Denise Austin is currently taking prescription Vit D 50,000 IU once a week. Level increase from 29.4 to 34.72 over 6 months. Denies any nausea, vomiting or muscle weakness. She notes fatigue.  Assessment/Plan:   1. Essential hypertension Denise Austin will continue current medications without changes in doses, follow up blood pressure at next appointment if same, will decrease medication.  2. Vitamin D deficiency Denise Austin will switch to Cholecalciferol at next appointment.  3. Obesity with current BMI of 39.2 We will refill Saxenda 3 mg SubQ daily for 1 month with 0 refills.  -Refill Liraglutide -Weight Management (SAXENDA) 18 MG/3ML SOPN; Inject 3 mg into the skin daily.  Dispense: 15 mL; Refill: 0  Denise Austin is currently in the action stage of change. As such, her goal is to continue with weight loss efforts. She has agreed to the Category 3 Plan and  the [place].   Exercise goals: As is. Add 10-15 minutes 3 times a week of activity.  Behavioral modification strategies: increasing lean protein intake, meal planning and cooking strategies, keeping healthy foods in the home, and planning for success.  Denise Austin has agreed to follow-up with our clinic in 4 weeks. She was informed of the importance of frequent follow-up visits to maximize her success with intensive lifestyle modifications for her multiple health conditions.   Objective:   Blood pressure 108/72, pulse 80, temperature 97.9 F (36.6 C), height '5\' 5"'$  (1.651 m), weight 235 lb (106.6 kg), last menstrual period 04/02/2018, SpO2 98 %. Body mass index is 39.11 kg/m.  General: Cooperative, alert, well developed, in no acute distress. HEENT: Conjunctivae and lids unremarkable. Cardiovascular: Regular rhythm.  Lungs: Normal work of breathing. Neurologic: No focal deficits.   Lab Results  Component Value Date   CREATININE 0.83 05/23/2022   BUN 21 05/23/2022   NA 140 05/23/2022   K 4.0 05/23/2022   CL 105 05/23/2022   CO2 28 05/23/2022   Lab Results  Component Value Date   ALT 15 05/23/2022   AST 15 05/23/2022   ALKPHOS 75 05/23/2022   BILITOT 0.6 05/23/2022   Lab Results  Component Value Date   HGBA1C 5.7 03/21/2022   HGBA1C 6.2 (H) 12/24/2021   HGBA1C 6.3 (H) 09/13/2021   HGBA1C 6.1 (H) 01/25/2021   HGBA1C 5.9 01/22/2018   Lab Results  Component Value Date   INSULIN 16.3 05/23/2022   INSULIN 11.6 12/24/2021   Lab Results  Component Value Date  TSH 1.050 12/24/2021   Lab Results  Component Value Date   CHOL 136 12/24/2021   HDL 50 12/24/2021   LDLCALC 72 12/24/2021   TRIG 72 12/24/2021   CHOLHDL 2.7 09/13/2021   Lab Results  Component Value Date   VD25OH 34.72 05/23/2022   VD25OH 29.4 (L) 12/24/2021   Lab Results  Component Value Date   WBC 6.8 12/24/2021   HGB 13.7 12/24/2021   HCT 41.4 12/24/2021   MCV 92 12/24/2021   PLT 291  12/24/2021   No results found for: "IRON", "TIBC", "FERRITIN"  Attestation Statements:   Reviewed by clinician on day of visit: allergies, medications, problem list, medical history, surgical history, family history, social history, and previous encounter notes.  I, Elnora Morrison, RMA am acting as transcriptionist for Coralie Common, MD. I have reviewed the above documentation for accuracy and completeness, and I agree with the above. - Coralie Common, MD

## 2022-07-01 ENCOUNTER — Telehealth: Payer: Self-pay | Admitting: *Deleted

## 2022-07-01 NOTE — Telephone Encounter (Signed)
Auth called into [employer] for [place] Wanda Plump [phone] Call Ref: [id number]  Clinicals faxed to case management. Fax Confirmation: This message was sent via [place], a product from [employer] [email or web address]                    -------Fax Transmission Report-------  To:               Recipient at [phone] Subject:          [place] for [facility]. C Result:           The transmission was successful. Explanation:      All Pages Ok Pages Sent:       23 Connect Time:     10 minutes, 15 seconds Transmit Time:    07/01/2022 09:48 Transfer Rate:    14400 Status Code:      0000 Retry Count:      1 Job Id:           [id number] Unique Id:        [id number] Fax Line:         2 Fax Server:       [employer]

## 2022-07-09 ENCOUNTER — Other Ambulatory Visit (HOSPITAL_COMMUNITY): Payer: Self-pay

## 2022-07-17 ENCOUNTER — Other Ambulatory Visit (INDEPENDENT_AMBULATORY_CARE_PROVIDER_SITE_OTHER): Payer: Self-pay | Admitting: Family Medicine

## 2022-07-17 ENCOUNTER — Other Ambulatory Visit (HOSPITAL_COMMUNITY): Payer: Self-pay

## 2022-07-17 DIAGNOSIS — E559 Vitamin D deficiency, unspecified: Secondary | ICD-10-CM

## 2022-07-22 ENCOUNTER — Telehealth (INDEPENDENT_AMBULATORY_CARE_PROVIDER_SITE_OTHER): Payer: Self-pay | Admitting: Family Medicine

## 2022-07-22 ENCOUNTER — Encounter (INDEPENDENT_AMBULATORY_CARE_PROVIDER_SITE_OTHER): Payer: Self-pay

## 2022-07-22 NOTE — Telephone Encounter (Signed)
Dr. Jearld Shines - Prior authorization approved for Saxenda. Effective: 07/22/2022 to 07/22/2023. Patient sent approval message via mychart.

## 2022-07-28 ENCOUNTER — Encounter: Payer: Self-pay | Admitting: Family

## 2022-07-31 ENCOUNTER — Ambulatory Visit (INDEPENDENT_AMBULATORY_CARE_PROVIDER_SITE_OTHER): Payer: No Typology Code available for payment source | Admitting: Family Medicine

## 2022-08-05 ENCOUNTER — Ambulatory Visit (INDEPENDENT_AMBULATORY_CARE_PROVIDER_SITE_OTHER): Payer: No Typology Code available for payment source | Admitting: Family Medicine

## 2022-08-22 ENCOUNTER — Encounter: Payer: No Typology Code available for payment source | Admitting: Surgical

## 2022-08-27 ENCOUNTER — Other Ambulatory Visit: Payer: Self-pay | Admitting: Family

## 2022-08-27 DIAGNOSIS — Z1231 Encounter for screening mammogram for malignant neoplasm of breast: Secondary | ICD-10-CM

## 2022-08-28 ENCOUNTER — Encounter (HOSPITAL_BASED_OUTPATIENT_CLINIC_OR_DEPARTMENT_OTHER): Payer: Self-pay

## 2022-08-28 ENCOUNTER — Ambulatory Visit (HOSPITAL_BASED_OUTPATIENT_CLINIC_OR_DEPARTMENT_OTHER): Admit: 2022-08-28 | Payer: No Typology Code available for payment source | Admitting: Plastic Surgery

## 2022-08-28 SURGERY — BREAST REDUCTION WITH LIPOSUCTION
Anesthesia: General | Site: Breast | Laterality: Bilateral

## 2022-09-09 ENCOUNTER — Encounter: Payer: No Typology Code available for payment source | Admitting: Surgical

## 2022-09-26 ENCOUNTER — Ambulatory Visit (INDEPENDENT_AMBULATORY_CARE_PROVIDER_SITE_OTHER): Payer: No Typology Code available for payment source | Admitting: Family

## 2022-09-26 ENCOUNTER — Other Ambulatory Visit (HOSPITAL_COMMUNITY): Payer: Self-pay

## 2022-09-26 VITALS — BP 140/88 | HR 82 | Temp 98.1°F | Resp 18 | Ht 65.0 in | Wt 234.0 lb

## 2022-09-26 DIAGNOSIS — E559 Vitamin D deficiency, unspecified: Secondary | ICD-10-CM | POA: Diagnosis not present

## 2022-09-26 DIAGNOSIS — Z0000 Encounter for general adult medical examination without abnormal findings: Secondary | ICD-10-CM

## 2022-09-26 DIAGNOSIS — R7303 Prediabetes: Secondary | ICD-10-CM | POA: Diagnosis not present

## 2022-09-26 DIAGNOSIS — I10 Essential (primary) hypertension: Secondary | ICD-10-CM

## 2022-09-26 DIAGNOSIS — G4733 Obstructive sleep apnea (adult) (pediatric): Secondary | ICD-10-CM

## 2022-09-26 DIAGNOSIS — B009 Herpesviral infection, unspecified: Secondary | ICD-10-CM

## 2022-09-26 DIAGNOSIS — K219 Gastro-esophageal reflux disease without esophagitis: Secondary | ICD-10-CM

## 2022-09-26 DIAGNOSIS — R739 Hyperglycemia, unspecified: Secondary | ICD-10-CM

## 2022-09-26 LAB — COMPREHENSIVE METABOLIC PANEL
ALT: 13 U/L (ref 0–35)
AST: 14 U/L (ref 0–37)
Albumin: 4.1 g/dL (ref 3.5–5.2)
Alkaline Phosphatase: 62 U/L (ref 39–117)
BUN: 19 mg/dL (ref 6–23)
CO2: 28 mEq/L (ref 19–32)
Calcium: 9.1 mg/dL (ref 8.4–10.5)
Chloride: 105 mEq/L (ref 96–112)
Creatinine, Ser: 0.79 mg/dL (ref 0.40–1.20)
GFR: 86.18 mL/min (ref >60.00)
Glucose, Bld: 120 mg/dL — ABNORMAL HIGH (ref 70–99)
Potassium: 4.1 mEq/L (ref 3.5–5.1)
Sodium: 140 mEq/L (ref 135–145)
Total Bilirubin: 0.7 mg/dL (ref 0.2–1.2)
Total Protein: 6.5 g/dL (ref 6.0–8.3)

## 2022-09-26 LAB — VITAMIN D 25 HYDROXY (VIT D DEFICIENCY, FRACTURES): VITD: 31.2 ng/mL (ref 30.00–100.00)

## 2022-09-26 LAB — HEMOGLOBIN A1C: Hgb A1c MFr Bld: 5.8 % (ref 4.6–6.5)

## 2022-09-26 MED ORDER — METOPROLOL SUCCINATE ER 100 MG PO TB24
100.0000 mg | ORAL_TABLET | Freq: Every day | ORAL | 1 refills | Status: DC
  Filled 2022-09-26: qty 90, 90d supply, fill #0
  Filled 2023-01-12: qty 90, 90d supply, fill #1

## 2022-09-26 MED ORDER — LOSARTAN POTASSIUM 50 MG PO TABS
50.0000 mg | ORAL_TABLET | Freq: Every day | ORAL | 1 refills | Status: DC
  Filled 2022-09-26: qty 90, 90d supply, fill #0

## 2022-09-26 MED ORDER — VALACYCLOVIR HCL 500 MG PO TABS
500.0000 mg | ORAL_TABLET | Freq: Every day | ORAL | 1 refills | Status: DC
  Filled 2022-09-26: qty 90, 90d supply, fill #0
  Filled 2023-02-13: qty 90, 90d supply, fill #1
  Filled 2023-02-20: qty 90, 90d supply, fill #0

## 2022-09-26 NOTE — Assessment & Plan Note (Signed)
Lab Results  Component Value Date   HGBA1C 5.7 03/21/2022   Last A1C was WNL.  Will see how it is off of ozempic.

## 2022-09-26 NOTE — Assessment & Plan Note (Signed)
Continue work on [employer], exercise and weight loss. Colo, pap, mammo up to date.  Declines shingrix and covid booster.

## 2022-09-26 NOTE — Progress Notes (Signed)
Subjective:     Patient ID: Denise Austin, female    DOB: 05-Sep-1971, 51 y.o.   MRN: [id number]  Chief Complaint  Patient presents with   Annual Exam    HPI  Patient presents today for complete physical.  Immunizations: declines shingrix, declines covid booster Colonoscopy:2022 Pap Smear: UTD Mammogram: scheduled Vision: up to date Dental: up to date  HTN- bp meds include toprol xl 61m and losartan 543m   OSA- reports good compliance with cpap. Feels rested during the day.   HSV2- stable on daily suppressive therapy with valtrex.  GERD- rarely needing to use prn omeprazole.     Health Maintenance Due  Topic Date Due   COVID-19 Vaccine (1) Never done   Zoster Vaccines- Shingrix (2 of 2) 05/16/2022   INFLUENZA VACCINE  05/27/2022   MAMMOGRAM  09/24/2022    Past Medical History:  Diagnosis Date   Allergy    GERD (gastroesophageal reflux disease)    Hyperglycemia    Hypertension    OSA (obstructive sleep apnea) 04/15/2018   PONV (postoperative nausea and vomiting)    Sleep apnea    wears cpap    Uterine fibroid     Past Surgical History:  Procedure Laterality Date   BUNIONECTOMY     Lt-01/07/06, Rt-09/15/07   CHOLECYSTECTOMY     CYST REMOVAL HAND     Left Wrist   LUMBAR LAMINECTOMY/DECOMPRESSION MICRODISCECTOMY Right 04/22/2017   Procedure: Extraforaminal Microdiscectomy - Lumbar two-Lumbar three - right;  Surgeon: JoEustace MooreMD;  [facility]: [facility];  Laterality: Right;   lumbar surgery revision  04/24/2017   WOUND EXPLORATION N/A 05/22/2017   Procedure: Revision Of Lumbar Wound;  Surgeon: JoEustace MooreMD;  [facility]: Neurosurgery;  Laterality: N/A;  Lumbar wound revision    Family History  Problem Relation Age of Onset   Hypertension Mother    Diabetes Mother    Sleep apnea Mother    Obesity Mother    Stroke Father    Hypertension Father        smoker   COPD Father    Single kidney Sister         removed as a child   Heart attack Neg Hx    Hyperlipidemia Neg Hx    Sudden death Neg Hx    Colon cancer Neg Hx    Colon polyps Neg Hx    Esophageal cancer Neg Hx    Stomach cancer Neg Hx    Rectal cancer Neg Hx     Social History   Socioeconomic History   Marital status: Single    Spouse name: Not on file   Number of children: Not on file   Years of education: Not on file   Highest education level: Not on file  Occupational History   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  Other Topics Concern   Not on file  Social History Narrative   Has 2 children (2 boys)- [place]   3 grandchildren 2 boys, 1 granddaughter  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 Gummy vitamin     gabapentin (NEURONTIN) 300 MG capsule Take 1 capsule (300 mg total) by mouth 2 (two) times daily. 60 capsule 2   omeprazole (PRILOSEC) 40 MG capsule Take 1 capsule (40 mg total) by mouth daily as needed. 90 capsule 1   Insulin Pen Needle (BD PEN NEEDLE NANO 2ND GEN) 32G X 4 MM MISC Use 2 times daily 100 each 0   Liraglutide -Weight Management (SAXENDA) 18  MG/3ML SOPN Inject 3 mg into the skin daily. 15 mL 0   losartan (COZAAR) 50 MG tablet Take 1 tablet (50 mg total) by mouth daily. 90 tablet 1   meloxicam (MOBIC) 7.5 MG tablet Take 1-2 tablets (7.5-15 mg total) by mouth daily as needed for pain. 30 tablet 2   metoprolol succinate (TOPROL-XL) 25 MG 24 hr tablet Take 3 tablets (75 mg total) by mouth daily. 270 tablet 0   valACYclovir (VALTREX) 500 MG tablet Take 1 tablet (500 mg total) by mouth daily. 90 tablet 1   Vitamin D, Ergocalciferol, (DRISDOL) 1.25 MG (50000 UNIT) CAPS capsule Take 1 capsule (50,000 Units total) by mouth every 7 (seven) days. 4 capsule 0   No facility-administered medications prior to visit.    No Known Allergies  ROS Review of Systems  Constitutional: Negative for unexpected weight change.  HENT: Negative for hearing loss and rhinorrhea.   Eyes: Negative for visual disturbance.  Respiratory: Negative for cough.   Cardiovascular: Negative for chest pain and leg swelling.  Gastrointestinal: Negative for nausea, vomiting, diarrhea and blood in stool. Does experience constipation sometimes- uses miralax prn Genitourinary: Negative for dysuria and frequency.  Musculoskeletal: Negative for myalgias and arthralgias.  Skin: Negative for rash.  Neurological: Negative for headaches.  Hematological: Negative for adenopathy.  Psychiatric/Behavioral: Denies depression or anxiety     Objective:    Physical Exam  BP (!) 140/88   Pulse 82   Temp 98.1 F (36.7 C)   Resp 18   Ht _0  (1.651 m)   Wt 234 lb (106.1 kg)   LMP 04/02/2018   SpO2 94%   BMI 38.94 kg/m  Wt Readings from Last 3 Encounters:  09/26/22 234 lb (106.1 kg)  06/16/22 235 lb (106.6 kg)  05/30/22 237 lb 6.4 oz (107.7 kg)   Physical Exam  Constitutional: She is oriented to person, place, and time. She appears well-developed and well-nourished. No distress.  HENT:  Head: Normocephalic and atraumatic.  Right Ear: Tympanic membrane and ear canal  normal.  Left Ear: Tympanic membrane and ear canal normal.  Mouth/Throat: Oropharynx is clear and moist.  Eyes: Pupils are equal, round, and reactive to light. No scleral icterus.  Neck: Normal range of motion. No thyromegaly present.  Cardiovascular: Normal rate and regular rhythm.   No murmur heard. Pulmonary/Chest: Effort normal and breath sounds normal. No respiratory distress. He has no wheezes. She has no rales. She  exhibits no tenderness.  Abdominal: Soft. Bowel sounds are normal. She exhibits no distension and no mass. There is no tenderness. There is no rebound and no guarding.  Musculoskeletal: She exhibits no edema.  Lymphadenopathy:    She has no cervical adenopathy.  Neurological: She is alert and oriented to person, place, and time. She has normal patellar reflexes. She exhibits normal muscle tone. Coordination normal.  Skin: Skin is warm and dry.  Psychiatric: She has a normal mood and affect. Her behavior is normal. Judgment and thought content normal.  Breast/pelvic: deferred         Assessment & Plan:       Assessment & Plan:   Problem List Items Addressed This Visit       Unprioritized   Preventative health care    Continue work on healthy diet, exercise and weight loss. Colo, pap, mammo up to date.  Declines shingrix and covid booster.       Prediabetes    Lab Results  Component Value Date   HGBA1C 5.7 03/21/2022  Last A1C was WNL.  Will see how it is off of ozempic.       Relevant Orders   Hemoglobin A1c   OSA (obstructive sleep apnea)    Good compliance, doing well with cpap.       Morbid obesity due to excess calories ([place])    Wt Readings from Last 3 Encounters:  09/26/22 234 lb (106.1 kg)  06/16/22 235 lb (106.6 kg)  05/30/22 237 lb 6.4 oz (107.7 kg)   Lab Results  Component Value Date   HGBA1C 5.7 03/21/2022  She stopped ozempic due to headaches.  She is prefering to focus on diet and exercise.        HTN (hypertension) - Primary     BP Readings from Last 3 Encounters:  09/26/22 (!) 122/90  06/16/22 108/72  05/19/22 117/79  BP stable on losartan 71m, toprol xl 268m      Relevant Medications   metoprolol succinate (TOPROL-XL) 100 MG 24 hr tablet   losartan (COZAAR) 50 MG tablet   Other Relevant Orders   Comp Met (CMET)   Herpes simplex viral infection    Stable. Continue valtrex.       Relevant Medications   valACYclovir (VALTREX) 500 MG tablet   GERD (gastroesophageal reflux disease)    Stable with only prn use of omeprazole.       Other Visit Diagnoses     Vitamin D deficiency       Relevant Orders   Vitamin D (25 hydroxy)       I have discontinued Chakita L. Sabia's metoprolol succinate, BD Pen Needle Nano 2nd Gen, Vitamin D (Ergocalciferol), Saxenda, and meloxicam. I am also having her start on metoprolol succinate. Additionally, I am having her maintain her BIOTIN PO, omeprazole, gabapentin, losartan, and valACYclovir.  Meds ordered this encounter  Medications   metoprolol succinate (TOPROL-XL) 100 MG 24 hr tablet    Sig: Take 1 tablet (100 mg total) by mouth daily. Take with or immediately following a meal.    Dispense:  90 tablet    Refill:  1    Order Specific Question:   Supervising Provider    Answer:   BLPenni Homans [[id number]]   losartan (COZAAR) 50 MG tablet    Sig: Take 1 tablet (50 mg total) by mouth daily.    Dispense:  90 tablet    Refill:  1    Order Specific Question:  Supervising Provider    Answer:   Penni Homans A [[id number]]   valACYclovir (VALTREX) 500 MG tablet    Sig: Take 1 tablet (500 mg total) by mouth daily.    Dispense:  90 tablet    Refill:  1    Order Specific Question:   Supervising Provider    Answer:   Penni Homans A [[id number]]

## 2022-09-26 NOTE — Assessment & Plan Note (Signed)
BP Readings from Last 3 Encounters:  09/26/22 (!) 122/90  06/16/22 108/72  05/19/22 117/79   BP stable on losartan '50mg'$ , toprol xl '25mg'$ .

## 2022-09-26 NOTE — Assessment & Plan Note (Addendum)
Wt Readings from Last 3 Encounters:  09/26/22 234 lb (106.1 kg)  06/16/22 235 lb (106.6 kg)  05/30/22 237 lb 6.4 oz (107.7 kg)   Lab Results  Component Value Date   HGBA1C 5.7 03/21/2022   She stopped ozempic due to headaches.  She is prefering to focus on diet and exercise.

## 2022-09-26 NOTE — Assessment & Plan Note (Signed)
Stable. Continue valtrex.

## 2022-09-26 NOTE — Assessment & Plan Note (Signed)
Stable with only prn use of omeprazole.

## 2022-09-26 NOTE — Assessment & Plan Note (Signed)
Good compliance, doing well with cpap.

## 2022-09-29 ENCOUNTER — Other Ambulatory Visit (HOSPITAL_COMMUNITY): Payer: Self-pay

## 2022-10-14 ENCOUNTER — Other Ambulatory Visit (HOSPITAL_COMMUNITY): Payer: Self-pay

## 2022-10-24 ENCOUNTER — Ambulatory Visit: Payer: No Typology Code available for payment source

## 2022-11-03 ENCOUNTER — Ambulatory Visit: Payer: No Typology Code available for payment source

## 2022-11-05 ENCOUNTER — Other Ambulatory Visit: Payer: Self-pay | Admitting: Family

## 2022-11-05 ENCOUNTER — Ambulatory Visit
Admission: RE | Admit: 2022-11-05 | Discharge: 2022-11-05 | Disposition: A | Discharge status: Home / Self Care | Payer: Commercial Managed Care - PPO | Source: Ambulatory Visit | Attending: Family | Admitting: Family

## 2022-11-05 DIAGNOSIS — Z1231 Encounter for screening mammogram for malignant neoplasm of breast: Secondary | ICD-10-CM

## 2022-11-06 ENCOUNTER — Other Ambulatory Visit (HOSPITAL_COMMUNITY): Payer: Self-pay

## 2022-11-06 MED ORDER — OMEPRAZOLE 40 MG PO CPDR
40.0000 mg | DELAYED_RELEASE_CAPSULE | Freq: Every day | ORAL | 1 refills | Status: DC | PRN
  Filled 2022-11-06 – 2022-11-07 (×2): qty 90, 90d supply, fill #0
  Filled 2023-01-12 – 2023-03-26 (×3): qty 90, 90d supply, fill #1

## 2022-11-07 ENCOUNTER — Other Ambulatory Visit (HOSPITAL_COMMUNITY): Payer: Self-pay

## 2022-11-07 ENCOUNTER — Other Ambulatory Visit (HOSPITAL_BASED_OUTPATIENT_CLINIC_OR_DEPARTMENT_OTHER): Payer: Self-pay

## 2022-11-07 ENCOUNTER — Ambulatory Visit: Payer: Self-pay | Admitting: Family

## 2022-11-07 ENCOUNTER — Ambulatory Visit (INDEPENDENT_AMBULATORY_CARE_PROVIDER_SITE_OTHER): Payer: Commercial Managed Care - PPO | Admitting: Family

## 2022-11-07 VITALS — BP 140/88 | HR 58 | Temp 97.9°F | Resp 16 | Wt 237.0 lb

## 2022-11-07 DIAGNOSIS — I10 Essential (primary) hypertension: Secondary | ICD-10-CM

## 2022-11-07 DIAGNOSIS — J020 Streptococcal pharyngitis: Secondary | ICD-10-CM | POA: Insufficient documentation

## 2022-11-07 DIAGNOSIS — J029 Acute pharyngitis, unspecified: Secondary | ICD-10-CM | POA: Diagnosis not present

## 2022-11-07 LAB — POC COVID19 BINAXNOW: SARS Coronavirus 2 Ag: NEGATIVE

## 2022-11-07 LAB — POCT RAPID STREP A (OFFICE): Rapid Strep A Screen: POSITIVE — AB

## 2022-11-07 MED ORDER — LOSARTAN POTASSIUM 100 MG PO TABS
100.0000 mg | ORAL_TABLET | Freq: Every day | ORAL | 1 refills | Status: DC
  Filled 2022-11-07: qty 90, 90d supply, fill #0

## 2022-11-07 MED ORDER — FLUCONAZOLE 150 MG PO TABS
150.0000 mg | ORAL_TABLET | Freq: Once | ORAL | 0 refills | Status: AC
  Filled 2022-11-07: qty 1, 1d supply, fill #0

## 2022-11-07 MED ORDER — AMOXICILLIN 500 MG PO CAPS
500.0000 mg | ORAL_CAPSULE | Freq: Three times a day (TID) | ORAL | 0 refills | Status: AC
  Filled 2022-11-07: qty 30, 10d supply, fill #0

## 2022-11-07 NOTE — Assessment & Plan Note (Signed)
New. Rx with amoxicillin. Tylenol or motrin as needed for pain. Requested diflucan as she is prone to vaginal yeast infections.

## 2022-11-07 NOTE — Assessment & Plan Note (Signed)
BP remains above goal. Will increase losartan to '100mg'$  once daily. Continue current dose of metoprolol.

## 2022-11-07 NOTE — Progress Notes (Signed)
Subjective:     Patient ID: Denise Austin, female    DOB: [birth year], 52 y.o.   MRN: [id number]  Chief Complaint  Patient presents with   Hypertension    Here for follow up    Hypertension   Patient is in today for follow up of her blood pressure.    HTN- initial bp upon arrival 149/85, second check below.  BP Readings from Last 3 Encounters:  11/07/22 (!) 140/88  09/26/22 (!) 140/88  06/16/22 108/72   She is maintained on losartan and metoprolol '100mg'$ .  She also reports a 2-3 day history of cough and bad sore throat.   Health Maintenance Due  Topic Date Due   COVID-19 Vaccine (1) Never done   Zoster Vaccines- Shingrix (2 of 2) 05/16/2022    Past Medical History:  Diagnosis Date   Allergy    GERD (gastroesophageal reflux disease)    Hyperglycemia    Hypertension    OSA (obstructive sleep apnea) 04/15/2018   PONV (postoperative nausea and vomiting)    Sleep apnea    wears cpap    Uterine fibroid     Past Surgical History:  Procedure Laterality Date   BUNIONECTOMY     Lt-01/07/06, Rt-09/15/07   CHOLECYSTECTOMY     CYST REMOVAL HAND     Left Wrist   LUMBAR LAMINECTOMY/DECOMPRESSION MICRODISCECTOMY Right 04/22/2017   Procedure: Extraforaminal Microdiscectomy - Lumbar two-Lumbar three - right;  Surgeon: Eustace Moore, MD;  Location: [facility];  Service: Neurosurgery;  Laterality: Right;   lumbar surgery revision  04/24/2017   WOUND EXPLORATION N/A 05/22/2017   Procedure: Revision Of Lumbar Wound;  Surgeon: Eustace Moore, MD;  Location: [facility];  Service: Neurosurgery;  Laterality: N/A;  Lumbar wound revision    Family History  Problem Relation Age of Onset   Hypertension Mother    Diabetes Mother    Sleep apnea Mother    Obesity Mother    Stroke Father    Hypertension Father        smoker   COPD Father    Single kidney Sister        removed as a child   Heart attack Neg Hx    Hyperlipidemia Neg Hx    Sudden death Neg Hx    Colon cancer Neg Hx     Colon polyps Neg Hx    Esophageal cancer Neg Hx    Stomach cancer Neg Hx    Rectal cancer Neg Hx     Social History   Socioeconomic History   Marital status: Single    Spouse name: Not on file   Number of children: Not on file   Years of education: Not on file   Highest education level: Not on file  Occupational History   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  Other Topics Concern   Not on file  Social History Narrative   Has 2 children (2 boys)- one son in [place] and one is local   3 grandchildren 2 boys, 1 granddaughter   Works for [employer] and surgery  scheduling   Engaged    Lives with fiance   Enjoys shopping, reading, spending time with mom and sisters   Social Determinants of Health   Financial Resource Strain: Not on file  Food Insecurity: Not on file  Transportation Needs: Not on file  Physical Activity: Not on file  Stress: Not on file  Social Connections: Not on file  Intimate Partner Violence: Not on file    Outpatient Medications Prior to Visit  Medication Sig Dispense Refill   BIOTIN PO Take 1 tablet by mouth daily. Gummy vitamin     gabapentin (NEURONTIN) 300 MG capsule Take 1 capsule (300 mg total) by mouth 2 (two) times daily. 60 capsule 2   meloxicam (MOBIC) 7.5 MG tablet Take 1-2 tablets (7.5-15 mg total) by mouth daily as needed for pain. 30 tablet 2   metoprolol succinate (TOPROL-XL) 100 MG 24 hr tablet Take 1 tablet (100 mg total) by mouth daily. Take with or immediately following a meal. 90 tablet 1   omeprazole (PRILOSEC) 40 MG capsule Take 1 capsule (40 mg total) by mouth daily as needed. 90 capsule  1   valACYclovir (VALTREX) 500 MG tablet Take 1 tablet (500 mg total) by mouth daily. 90 tablet 1   losartan (COZAAR) 50 MG tablet Take 1 tablet (50 mg total) by mouth daily. 90 tablet 1   No facility-administered medications prior to visit.    No Known Allergies  ROS    See HPI Objective:    Physical Exam Vitals reviewed.  Constitutional:      General: She is not in acute distress.    Appearance: Normal appearance. She is well-developed.  HENT:     Head: Normocephalic and atraumatic.     Right Ear: External ear normal.     Left Ear: External ear normal.     Mouth/Throat:     Mouth: Mucous membranes are moist.     Pharynx: Posterior oropharyngeal erythema present. No pharyngeal swelling.     Tonsils: No tonsillar exudate or tonsillar abscesses. 2+ on the right. 2+ on the left.  Eyes:     General: No scleral icterus. Neck:     Thyroid: No thyromegaly.  Cardiovascular:     Rate and Rhythm: Normal rate and regular rhythm.     Heart sounds: Normal heart sounds. No murmur heard. Pulmonary:     Effort: Pulmonary effort is normal. No respiratory distress.     Breath sounds: Normal breath sounds. No wheezing.  Musculoskeletal:     Cervical back: Neck supple.  Skin:    General: Skin is warm and dry.  Neurological:     Mental Status: She is alert and oriented to person, place, and time.  Psychiatric:        Mood and Affect: Mood normal.        Behavior: Behavior normal.        Thought Content: Thought content normal.        Judgment: Judgment normal.     BP (!) 140/88   Pulse (!) 58   Temp 97.9 F (36.6 C) (Oral)   Resp 16   Wt 237 lb (107.5 kg)   LMP 04/02/2018   SpO2 100%   BMI 39.44 kg/m  Wt Readings from Last 3 Encounters:  11/07/22 237 lb (107.5 kg)  09/26/22 234 lb (106.1 kg)  06/16/22 235 lb (106.6 kg)       Assessment & Plan:   Problem List Items Addressed This Visit  Unprioritized   Strep pharyngitis    New. Rx with amoxicillin. Tylenol  or motrin as needed for pain. Requested diflucan as she is prone to vaginal yeast infections.       Relevant Medications   fluconazole (DIFLUCAN) 150 MG tablet   HTN (hypertension) - Primary    BP remains above goal. Will increase losartan to '100mg'$  once daily. Continue current dose of metoprolol.       Relevant Medications   losartan (COZAAR) 100 MG tablet   Other Visit Diagnoses     Sore throat       Relevant Orders   POC COVID-19 (Completed)   POCT rapid strep A (Completed)       I have discontinued Linzey L. Van's losartan. I am also having her start on losartan, amoxicillin, and fluconazole. Additionally, I am having her maintain her BIOTIN PO, gabapentin, meloxicam, metoprolol succinate, valACYclovir, and omeprazole.  Meds ordered this encounter  Medications   losartan (COZAAR) 100 MG tablet    Sig: Take 1 tablet (100 mg total) by mouth daily.    Dispense:  90 tablet    Refill:  1    Order Specific Question:   Supervising Provider    Answer:   Penni Homans A [[id number]]   amoxicillin (AMOXIL) 500 MG capsule    Sig: Take 1 capsule (500 mg total) by mouth 3 (three) times daily for 10 days.    Dispense:  30 capsule    Refill:  0    Order Specific Question:   Supervising Provider    Answer:   Penni Homans A [[id number]]   fluconazole (DIFLUCAN) 150 MG tablet    Sig: Take 1 tablet (150 mg total) by mouth once for 1 dose. As needed for vaginal yeast infection    Dispense:  1 tablet    Refill:  0    Order Specific Question:   Supervising Provider    Answer:   Penni Homans A [[id number]]

## 2022-11-10 ENCOUNTER — Other Ambulatory Visit (HOSPITAL_COMMUNITY): Payer: Self-pay

## 2022-11-24 ENCOUNTER — Ambulatory Visit: Payer: Commercial Managed Care - PPO | Admitting: Family

## 2022-11-28 ENCOUNTER — Ambulatory Visit: Payer: Commercial Managed Care - PPO | Admitting: Family

## 2022-11-28 ENCOUNTER — Other Ambulatory Visit (HOSPITAL_BASED_OUTPATIENT_CLINIC_OR_DEPARTMENT_OTHER): Payer: Self-pay

## 2022-11-28 VITALS — BP 138/95 | HR 57 | Temp 98.2°F | Resp 16 | Wt 238.0 lb

## 2022-11-28 DIAGNOSIS — I10 Essential (primary) hypertension: Secondary | ICD-10-CM | POA: Diagnosis not present

## 2022-11-28 DIAGNOSIS — J020 Streptococcal pharyngitis: Secondary | ICD-10-CM

## 2022-11-28 MED ORDER — LOSARTAN POTASSIUM-HCTZ 100-25 MG PO TABS
1.0000 | ORAL_TABLET | Freq: Every day | ORAL | 0 refills | Status: DC
  Filled 2022-11-28: qty 30, 30d supply, fill #0

## 2022-11-28 NOTE — Progress Notes (Signed)
Subjective:     Patient ID: Denise Austin, female    DOB: 1971/05/05, 52 y.o.   MRN: [id number]  Chief Complaint  Patient presents with   Hypertension    Here for follow up    HPI Patient is in today for follow up.  HTN- last visit we had her continue metoprolol and we increased her losartan from '50mg'$  to '100mg'$ . She is tolerating this new dose.  BP Readings from Last 3 Encounters:  11/28/22 (!) 138/95  11/07/22 (!) 140/88  09/26/22 (!) 140/88   Reports resolution of strep throat symptoms she was experiencing last visit.   Health Maintenance Due  Topic Date Due   COVID-19 Vaccine (1) Never done   Zoster Vaccines- Shingrix (2 of 2) 05/16/2022    Past Medical History:  Diagnosis Date   Allergy    GERD (gastroesophageal reflux disease)    Hyperglycemia    Hypertension    OSA (obstructive sleep apnea) 04/15/2018   PONV (postoperative nausea and vomiting)    Sleep apnea    wears cpap    Uterine fibroid     Past Surgical History:  Procedure Laterality Date   BUNIONECTOMY     Lt-01/07/06, Rt-09/15/07   CHOLECYSTECTOMY     CYST REMOVAL HAND     Left Wrist   LUMBAR LAMINECTOMY/DECOMPRESSION MICRODISCECTOMY Right 04/22/2017   Procedure: Extraforaminal Microdiscectomy - Lumbar two-Lumbar three - right;  Surgeon: Eustace Moore, MD;  Location: [facility];  Service: Neurosurgery;  Laterality: Right;   lumbar surgery revision  04/24/2017   WOUND EXPLORATION N/A 05/22/2017   Procedure: Revision Of Lumbar Wound;  Surgeon: Eustace Moore, MD;  Location: [facility];  Service: Neurosurgery;  Laterality: N/A;  Lumbar wound revision    Family History  Problem Relation Age of Onset   Hypertension Mother    Diabetes Mother    Sleep apnea Mother    Obesity Mother    Stroke Father    Hypertension Father        smoker   COPD Father    Single kidney Sister        removed as a child   Heart attack Neg Hx    Hyperlipidemia Neg Hx    Sudden death Neg Hx    Colon cancer Neg Hx     Colon polyps Neg Hx    Esophageal cancer Neg Hx    Stomach cancer Neg Hx    Rectal cancer Neg Hx     Social History   Socioeconomic History   Marital status: Single    Spouse name: Not on file   Number of children: Not on file   Years of education: Not on file   Highest education level: Not on file  Occupational History   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  Other Topics Concern   Not on file  Social History Narrative   Has 2 children (2 boys)- one son in [place] and one is local   3 grandchildren 2 boys, 1 granddaughter   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 Gummy vitamin     gabapentin (NEURONTIN) 300 MG capsule Take 1 capsule (300 mg total) by mouth 2 (two) times daily. 60 capsule 2   meloxicam (MOBIC) 7.5 MG tablet Take 1-2 tablets (7.5-15 mg total) by mouth daily as needed for pain. 30 tablet 2   metoprolol succinate (TOPROL-XL) 100 MG 24 hr tablet Take 1 tablet (100 mg total) by mouth daily. Take with or immediately following a meal. 90 tablet 1   omeprazole (PRILOSEC) 40 MG capsule Take 1 capsule (40 mg total) by mouth daily as needed. 90 capsule 1    valACYclovir (VALTREX) 500 MG tablet Take 1 tablet (500 mg total) by mouth daily. 90 tablet 1   losartan (COZAAR) 100 MG tablet Take 1 tablet (100 mg total) by mouth daily. 90 tablet 1   No facility-administered medications prior to visit.    No Known Allergies  ROS     See HPI   Physical Exam Constitutional:      General: She is not in acute distress.    Appearance: Normal appearance. She is well-developed.  HENT:     Head: Normocephalic and atraumatic.     Right Ear: External ear normal.     Left Ear: External ear normal.  Eyes:     General: No scleral icterus. Neck:     Thyroid: No thyromegaly.  Cardiovascular:     Rate and Rhythm: Normal rate and regular rhythm.     Heart sounds: Normal heart sounds. No murmur heard. Pulmonary:     Effort: Pulmonary effort is normal. No respiratory distress.     Breath sounds: Normal breath sounds. No wheezing.  Musculoskeletal:     Cervical back: Neck supple.  Skin:    General: Skin is warm and dry.  Neurological:     Mental Status: She is alert and oriented to person, place, and time.  Psychiatric:        Mood and Affect: Mood normal.        Behavior: Behavior normal.        Thought Content: Thought content normal.        Judgment: Judgment normal.     BP (!) 138/95   Pulse (!) 57   Temp 98.2 F (36.8 C) (Oral)   Resp 16   Wt 238 lb (108 kg)   LMP 04/02/2018   SpO2 100%   BMI 39.61 kg/m  Wt Readings from Last 3 Encounters:  11/28/22 238 lb (108 kg)  11/07/22 237 lb (107.5 kg)  09/26/22 234 lb (106.1 kg)       Assessment & Plan:   Problem List Items Addressed This Visit       Unprioritized   RESOLVED: Strep pharyngitis    Resolved.       HTN (hypertension) - Primary    BP remains above goal. Will change losartan 100 to losartan-hctz 100/'25mg'$ . Follow up in 2 weeks. Recheck bmet today.       Relevant Medications   losartan-hydrochlorothiazide (HYZAAR)  100-25 MG tablet   Other Relevant Orders   Basic  Metabolic Panel (BMET)    I have discontinued [name]'s losartan. I am also having her start on losartan-hydrochlorothiazide. Additionally, I am having her maintain her BIOTIN PO, gabapentin, meloxicam, metoprolol succinate, valACYclovir, and omeprazole.  Meds ordered this encounter  Medications   losartan-hydrochlorothiazide (HYZAAR) 100-25 MG tablet    Sig: Take 1 tablet by mouth daily.    Dispense:  90 tablet    Refill:  0    Order Specific Question:   Supervising Provider    Answer:   Penni Homans A [[id number]]

## 2022-11-28 NOTE — Assessment & Plan Note (Signed)
Resolved

## 2022-11-28 NOTE — Assessment & Plan Note (Signed)
BP remains above goal. Will change losartan 100 to losartan-hctz 100/'25mg'$ . Follow up in 2 weeks. Recheck bmet today.

## 2022-11-29 LAB — BASIC METABOLIC PANEL
BUN: 15 mg/dL (ref 7–25)
CO2: 25 mmol/L (ref 20–32)
Calcium: 9.4 mg/dL (ref 8.6–10.4)
Chloride: 106 mmol/L (ref 98–110)
Creat: 0.88 mg/dL (ref 0.50–1.03)
Glucose, Bld: 72 mg/dL (ref 65–99)
Potassium: 3.8 mmol/L (ref 3.5–5.3)
Sodium: 142 mmol/L (ref 135–146)

## 2022-12-08 ENCOUNTER — Ambulatory Visit (INDEPENDENT_AMBULATORY_CARE_PROVIDER_SITE_OTHER): Payer: Commercial Managed Care - PPO | Admitting: Orthopedic Surgery

## 2022-12-08 ENCOUNTER — Other Ambulatory Visit (HOSPITAL_COMMUNITY): Payer: Self-pay

## 2022-12-08 ENCOUNTER — Ambulatory Visit (INDEPENDENT_AMBULATORY_CARE_PROVIDER_SITE_OTHER): Payer: Commercial Managed Care - PPO

## 2022-12-08 ENCOUNTER — Encounter: Payer: Self-pay | Admitting: Orthopedic Surgery

## 2022-12-08 DIAGNOSIS — M545 Low back pain, unspecified: Secondary | ICD-10-CM

## 2022-12-08 MED ORDER — METHOCARBAMOL 500 MG PO TABS
500.0000 mg | ORAL_TABLET | Freq: Four times a day (QID) | ORAL | 0 refills | Status: DC
  Filled 2022-12-08: qty 50, 13d supply, fill #0

## 2022-12-08 NOTE — Progress Notes (Signed)
Orthopedic Spine Surgery Office Note  Assessment: Patient is a 52 y.o. female with history of L2/3 microdiscectomy who comes in with low back pain mostly around the left paraspinal muscles and left SI joint. Radiates along the left lateral hip which points towards back as etiology but physical exam suggest SI joint   Plan: -Explained that initially conservative treatment is tried as a significant number of patients may experience relief with these treatment modalities. Discussed that the conservative treatments include:  -activity modification  -physical therapy  -over the counter pain medications  -medrol dosepak  -steroid injections -Patient has tried advil activity modification -Prescribed robaxin, recommended voltaren gel over the back, core strengthening (exercises provided)  -If above does not help in the next couple of weeks, would try a left SI joint injection with Dr. Rolena Infante -Patient should return to office on as needed basis   Patient expressed understanding of the plan and all questions were answered to the patient's satisfaction.   ___________________________________________________________________________   History:  Patient is a 52 y.o. female with history of L2/3 microdiscectomy who presents today for low back pain. Within the last week, patient developed acute onset of the low back. She was not doing any activities out of the ordinary. No heavy lifting. Pain came on at the end of the day. Pain has been around the left lower back paraspinal muscle and the SI joint. Sometimes, radiates to the lateral hip on the left side. No right sided symptoms. Denies paresthesias and numbness. Does not radiate past the hip on the left side.    Weakness: denies  Symptoms of imbalance: denies Paresthesias and numbness: denies Bowel or bladder incontinence: denies Saddle anesthesia: denies  Treatments tried: advil, activity modification  Review of systems: Denies fevers and chills,  night sweats, unexplained weight loss, history of cancer, pain that wakes her at night  Past medical history: GERD HTN OSA  Allergies: NKDA  Past surgical history:  L2/3 microdiscectomy Bilateral bunionectomy Cholecystectomy Left wrist cyst excision  Social history: Denies use of nicotine product (smoking, vaping, patches, smokeless) Alcohol use: rare Denies recreational drug use  Physical Exam:  General: no acute distress, appears stated age Neurologic: alert, answering questions appropriately, following commands Respiratory: unlabored breathing on room air, symmetric chest rise Psychiatric: appropriate affect, normal cadence to speech   MSK (spine):  -Strength exam      Left  Right EHL    5/5  5/5 TA    5/5  5/5 GSC    5/5  5/5 Knee extension  5/5  5/5 Hip flexion   5/5  5/5  -Sensory exam    Sensation intact to light touch in L3-S1 nerve distributions of bilateral lower extremities  -Achilles DTR: 2/4 on the left, 2/4 on the right -Patellar tendon DTR: 2/4 on the left, 2/4 on the right  -Straight leg raise: negative -Contralateral straight leg raise: negative  -Clonus: no beats bilaterally  -TTP over the left lumbar paraspinal muscles, no tenderness to palpation over the remainder of the back  -Left hip exam: no pain through range of motion, negative stinchfield, TTP over the SI joint, no TTP over the remainder of the hip, positive FABER, negative FADIR, weakly positive gaenslens -Right hip exam: no pain through range of motion, negative FADIR, negative FABER, negative stinchfield  Imaging: XR of the lumbar spine from 12/08/2022 was independently reviewed and interpreted, showing anterolisthesis at L4/5. No significant disc height loss. No fracture or dislocation.    Patient name: Denise Austin  Patient MRN: [id number] Date of visit: 12/08/22

## 2022-12-12 ENCOUNTER — Ambulatory Visit: Payer: Commercial Managed Care - PPO | Admitting: Family

## 2022-12-15 ENCOUNTER — Ambulatory Visit: Payer: Commercial Managed Care - PPO | Admitting: Family

## 2022-12-15 ENCOUNTER — Other Ambulatory Visit (HOSPITAL_BASED_OUTPATIENT_CLINIC_OR_DEPARTMENT_OTHER): Payer: Self-pay

## 2022-12-15 VITALS — BP 134/83 | HR 66 | Temp 98.4°F | Resp 16 | Wt 234.0 lb

## 2022-12-15 DIAGNOSIS — I10 Essential (primary) hypertension: Secondary | ICD-10-CM

## 2022-12-15 MED ORDER — LOSARTAN POTASSIUM 50 MG PO TABS
50.0000 mg | ORAL_TABLET | Freq: Every day | ORAL | 1 refills | Status: DC
  Filled 2022-12-15 – 2023-01-12 (×2): qty 90, 90d supply, fill #0
  Filled 2023-05-06: qty 90, 90d supply, fill #1

## 2022-12-15 NOTE — Progress Notes (Signed)
Subjective:   By signing my name below, I, Jamey Reas, attest that this documentation has been prepared under the direction and in the presence of Debbrah Alar, NP. 12/15/2022   Patient ID: Denise Austin, female    DOB: 09/10/1971, 52 y.o.   MRN: [id number]  Chief Complaint  Patient presents with   Hypertension    Here for follow up    HPI Patient is in today for an office visit.   Blood pressure: She was put on 25 mg Hydrochlorothiazide daily PO during last visit but she stopped after a week due to experiencing headaches. She resumed her 50 mg losartan daily PO and started eating healthier and found her headache resolved. She also notes her blood pressure measured within normal range since she switched. She is requesting a refill on 50 mg Losartan as well. She continues taking 100 mg metoprolol succinate daily PO and reports no new issues while taking it.  BP Readings from Last 3 Encounters:  12/15/22 134/83  11/28/22 (!) 138/95  11/07/22 (!) 140/88   Pulse Readings from Last 3 Encounters:  12/15/22 66  11/28/22 (!) 57  11/07/22 (!) 58     Past Medical History:  Diagnosis Date   Allergy    GERD (gastroesophageal reflux disease)    Hyperglycemia    Hypertension    OSA (obstructive sleep apnea) 04/15/2018   PONV (postoperative nausea and vomiting)    Sleep apnea    wears cpap    Uterine fibroid     Past Surgical History:  Procedure Laterality Date   BUNIONECTOMY     Lt-01/07/06, Rt-09/15/07   CHOLECYSTECTOMY     CYST REMOVAL HAND     Left Wrist   LUMBAR LAMINECTOMY/DECOMPRESSION MICRODISCECTOMY Right 04/22/2017   Procedure: Extraforaminal Microdiscectomy - Lumbar two-Lumbar three - right;  Surgeon: Eustace Moore, MD;  Location: [place];  Service: Neurosurgery;  Laterality: Right;   lumbar surgery revision  04/24/2017   WOUND EXPLORATION N/A 05/22/2017   Procedure: Revision Of Lumbar Wound;  Surgeon: Eustace Moore, MD;  Location: [place];  Service:  Neurosurgery;  Laterality: N/A;  Lumbar wound revision    Family History  Problem Relation Age of Onset   Hypertension Mother    Diabetes Mother    Sleep apnea Mother    Obesity Mother    Stroke Father    Hypertension Father        smoker   COPD Father    Single kidney Sister        removed as a child   Heart attack Neg Hx    Hyperlipidemia Neg Hx    Sudden death Neg Hx    Colon cancer Neg Hx    Colon polyps Neg Hx    Esophageal cancer Neg Hx    Stomach cancer Neg Hx    Rectal cancer Neg Hx     Social History   Socioeconomic History   Marital status: Single    Spouse name: Not on file   Number of children: Not on file   Years of education: Not on file   Highest education level: Not on file  Occupational History   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 Gummy vitamin     gabapentin (NEURONTIN) 300 MG capsule Take 1 capsule (300 mg total) by  mouth 2 (two) times daily. 60 capsule 2   methocarbamol (ROBAXIN) 500 MG tablet Take 1 tablet (500 mg total) by mouth 4 (four) times daily. 50 tablet 0   metoprolol succinate (TOPROL-XL) 100 MG 24 hr tablet Take 1 tablet (100 mg total) by mouth daily. Take with or immediately following a meal. 90 tablet 1   omeprazole (PRILOSEC) 40 MG capsule Take 1 capsule (40 mg total) by mouth daily as needed. 90 capsule 1   valACYclovir (VALTREX) 500 MG tablet Take 1 tablet (500 mg total) by mouth daily. 90 tablet 1   losartan-hydrochlorothiazide (HYZAAR) 100-25 MG tablet Take 1 tablet by mouth daily. 90 tablet 0   meloxicam (MOBIC) 7.5 MG tablet Take 1-2 tablets (7.5-15 mg total) by mouth daily as needed for pain. 30 tablet 2   No facility-administered medications prior to visit.    No Known Allergies  ROS See HPI    Objective:    Physical Exam Constitutional:      General: She is not in acute distress.    Appearance: Normal appearance.  HENT:     Head: Normocephalic and atraumatic.     Right Ear: External ear normal.     Left Ear: External ear normal.  Eyes:     Extraocular Movements: Extraocular movements intact.     Pupils: Pupils are equal, round, and reactive to light.  Cardiovascular:     Rate and Rhythm: Normal rate and regular rhythm.     Heart sounds: No murmur heard.    No gallop.  Pulmonary:     Effort: Pulmonary effort is normal. No respiratory distress.     Breath sounds: Normal breath sounds. No wheezing or rales.  Lymphadenopathy:     Cervical: No cervical adenopathy.  Skin:    General: Skin is warm.  Neurological:     Mental Status: She is alert and oriented to person, place, and time.  Psychiatric:        Judgment: Judgment normal.     BP 134/83 (BP Location: Right Arm, Patient Position: Sitting, Cuff Size: Large)   Pulse 66   Temp 98.4 F (36.9 C) (Oral)   Resp 16   Wt 234 lb (106.1 kg)   LMP 04/02/2018   SpO2 98%   BMI 38.94 kg/m  Wt Readings from Last  3 Encounters:  12/15/22 234 lb (106.1 kg)  11/28/22 238 lb (108 kg)  11/07/22 237 lb (107.5 kg)  Assessment & Plan:  Primary hypertension Assessment & Plan: BP looks better.  Did not tolerate the losartan hctz due to HA and returned to losartan 38m along with her toprol xl 1044m  Reports home readings have been good at this regimen.  I advised pt OK to continue this regimen with close monitoring of home bp's. Discussed if BP starts to run >140/90, to increase losartan to 100 mg and let me know.    Other orders -     Losartan Potassium; Take 1 tablet (50 mg total) by mouth daily.  Dispense: 90 tablet; Refill: 1    I, MeNance PearNP, personally preformed the services described in this documentation.  All medical record entries made by the scribe were at my direction and in my presence.  I have reviewed the chart and discharge instructions (if applicable) and agree that the record reflects my personal performance and is accurate and complete. 12/15/2022   I,Lacretia Leighs a scribe for MeNance PearNP.,have documented all relevant documentation on the behalf of MeNance PearNP,as directed by  MeNance PearNP while in the presence of MeNance PearNP.   MeNance PearNP

## 2022-12-15 NOTE — Assessment & Plan Note (Signed)
BP looks better.  Did not tolerate the losartan hctz due to HA and returned to losartan 91m along with her toprol xl 1050m  Reports home readings have been good at this regimen.  I advised pt OK to continue this regimen with close monitoring of home bp's. Discussed if BP starts to run >140/90, to increase losartan to 100 mg and let me know.

## 2023-01-12 ENCOUNTER — Other Ambulatory Visit (HOSPITAL_BASED_OUTPATIENT_CLINIC_OR_DEPARTMENT_OTHER): Payer: Self-pay

## 2023-01-12 ENCOUNTER — Other Ambulatory Visit: Payer: Self-pay

## 2023-01-12 ENCOUNTER — Other Ambulatory Visit (INDEPENDENT_AMBULATORY_CARE_PROVIDER_SITE_OTHER): Payer: Self-pay | Admitting: Orthopedic Surgery

## 2023-01-12 ENCOUNTER — Other Ambulatory Visit: Payer: Self-pay | Admitting: Orthopaedic Surgery

## 2023-01-12 MED ORDER — GABAPENTIN 300 MG PO CAPS
300.0000 mg | ORAL_CAPSULE | Freq: Two times a day (BID) | ORAL | 2 refills | Status: DC
  Filled 2023-01-12: qty 60, 30d supply, fill #0
  Filled 2023-02-13 – 2023-04-08 (×3): qty 60, 30d supply, fill #1
  Filled 2023-04-08: qty 60, 30d supply, fill #0
  Filled 2023-08-06: qty 60, 30d supply, fill #1

## 2023-01-13 ENCOUNTER — Other Ambulatory Visit (HOSPITAL_COMMUNITY): Payer: Self-pay

## 2023-01-13 ENCOUNTER — Other Ambulatory Visit (HOSPITAL_BASED_OUTPATIENT_CLINIC_OR_DEPARTMENT_OTHER): Payer: Self-pay

## 2023-01-14 ENCOUNTER — Ambulatory Visit (HOSPITAL_BASED_OUTPATIENT_CLINIC_OR_DEPARTMENT_OTHER): Payer: Commercial Managed Care - PPO | Admitting: Student

## 2023-01-15 ENCOUNTER — Ambulatory Visit (HOSPITAL_BASED_OUTPATIENT_CLINIC_OR_DEPARTMENT_OTHER): Payer: Commercial Managed Care - PPO | Admitting: Student

## 2023-02-13 ENCOUNTER — Other Ambulatory Visit (HOSPITAL_BASED_OUTPATIENT_CLINIC_OR_DEPARTMENT_OTHER): Payer: Self-pay

## 2023-02-13 ENCOUNTER — Other Ambulatory Visit: Payer: Self-pay | Admitting: Sports Medicine

## 2023-02-13 ENCOUNTER — Other Ambulatory Visit (HOSPITAL_COMMUNITY): Payer: Self-pay

## 2023-02-13 MED ORDER — POLYMYXIN B-TRIMETHOPRIM 10000-0.1 UNIT/ML-% OP SOLN
1.0000 [drp] | Freq: Four times a day (QID) | OPHTHALMIC | 0 refills | Status: DC
  Filled 2023-02-13: qty 10, 7d supply, fill #0

## 2023-02-13 NOTE — Progress Notes (Signed)
Evaluated Laiyla.  Has evidence of acute conjunctivitis.  Tearing, sensation of dry eye.  Has had some mild crusting, but no significant purulence.  No loss of vision. Will send in Polytrim eye drops to apply to affected eye QID.   Madelyn Brunner, DO Primary Care Sports Medicine Physician  [facility]  This note was dictated using Dragon naturally speaking software and may contain errors in syntax, spelling, or content which have not been identified prior to signing this note.

## 2023-02-20 ENCOUNTER — Other Ambulatory Visit (HOSPITAL_BASED_OUTPATIENT_CLINIC_OR_DEPARTMENT_OTHER): Payer: Self-pay

## 2023-02-20 ENCOUNTER — Other Ambulatory Visit (HOSPITAL_COMMUNITY): Payer: Self-pay

## 2023-03-03 ENCOUNTER — Other Ambulatory Visit (HOSPITAL_COMMUNITY): Payer: Self-pay

## 2023-03-03 ENCOUNTER — Other Ambulatory Visit: Payer: Self-pay | Admitting: Orthopaedic Surgery

## 2023-03-04 ENCOUNTER — Other Ambulatory Visit (HOSPITAL_COMMUNITY): Payer: Self-pay

## 2023-03-04 ENCOUNTER — Telehealth: Payer: Self-pay

## 2023-03-04 ENCOUNTER — Other Ambulatory Visit: Payer: Self-pay | Admitting: Physician Assistant

## 2023-03-04 MED ORDER — MELOXICAM 7.5 MG PO TABS
7.5000 mg | ORAL_TABLET | Freq: Two times a day (BID) | ORAL | 2 refills | Status: DC | PRN
  Filled 2023-03-04: qty 60, 30d supply, fill #0
  Filled 2023-04-08 (×2): qty 60, 30d supply, fill #1
  Filled 2023-09-28: qty 60, 30d supply, fill #2

## 2023-03-04 NOTE — Telephone Encounter (Signed)
Sent in

## 2023-03-04 NOTE — Telephone Encounter (Signed)
Patient needs Meloxicam refil send to [facility] on [facility] [address] [facility].

## 2023-03-10 ENCOUNTER — Other Ambulatory Visit (HOSPITAL_COMMUNITY): Payer: Self-pay

## 2023-03-10 ENCOUNTER — Other Ambulatory Visit: Payer: Self-pay

## 2023-03-24 ENCOUNTER — Other Ambulatory Visit (HOSPITAL_BASED_OUTPATIENT_CLINIC_OR_DEPARTMENT_OTHER): Payer: Self-pay

## 2023-03-26 ENCOUNTER — Other Ambulatory Visit (HOSPITAL_COMMUNITY): Payer: Self-pay

## 2023-03-26 ENCOUNTER — Other Ambulatory Visit (HOSPITAL_BASED_OUTPATIENT_CLINIC_OR_DEPARTMENT_OTHER): Payer: Self-pay

## 2023-04-07 ENCOUNTER — Other Ambulatory Visit: Payer: Self-pay

## 2023-04-07 ENCOUNTER — Encounter: Payer: Self-pay | Admitting: Sports Medicine

## 2023-04-07 ENCOUNTER — Other Ambulatory Visit (INDEPENDENT_AMBULATORY_CARE_PROVIDER_SITE_OTHER): Payer: Commercial Managed Care - PPO

## 2023-04-07 ENCOUNTER — Ambulatory Visit: Payer: Commercial Managed Care - PPO | Admitting: Sports Medicine

## 2023-04-07 DIAGNOSIS — M25851 Other specified joint disorders, right hip: Secondary | ICD-10-CM | POA: Diagnosis not present

## 2023-04-07 DIAGNOSIS — M25551 Pain in right hip: Secondary | ICD-10-CM | POA: Diagnosis not present

## 2023-04-07 MED ORDER — LIDOCAINE HCL 1 % IJ SOLN
4.0000 mL | INTRAMUSCULAR | Status: AC | PRN
Start: 2023-04-07 — End: 2023-04-07
  Administered 2023-04-07: 4 mL

## 2023-04-07 MED ORDER — METHYLPREDNISOLONE ACETATE 40 MG/ML IJ SUSP
80.0000 mg | INTRAMUSCULAR | Status: AC | PRN
  Administered 2023-04-07: 80 mg via INTRA_ARTICULAR

## 2023-04-07 NOTE — Progress Notes (Signed)
Denise Austin - 52 y.o. female MRN [id number]  Date of birth: 10-05-1971  Office Visit Note: Visit Date: 04/07/2023 PCP: Sandford Craze, NP Referred by: Sandford Craze, NP  Subjective: Chief Complaint  Patient presents with   Lower Back - Pain   Right Hip - Pain   HPI: Denise Austin is a pleasant 52 y.o. female who presents today for right hip/back pain. She is one of our best employees here at [employer].  She does have a history of L4-L5 microdiscectomy performed by Dr. Yetta Barre years ago. Saw Dr. Christell Constant back in February of 2024 was having low back pain mostly around the left paraspinal muscles and left SI joint. Thought possibly coming from left SI-joint at that time.  She tells me today that more of her pain is over the anterior hip and radiating into the groin.  She does still have some pain over the posterior aspect of the hip/back but more the pain is in the groin.  Really flared up last Thursday and then again yesterday.  Having difficulty walking because of the pain.  Pertinent ROS were reviewed with the patient and found to be negative unless otherwise specified above in HPI.   Assessment & Plan: Visit Diagnoses:  1. Right hip impingement syndrome   2. Pain in right hip    Plan: Discussed with Denise Austin the nature of her right hip/posterior back pain.  Her exam today really suggest that her pain is emanating from the right hip.  She has very mild arthritic change but relatively good joint space, she does have x-ray evidence of a spur likely contributing to her hip impingement.  She has had prior lumbar microdiscectomy and some SI joint pathology, although I think this is a lesser degree of her pain today.  Discussed all treatment options for the hip, through shared decision making elected to proceed with ultrasound-guided intra-articular hip injection.  She had rather excellent relief of her pain manage following the procedure from the anesthetic portion only.   I would like her to resume meloxicam 7.5 mg twice daily x 1 week, once daily for an additional week and then discontinue.  She may use ice as well for any postinjection pain.  Would like her to have about 48 hours of modified activity and then starting Thursday evening may return to activity as tolerated.  Follow-up: Return if symptoms worsen or fail to improve.   Meds & Orders: No orders of the defined types were placed in this encounter.   Orders Placed This Encounter  Procedures   Large Joint Inj   XR HIP UNILAT W OR W/O PELVIS 2-3 VIEWS RIGHT   US Guided Needle Placement - No Linked Charges     Procedures: Large Joint Inj: R hip joint on 04/07/2023 11:41 AM Indications: pain Details: 22 G 3.5 in needle, ultrasound-guided anterior approach Medications: 4 mL lidocaine 1 %; 80 mg methylPREDNISolone acetate 40 MG/ML Outcome: tolerated well, no immediate complications  Procedure: US-guided intra-articular hip injection, Right After discussion on risks/benefits/indications and informed verbal consent was obtained, a timeout was performed. Patient was lying supine on exam table. The hip was cleaned with betadine and alcohol swabs. Then utilizing ultrasound guidance, the patient's femoral head and neck junction was identified and subsequently injected with 4:2 lidocaine:depomedrol via an in-plane approach with ultrasound visualization of the injectate administered into the hip joint. Patient tolerated procedure well without immediate complications.  Procedure, treatment alternatives, risks and benefits explained, specific risks discussed. Consent was given  by the patient. Immediately prior to procedure a time out was called to verify the correct patient, procedure, equipment, support staff and site/side marked as required. Patient was prepped and draped in the usual sterile fashion.          Clinical History: No specialty comments available.  She reports that she has never smoked. She has  never used smokeless tobacco.  Recent Labs    09/26/22 0728  HGBA1C 5.8    Objective:   Vital Signs: LMP 04/02/2018   Physical Exam  Gen: Well-appearing, in no acute distress; non-toxic CV:  Well-perfused. Warm.  Resp: Breathing unlabored on room air; no wheezing. Psych: Fluid speech in conversation; appropriate affect; normal thought process Neuro: Sensation intact throughout. No gross coordination deficits.   Ortho Exam - Right hip/Low back: + Mild TTP over the right SI joint, no midline spinous process TTP of the low back.  The right hip is irritable to range of motion.  There is pain at endrange internal and external rotation with logroll without significant bony block.  Positive FADIR, positive Stinchfield test.  No pain over the greater trochanter.  There is no weakness with abduction bilaterally, full strength about the hip.  Imaging: XR HIP UNILAT W OR W/O PELVIS 2-3 VIEWS RIGHT  Result Date: 04/07/2023 2 views of the right hip including AP and lateral femoral ordered and reviewed by myself.  X-rays demonstrate relatively well-preserved joint space, there is some mild OA with narrowing on the medial aspect of the femoral acetabular joint.  There is bony spurring of the overlying acetabular rim, suggestive of hip impingement.  No acute fracture or other bony abnormality noted.  Mild SI joint sclerosis bilaterally.   Past Medical/Family/Surgical/Social History: Medications & Allergies reviewed per EMR, new medications updated. Patient Active Problem List   Diagnosis Date Noted   Symptomatic mammary hypertrophy 12/02/2021   Back pain 12/02/2021   Neck pain 12/02/2021   Morbid obesity due to excess calories (HCC) 04/21/2019   OSA (obstructive sleep apnea) 04/15/2018   Herpes simplex viral infection 07/10/2017   S/P lumbar laminectomy 04/22/2017   GERD (gastroesophageal reflux disease) 07/25/2016   Prediabetes 07/25/2016   HTN (hypertension) 12/24/2015   Preventative health  care 10/16/2014   CMC arthritis 07/06/2014   S/P excision of ganglion cyst 07/06/2014   Synovitis 07/06/2014   Past Medical History:  Diagnosis Date   Allergy    GERD (gastroesophageal reflux disease)    Hyperglycemia    Hypertension    OSA (obstructive sleep apnea) 04/15/2018   PONV (postoperative nausea and vomiting)    Sleep apnea    wears cpap    Uterine fibroid    Family History  Problem Relation Age of Onset   Hypertension Mother    Diabetes Mother    Sleep apnea Mother    Obesity Mother    Stroke Father    Hypertension Father        smoker   COPD Father    Single kidney Sister        removed as a child   Heart attack Neg Hx    Hyperlipidemia Neg Hx    Sudden death Neg Hx    Colon cancer Neg Hx    Colon polyps Neg Hx    Esophageal cancer Neg Hx    Stomach cancer Neg Hx    Rectal cancer Neg Hx    Past Surgical History:  Procedure Laterality Date   BUNIONECTOMY     Lt-01/07/06, Rt-09/15/07  CHOLECYSTECTOMY     CYST REMOVAL HAND     Left Wrist   LUMBAR LAMINECTOMY/DECOMPRESSION MICRODISCECTOMY Right 04/22/2017   Procedure: Extraforaminal Microdiscectomy - Lumbar two-Lumbar three - right;  Surgeon: Tia Alert, MD;  Location: [facility] OR;  Service: Neurosurgery;  Laterality: Right;   lumbar surgery revision  04/24/2017   WOUND EXPLORATION N/A 05/22/2017   Procedure: Revision Of Lumbar Wound;  Surgeon: Tia Alert, MD;  Location: [facility] OR;  Service: Neurosurgery;  Laterality: N/A;  Lumbar wound revision   Social History   Occupational History   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

## 2023-04-08 ENCOUNTER — Other Ambulatory Visit (HOSPITAL_COMMUNITY): Payer: Self-pay

## 2023-04-09 ENCOUNTER — Other Ambulatory Visit (HOSPITAL_BASED_OUTPATIENT_CLINIC_OR_DEPARTMENT_OTHER): Payer: Self-pay

## 2023-05-05 ENCOUNTER — Ambulatory Visit: Payer: Commercial Managed Care - PPO | Admitting: Family

## 2023-05-05 ENCOUNTER — Other Ambulatory Visit (HOSPITAL_BASED_OUTPATIENT_CLINIC_OR_DEPARTMENT_OTHER): Payer: Self-pay

## 2023-05-05 ENCOUNTER — Encounter: Payer: Self-pay | Admitting: Family

## 2023-05-05 VITALS — BP 133/79 | HR 68 | Temp 98.2°F | Resp 16 | Ht 65.0 in | Wt 237.0 lb

## 2023-05-05 DIAGNOSIS — I10 Essential (primary) hypertension: Secondary | ICD-10-CM | POA: Diagnosis not present

## 2023-05-05 DIAGNOSIS — G43909 Migraine, unspecified, not intractable, without status migrainosus: Secondary | ICD-10-CM

## 2023-05-05 DIAGNOSIS — G4733 Obstructive sleep apnea (adult) (pediatric): Secondary | ICD-10-CM | POA: Diagnosis not present

## 2023-05-05 DIAGNOSIS — B009 Herpesviral infection, unspecified: Secondary | ICD-10-CM | POA: Diagnosis not present

## 2023-05-05 MED ORDER — SUMATRIPTAN SUCCINATE 50 MG PO TABS
50.00 mg | ORAL_TABLET | ORAL | 2 refills | Status: AC | PRN
Start: 2023-05-05 — End: ?
  Filled 2023-05-05 (×2): qty 10, 30d supply, fill #0

## 2023-05-05 MED ORDER — OMEPRAZOLE 40 MG PO CPDR
40.0000 mg | DELAYED_RELEASE_CAPSULE | Freq: Every day | ORAL | 1 refills | Status: DC | PRN
  Filled 2023-05-05 – 2023-06-25 (×2): qty 90, 90d supply, fill #0
  Filled 2023-09-28: qty 90, 90d supply, fill #1

## 2023-05-05 MED ORDER — METOPROLOL SUCCINATE ER 100 MG PO TB24
100.0000 mg | ORAL_TABLET | Freq: Every day | ORAL | 1 refills | Status: DC
  Filled 2023-05-05 (×2): qty 90, 90d supply, fill #0
  Filled 2023-07-30: qty 90, 90d supply, fill #1

## 2023-05-05 MED ORDER — VALACYCLOVIR HCL 500 MG PO TABS
500.0000 mg | ORAL_TABLET | Freq: Every day | ORAL | 1 refills | Status: DC
  Filled 2023-05-05 (×2): qty 90, 90d supply, fill #0
  Filled 2023-09-28: qty 90, 90d supply, fill #1

## 2023-05-05 NOTE — Progress Notes (Unsigned)
Subjective:     Patient ID: Denise Austin, female    DOB: 10/18/1971, 52 y.o.   MRN: [id number]  Chief Complaint  Patient presents with   Follow-up    BP    HPI  Discussed the use of AI scribe software for clinical note transcription with the patient, who gave verbal consent to proceed.  History of Present Illness   The patient, with a history of hypertension, presents with a recent onset of unilateral headaches, which she describes as 'bad headaches.' The headaches were associated with ipsilateral eye twitching and light sensitivity, but no nausea. The patient self-discontinued her antihypertensive medications, losartan and metoprolol, suspecting they might be the cause of the headaches. Since discontinuation, the headaches have resolved.  The patient also reports a stable weight of around 230-234 lbs despite dietary changes and increased exercise. She expresses frustration at not being able to lose more weight.  The patient has a history of herpes simplex virus (HSV) infection, for which she takes daily Valtrex. She reports no recent outbreaks. She also has sleep apnea and uses a CPAP machine regularly.       Wt Readings from Last 3 Encounters:  05/05/23 237 lb (107.5 kg)  12/15/22 234 lb (106.1 kg)  11/28/22 238 lb (108 kg)   Lab Results  Component Value Date   HGBA1C 5.8 09/26/2022   HGBA1C 5.7 03/21/2022   HGBA1C 6.2 (H) 12/24/2021   Lab Results  Component Value Date   LDLCALC 72 12/24/2021   CREATININE 0.86 05/05/2023      Health Maintenance Due  Topic Date Due   COVID-19 Vaccine (1) Never done   Zoster Vaccines- Shingrix (2 of 2) 05/16/2022   PAP SMEAR-Modifier  06/26/2023    Past Medical History:  Diagnosis Date   Allergy    GERD (gastroesophageal reflux disease)    Hyperglycemia    Hypertension    OSA (obstructive sleep apnea) 04/15/2018   PONV (postoperative nausea and vomiting)    Sleep apnea    wears cpap    Uterine fibroid     Past  Surgical History:  Procedure Laterality Date   BUNIONECTOMY     Lt-01/07/06, Rt-09/15/07   CHOLECYSTECTOMY     CYST REMOVAL HAND     Left Wrist   LUMBAR LAMINECTOMY/DECOMPRESSION MICRODISCECTOMY Right 04/22/2017   Procedure: Extraforaminal Microdiscectomy - Lumbar two-Lumbar three - right;  Surgeon: Tia Alert, MD;  Location: [facility];  Service: Neurosurgery;  Laterality: Right;   lumbar surgery revision  04/24/2017   WOUND EXPLORATION N/A 05/22/2017   Procedure: Revision Of Lumbar Wound;  Surgeon: Tia Alert, MD;  Location: [facility] OR;  Service: Neurosurgery;  Laterality: N/A;  Lumbar wound revision    Family History  Problem Relation Age of Onset   Hypertension Mother    Diabetes Mother    Sleep apnea Mother    Obesity Mother    Stroke Father    Hypertension Father        smoker   COPD Father    Single kidney Sister        removed as a child   Heart attack Neg Hx    Hyperlipidemia Neg Hx    Sudden death Neg Hx    Colon cancer Neg Hx    Colon polyps Neg Hx    Esophageal cancer Neg Hx    Stomach cancer Neg Hx    Rectal cancer Neg Hx     Social History   Socioeconomic History  Marital status: Single    Spouse name: Not on file   Number of children: Not on file   Years of education: Not on file   Highest education level: Not on file  Occupational History   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  Other Topics Concern   Not on file  Social History Narrative   Has 2 children (2 boys)- one son in [place] and one is local   3 grandchildren 2 boys, 1 granddaughter   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 Gummy vitamin     gabapentin (NEURONTIN) 300 MG capsule Take 1 capsule (300 mg total) by mouth 2 (two) times daily. 60 capsule 2   losartan (COZAAR) 50 MG tablet Take 1 tablet (50 mg total) by mouth daily. 90 tablet 1   meloxicam (MOBIC) 7.5 MG tablet Take 1 tablet (7.5 mg total) by mouth 2 (two) times daily as needed for pain. 60 tablet 2   metoprolol succinate (TOPROL-XL) 100 MG 24 hr tablet Take 1 tablet (100 mg total) by mouth daily. Take with or immediately following a meal. 90 tablet 1   omeprazole (PRILOSEC) 40 MG capsule Take 1 capsule (40 mg total) by mouth daily as needed. 90 capsule 1   valACYclovir (VALTREX) 500 MG tablet Take 1 tablet (500 mg total) by mouth daily. 90 tablet 1   methocarbamol (ROBAXIN) 500 MG tablet Take 1 tablet (500 mg total) by mouth 4 (four) times daily. (Patient not taking: Reported on 05/05/2023) 50 tablet 0   trimethoprim-polymyxin b (POLYTRIM) ophthalmic solution Place 1 drop into the left eye every 6 (six) hours for 7 days. (Patient not taking: Reported on 05/05/2023) 10 mL 0   No facility-administered medications prior to visit.    No Known Allergies  ROS    See HPI Objective:    Physical Exam Constitutional:      General: She is not in acute distress.    Appearance: Normal appearance. She is well-developed.  HENT:     Head: Normocephalic and atraumatic.     Right Ear: External ear normal.     Left Ear: External ear normal.  Eyes:     General: No scleral icterus. Neck:     Thyroid: No  thyromegaly.  Cardiovascular:     Rate and Rhythm: Normal rate and regular rhythm.     Heart sounds: Normal heart sounds. No murmur heard. Pulmonary:     Effort: Pulmonary effort is normal. No respiratory distress.     Breath sounds: Normal breath sounds. No wheezing.  Musculoskeletal:     Cervical back: Neck supple.  Skin:    General: Skin is warm and dry.  Neurological:     General: No focal deficit present.     Mental Status: She is alert and oriented to person, place, and time.     Cranial Nerves: No cranial nerve deficit.  Psychiatric:        Mood and Affect: Mood normal.        Behavior: Behavior normal.        Thought Content: Thought content normal.        Judgment: Judgment normal.      BP 133/79   Pulse 68   Temp 98.2 F (36.8 C) (Oral)   Resp 16   Ht 5\' 5"  (1.651 m)   Wt 237 lb (107.5 kg)   LMP 04/02/2018   SpO2 98%   BMI 39.44 kg/m  Wt Readings from Last 3 Encounters:  05/05/23 237 lb (107.5 kg)  12/15/22 234 lb (106.1 kg)  11/28/22 238 lb (108 kg)       Assessment & Plan:   Problem List Items Addressed This Visit       Unprioritized   OSA (obstructive sleep apnea)    Patient uses CPAP and reports good compliance. -Advise to schedule follow-up with sleep medicine specialist.       Migraine without status migrainosus, not intractable - Primary    Unilateral headache with light sensitivity, suggestive of migraines. Headache resolved after discontinuing blood pressure medications, but these medications have been long-term and unlikely to be the cause. -Prescribe Imitrex for use at onset of migraines.       Relevant Medications   metoprolol succinate (TOPROL-XL) 100 MG 24 hr tablet   SUMAtriptan (IMITREX) 50 MG tablet   HTN (hypertension)    Patient self-discontinued Losartan 50mg  and Metoprolol due to headaches. Blood pressure stable off medications (133/79). Discussed potential for blood pressure to increase over time without medication. -Advise  to restart Losartan 50mg  and Metoprolol. -Recommend obtaining home blood pressure monitor to track readings.       Relevant Medications   metoprolol succinate (TOPROL-XL) 100 MG 24 hr tablet   Other Relevant Orders   Basic Metabolic Panel (BMET) (Completed)   Herpes simplex viral infection    : Patient on daily Valtrex with good control. -Continue Valtrex.       Relevant Medications   valACYclovir (VALTREX) 500 MG tablet   Preventive Care: Up to date on mammogram and colonoscopy. Pap smear due later in the summer. -Schedule physical with Pap smear for early September. -Order kidney function test today.    I have discontinued Keilynn L. Catano's methocarbamol and trimethoprim-polymyxin b. I am also having her start on SUMAtriptan. Additionally, I am having her maintain her BIOTIN PO, losartan, gabapentin, meloxicam, valACYclovir, omeprazole, and metoprolol succinate.  Meds ordered this encounter  Medications   valACYclovir (VALTREX) 500 MG tablet    Sig: Take 1 tablet (500 mg total) by mouth daily.    Dispense:  90 tablet    Refill:  1   omeprazole (PRILOSEC) 40 MG capsule    Sig: Take 1 capsule (40 mg total) by mouth daily as needed.    Dispense:  90 capsule    Refill:  1   metoprolol succinate (TOPROL-XL) 100 MG 24 hr tablet    Sig: Take 1 tablet (100 mg total) by mouth daily. Take with or immediately following a meal.    Dispense:  90 tablet  Refill:  1   SUMAtriptan (IMITREX) 50 MG tablet    Sig: Take 1 tablet (50 mg total) by mouth every 2 (two) hours as needed for migraine. May repeat in 2 hours if headache persists or recurs.    Dispense:  10 tablet    Refill:  2    Order Specific Question:   Supervising Provider    Answer:   Danise Edge A [id number]

## 2023-05-06 DIAGNOSIS — G43909 Migraine, unspecified, not intractable, without status migrainosus: Secondary | ICD-10-CM | POA: Insufficient documentation

## 2023-05-06 LAB — BASIC METABOLIC PANEL
BUN: 18 mg/dL (ref 6–23)
CO2: 27 mEq/L (ref 19–32)
Calcium: 9.5 mg/dL (ref 8.4–10.5)
Chloride: 102 mEq/L (ref 96–112)
Creatinine, Ser: 0.86 mg/dL (ref 0.40–1.20)
GFR: 77.5 mL/min (ref >60.00)
Glucose, Bld: 127 mg/dL — ABNORMAL HIGH (ref 70–99)
Potassium: 3.5 mEq/L (ref 3.5–5.1)
Sodium: 138 mEq/L (ref 135–145)

## 2023-05-06 NOTE — Assessment & Plan Note (Signed)
Patient self-discontinued Losartan 50mg  and Metoprolol due to headaches. Blood pressure stable off medications (133/79). Discussed potential for blood pressure to increase over time without medication. -Advise to restart Losartan 50mg  and Metoprolol. -Recommend obtaining home blood pressure monitor to track readings.

## 2023-05-06 NOTE — Assessment & Plan Note (Signed)
Unilateral headache with light sensitivity, suggestive of migraines. Headache resolved after discontinuing blood pressure medications, but these medications have been long-term and unlikely to be the cause. -Prescribe Imitrex for use at onset of migraines.

## 2023-05-06 NOTE — Patient Instructions (Signed)
VISIT SUMMARY:  During your visit, we discussed your hypertension, headaches, weight management, sleep apnea, and herpes simplex virus. You had stopped taking your blood pressure medications due to a new headache, but your blood pressure remained stable. We suspect your headache might be a migraine. You've been having difficulty losing weight despite changes in diet and exercise. You've been using your CPAP machine regularly for your sleep apnea and your herpes symptoms are well-controlled with Valtrex.  YOUR PLAN:  -HYPERTENSION: Hypertension is high blood pressure. We've decided to restart your blood pressure medications, Losartan and Metoprolol. You should also get a home blood pressure monitor to keep track of your blood pressure.  -HEADACHE: Your headache, which is one-sided and makes you sensitive to light, suggests you might be experiencing migraines. We're prescribing Imitrex, which you should take when you feel a migraine coming on.  -WEIGHT MANAGEMENT: Despite your efforts to eat healthier and exercise more, you're finding it hard to lose weight. We encourage you to continue with these changes.  -SLEEP APNEA: Sleep apnea is a condition where your breathing stops and starts while you sleep. You've been using your CPAP machine regularly, which is great. We recommend scheduling a follow-up appointment with your sleep specialist.  -HERPES SIMPLEX VIRUS: Herpes simplex virus is a common virus that causes sores on your mouth or genitals. Your symptoms are well-controlled with Valtrex, so you should continue taking it.  INSTRUCTIONS:  You should schedule a physical with a Pap smear for early September. We also checked your kidney function today. Your mammogram, flu shot, and colonoscopy are all up to date.

## 2023-05-06 NOTE — Assessment & Plan Note (Signed)
Patient uses CPAP and reports good compliance. -Advise to schedule follow-up with sleep medicine specialist.

## 2023-05-06 NOTE — Assessment & Plan Note (Signed)
:   Patient on daily Valtrex with good control. -Continue Valtrex.

## 2023-05-24 IMAGING — CR DG HIP (WITH OR WITHOUT PELVIS) 2-3V*L*
3 series · 3 of 3 positions shown · non-contrast
Comparison: None.

CLINICAL DATA: Fall, left knee and hip pain

EXAM:
DG HIP (WITH OR WITHOUT PELVIS) 2-3V LEFT

[t pelvis a.p.]
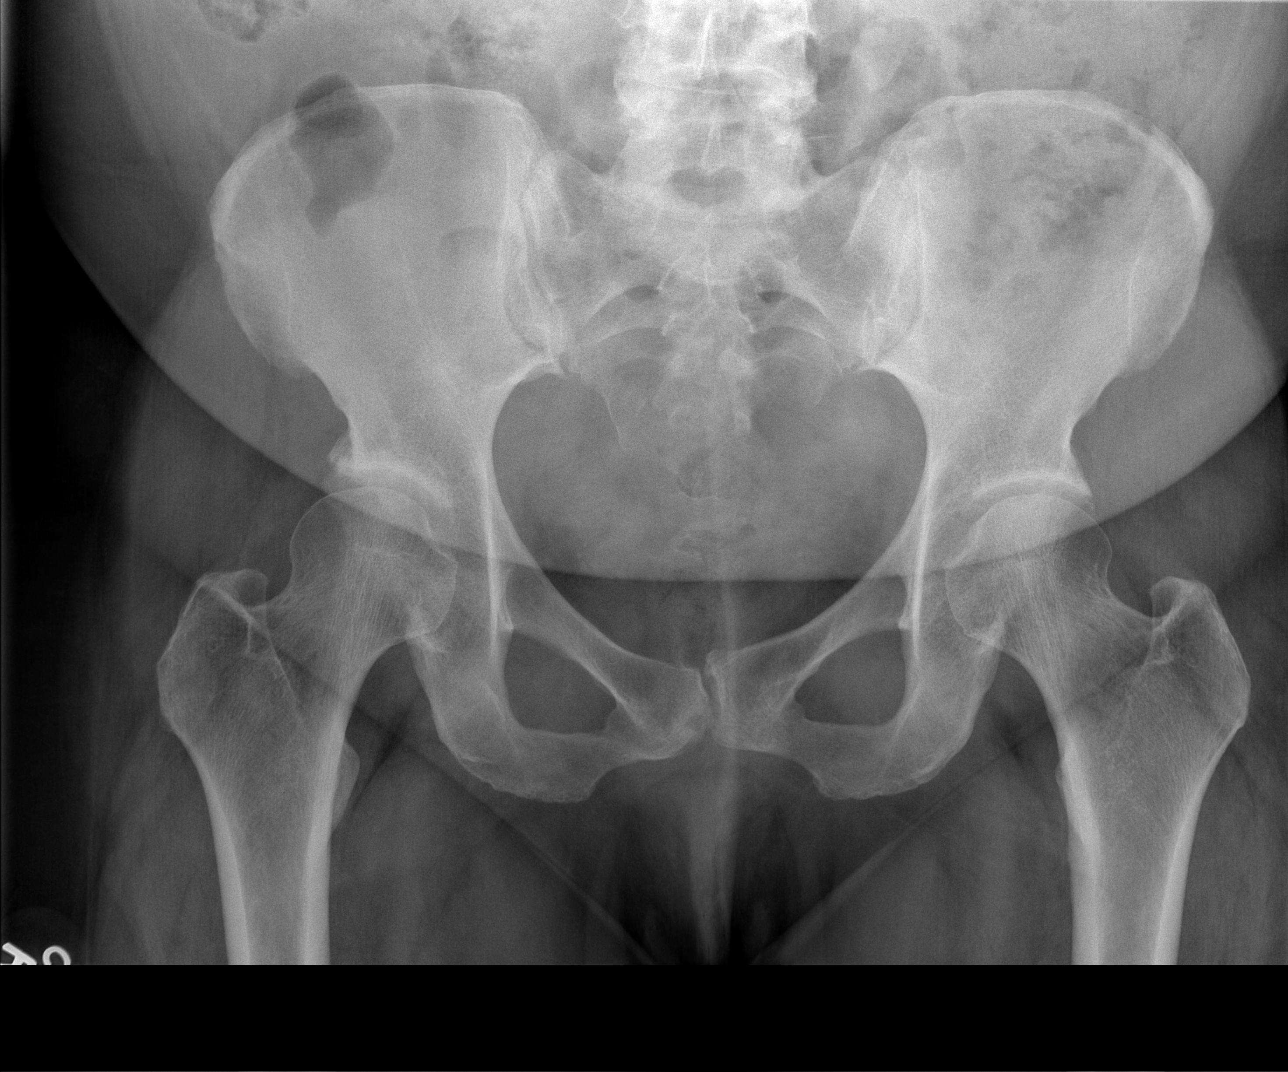

[t hip ap left]
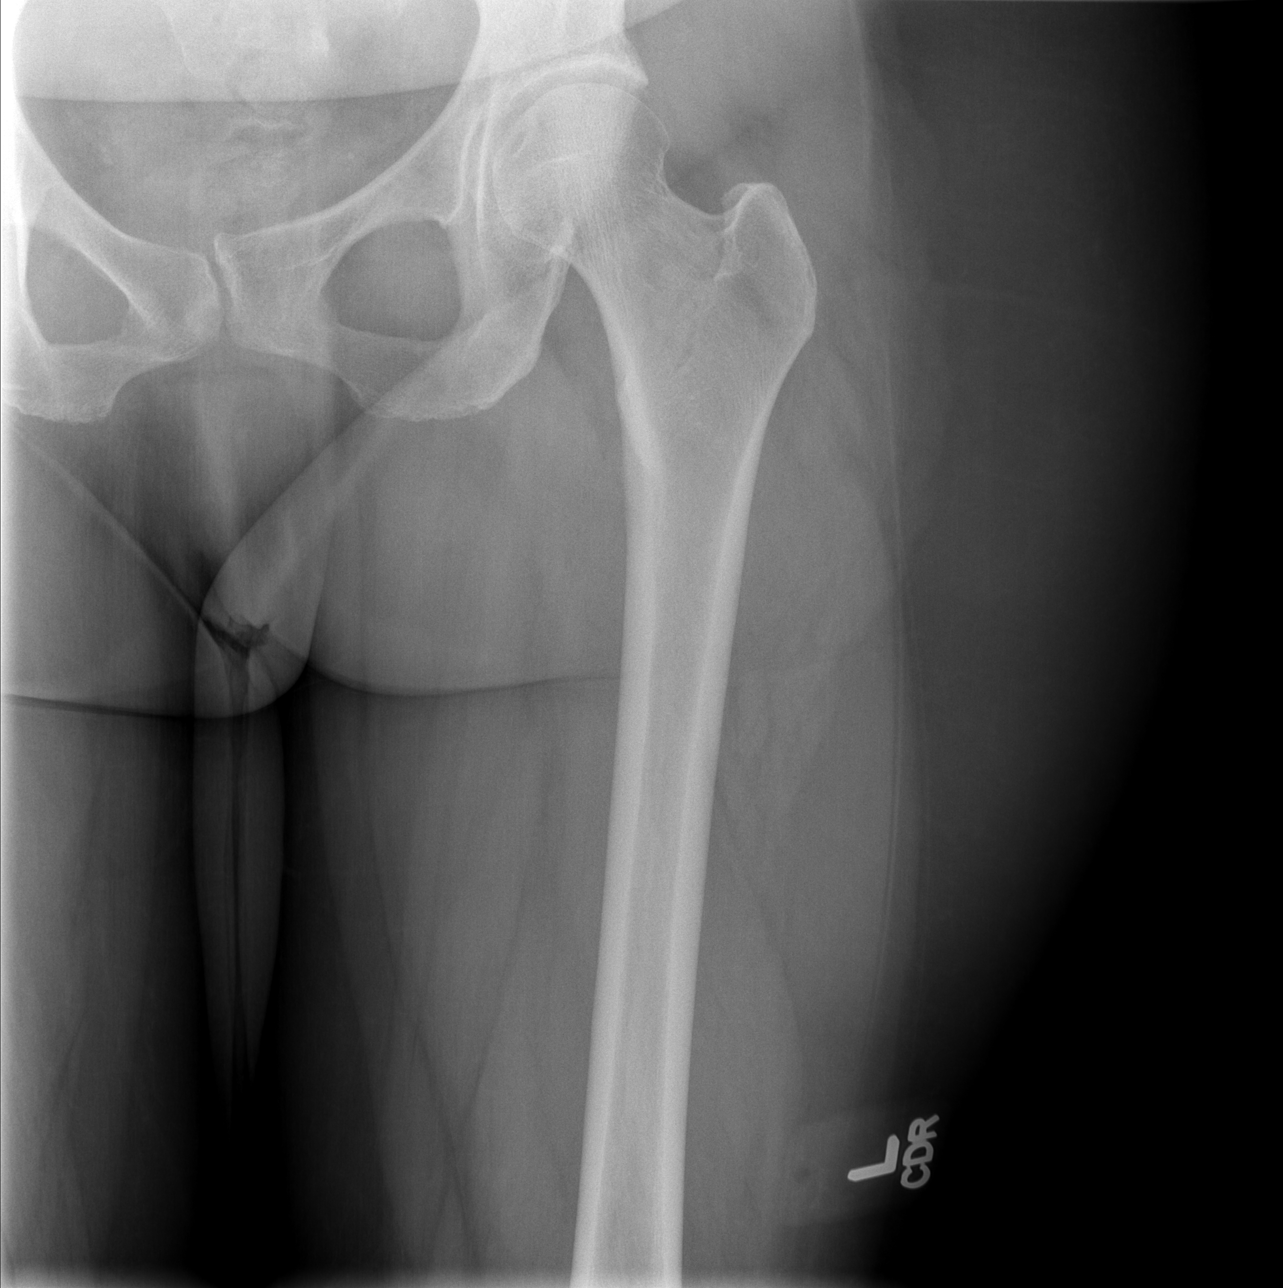

[t hip frog leg left]
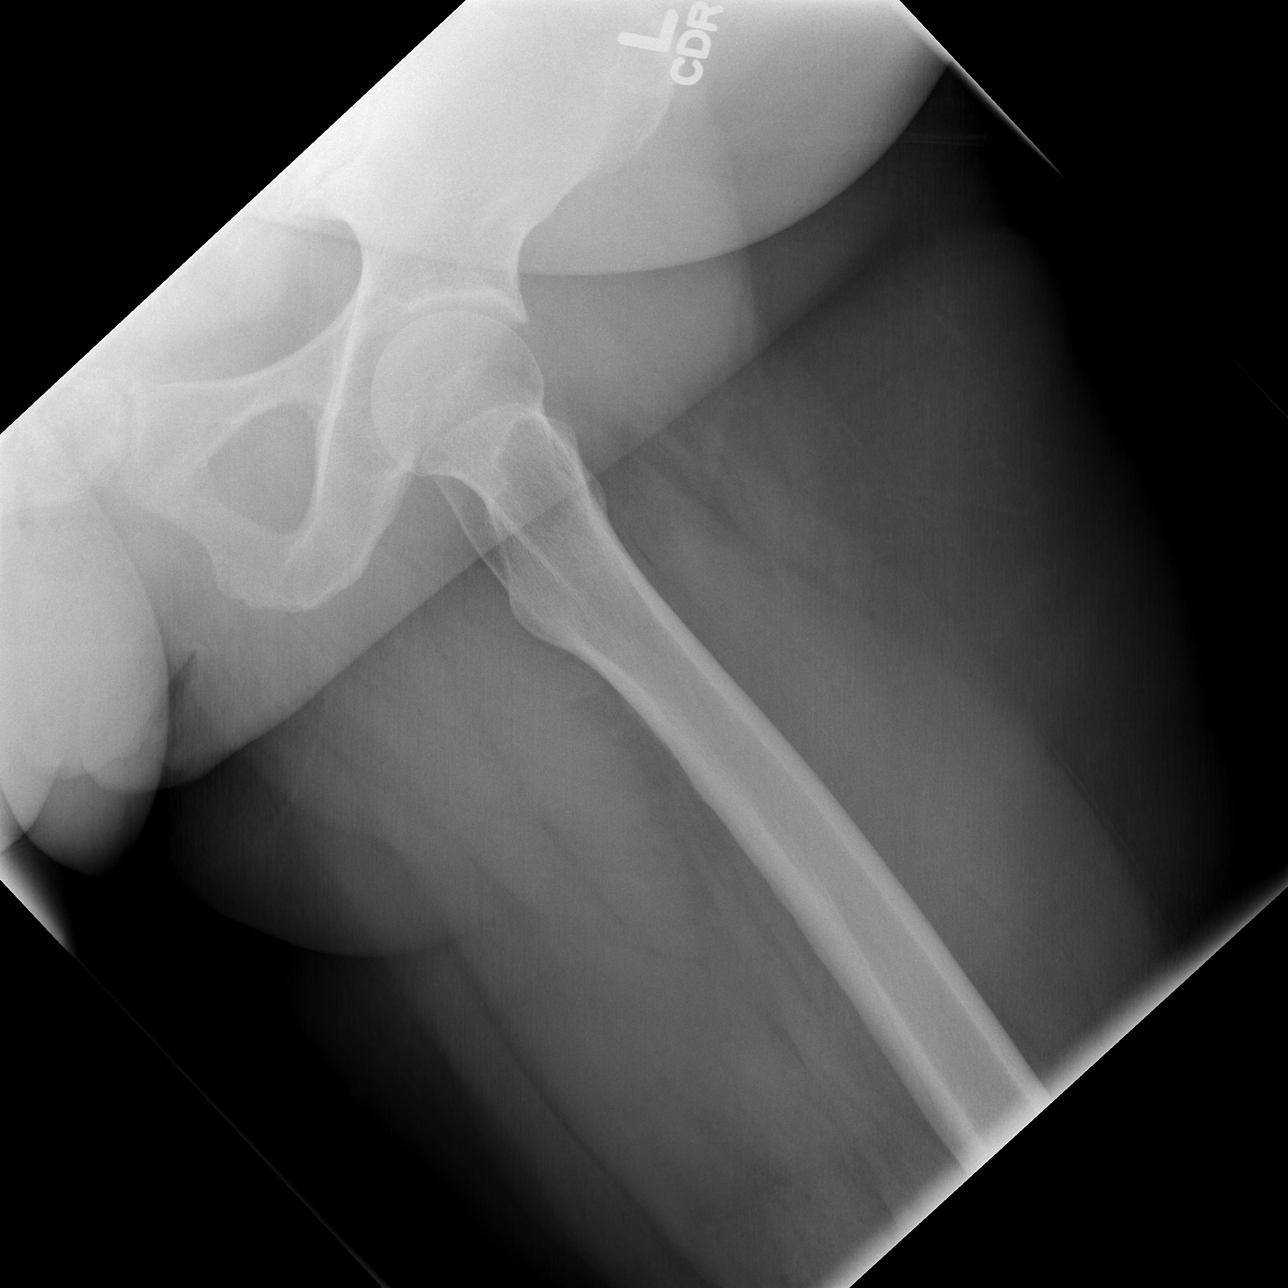

[3 of 3 positions shown; findings below may reference images not displayed]

FINDINGS: There is no evidence of hip fracture or dislocation. There is no
evidence of arthropathy or other focal bone abnormality.
IMPRESSION: Negative.

## 2023-06-11 ENCOUNTER — Encounter (INDEPENDENT_AMBULATORY_CARE_PROVIDER_SITE_OTHER): Payer: Self-pay

## 2023-06-25 ENCOUNTER — Other Ambulatory Visit (HOSPITAL_BASED_OUTPATIENT_CLINIC_OR_DEPARTMENT_OTHER): Payer: Self-pay

## 2023-07-06 ENCOUNTER — Encounter: Payer: Commercial Managed Care - PPO | Admitting: Family

## 2023-07-14 ENCOUNTER — Ambulatory Visit: Payer: Commercial Managed Care - PPO | Admitting: Family

## 2023-07-14 ENCOUNTER — Encounter: Payer: Self-pay | Admitting: Family

## 2023-07-30 ENCOUNTER — Other Ambulatory Visit (HOSPITAL_BASED_OUTPATIENT_CLINIC_OR_DEPARTMENT_OTHER): Payer: Self-pay

## 2023-07-30 ENCOUNTER — Other Ambulatory Visit: Payer: Self-pay

## 2023-07-30 ENCOUNTER — Other Ambulatory Visit: Payer: Self-pay | Admitting: Family

## 2023-07-30 MED ORDER — LOSARTAN POTASSIUM 50 MG PO TABS
50.0000 mg | ORAL_TABLET | Freq: Every day | ORAL | 1 refills | Status: DC
  Filled 2023-07-30: qty 90, 90d supply, fill #0
  Filled 2023-11-08: qty 90, 90d supply, fill #1

## 2023-08-06 ENCOUNTER — Other Ambulatory Visit (HOSPITAL_BASED_OUTPATIENT_CLINIC_OR_DEPARTMENT_OTHER): Payer: Self-pay

## 2023-08-17 ENCOUNTER — Telehealth: Payer: Self-pay

## 2023-08-17 NOTE — Telephone Encounter (Signed)
Patient called stating that she has been having severe pain in her right hip down to the top of her right thigh.  Has been taking meloxicam, but it is not helping.  CB# [phone].  Please advise.  Thank you.

## 2023-08-19 ENCOUNTER — Ambulatory Visit: Payer: Commercial Managed Care - PPO | Admitting: Sports Medicine

## 2023-08-26 ENCOUNTER — Ambulatory Visit: Payer: Commercial Managed Care - PPO | Admitting: Sports Medicine

## 2023-08-27 ENCOUNTER — Ambulatory Visit: Payer: Commercial Managed Care - PPO | Admitting: Sports Medicine

## 2023-08-27 ENCOUNTER — Encounter: Payer: Self-pay | Admitting: Sports Medicine

## 2023-08-27 ENCOUNTER — Ambulatory Visit: Payer: Self-pay

## 2023-08-27 DIAGNOSIS — G8929 Other chronic pain: Secondary | ICD-10-CM

## 2023-08-27 DIAGNOSIS — M5441 Lumbago with sciatica, right side: Secondary | ICD-10-CM

## 2023-08-27 DIAGNOSIS — M25551 Pain in right hip: Secondary | ICD-10-CM | POA: Diagnosis not present

## 2023-08-27 MED ORDER — BUPIVACAINE HCL 0.25 % IJ SOLN
2.0000 mL | INTRAMUSCULAR | Status: AC | PRN
Start: 2023-08-27 — End: 2023-08-27
  Administered 2023-08-27: 2 mL via INTRA_ARTICULAR

## 2023-08-27 MED ORDER — LIDOCAINE HCL 1 % IJ SOLN
2.0000 mL | INTRAMUSCULAR | Status: AC | PRN
Start: 2023-08-27 — End: 2023-08-27
  Administered 2023-08-27: 2 mL

## 2023-08-27 MED ORDER — BETAMETHASONE SOD PHOS & ACET 6 (3-3) MG/ML IJ SUSP
6.0000 mg | INTRAMUSCULAR | Status: AC | PRN
Start: 2023-08-27 — End: 2023-08-27
  Administered 2023-08-27: 6 mg via INTRA_ARTICULAR

## 2023-08-27 NOTE — Progress Notes (Signed)
Denise Austin - 52 y.o. female MRN [id number]  Date of birth: [birth year]  Office Visit Note: Visit Date: 08/27/2023 PCP: Sandford Craze, NP Referred by: Sandford Craze, NP  Subjective: Chief Complaint  Patient presents with   Right Hip - Pain    Injection   HPI: Denise Austin is a pleasant 52 y.o. female who presents today for right hip pain and low back pain with radiating right pain.  Rynesha has a history of acute on chronic right hip as well as back pain.  She does have a history of L2/L3 microdiscectomy with Dr. Yetta Barre in the past.  She did get good relief from intra-articular hip injection back in April with me.  The right hip is bothering her again although it is more so on the lateral side.  This is associated with radiating and numbness and tingling down the lateral hip to the level of the lateral knee.  Have some back pain associated as well.  She has been limping/gait change because of this.  Pertinent ROS were reviewed with the patient and found to be negative unless otherwise specified above in HPI.   Assessment & Plan: Visit Diagnoses:  1. Pain in right hip   2. Greater trochanteric pain syndrome of right lower extremity   3. Chronic bilateral low back pain with right-sided sciatica    Plan: Discussed with Cierra I think her back/hip and RLE pain is two-fold.  She does have pain directly over the greater trochanter and I do think she is having some greater trochanteric pain syndrome with associated weakness of the hip abductors.  For this, through shared decision making we did proceed with ultrasound-guided GT injection.  We also gave her some home exercises to work on hip stabilization and lateral abduction strengthening which she will perform once daily starting next week.  I do think she has a component of low back pain with right radiculopathy.  We will see over the next 7-10 days the degree of improvement from the hip injection alone.  If she is  still having radiating pain, I did suggest she should have this further evaluated by her back physician.  MRI of the low back could certainly be ascertained.  Follow-up: Return if symptoms worsen or fail to improve.   Meds & Orders: No orders of the defined types were placed in this encounter.   Orders Placed This Encounter  Procedures   Large Joint Inj: R greater trochanter   US Guided Needle Placement - No Linked Charges     Procedures: Large Joint Inj: R greater trochanter on 08/27/2023 8:55 AM Indications: pain Details: 22 G 3.5 in needle, ultrasound-guided lateral approach Medications: 2 mL lidocaine 1 %; 2 mL bupivacaine 0.25 %; 6 mg betamethasone acetate-betamethasone sodium phosphate 6 (3-3) MG/ML Outcome: tolerated well, no immediate complications  US-Guided Greater Trochanteric Bursa Injection, Right After discussion on risks/benefits/indications and informed verbal consent was obtained, a timeout was performed. The patient was lying in lateral recumbent position on exam table. Using ultrasound guidance, the greater trochanter was identified. The area overlying the trochanteric bursa was then prepped with Betadine and alcohol swabs. Following sterile precautions, ultrasound was reapplied to visualize needle guidance with a 22-gauge 3.5" needle utilizing an in-plane approach to inject the bursa with 2:2:1 lidocaine:bupivicaine:betamethasone. Delivery of the injectate was visualized into the region of hypoechoic fluid of the greater trochanteric bursa. Patient tolerated procedure well without immediate complications.    Procedure, treatment alternatives, risks and benefits explained, specific  risks discussed. Consent was given by the patient. Immediately prior to procedure a time out was called to verify the correct patient, procedure, equipment, support staff and site/side marked as required. Patient was prepped and draped in the usual sterile fashion.          Clinical  History: No specialty comments available.  She reports that she has never smoked. She has never used smokeless tobacco.  Recent Labs    09/26/22 0728  HGBA1C 5.8    Objective:   Vital Signs: LMP 04/02/2018   Physical Exam  Gen: Well-appearing, in no acute distress; non-toxic CV: Well-perfused. Warm.  Resp: Breathing unlabored on room air; no wheezing. Psych: Fluid speech in conversation; appropriate affect; normal thought process Neuro: Sensation intact throughout. No gross coordination deficits.   Ortho Exam  - Right hip/Lumbar: + TTP over the right greater trochanter.  There is weakness with hip abduction on the right compared to full strength on the left.  There is some pain with FADIR and FABER testing on the right.  Positive straight leg raise on the right.  Imaging:  XR HIP UNILAT W OR W/O PELVIS 2-3 VIEWS RIGHT 2 views of the right hip including AP and lateral femoral ordered and  reviewed by myself.  X-rays demonstrate relatively well-preserved joint  space, there is some mild OA with narrowing on the medial aspect of the  femoral acetabular joint.  There is bony spurring of the overlying  acetabular rim, suggestive of hip impingement.  No acute fracture or other  bony abnormality noted.  Mild SI joint sclerosis bilaterally.   Past Medical/Family/Surgical/Social History: Medications & Allergies reviewed per EMR, new medications updated. Patient Active Problem List   Diagnosis Date Noted   Migraine without status migrainosus, not intractable 05/06/2023   Symptomatic mammary hypertrophy 12/02/2021   Back pain 12/02/2021   Neck pain 12/02/2021   Morbid obesity due to excess calories (HCC) 04/21/2019   OSA (obstructive sleep apnea) 04/15/2018   Herpes simplex viral infection 07/10/2017   S/P lumbar laminectomy 04/22/2017   GERD (gastroesophageal reflux disease) 07/25/2016   Prediabetes 07/25/2016   HTN (hypertension) 12/24/2015   Preventative health care  10/16/2014   CMC arthritis 07/06/2014   S/P excision of ganglion cyst 07/06/2014   Synovitis 07/06/2014   Past Medical History:  Diagnosis Date   Allergy    GERD (gastroesophageal reflux disease)    Hyperglycemia    Hypertension    OSA (obstructive sleep apnea) 04/15/2018   PONV (postoperative nausea and vomiting)    Sleep apnea    wears cpap    Uterine fibroid    Family History  Problem Relation Age of Onset   Hypertension Mother    Diabetes Mother    Sleep apnea Mother    Obesity Mother    Stroke Father    Hypertension Father        smoker   COPD Father    Single kidney Sister        removed as a child   Heart attack Neg Hx    Hyperlipidemia Neg Hx    Sudden death Neg Hx    Colon cancer Neg Hx    Colon polyps Neg Hx    Esophageal cancer Neg Hx    Stomach cancer Neg Hx    Rectal cancer Neg Hx    Past Surgical History:  Procedure Laterality Date   BUNIONECTOMY     Lt-01/07/06, Rt-09/15/07   CHOLECYSTECTOMY     CYST REMOVAL HAND  Left Wrist   LUMBAR LAMINECTOMY/DECOMPRESSION MICRODISCECTOMY Right 04/22/2017   Procedure: Extraforaminal Microdiscectomy - Lumbar two-Lumbar three - right;  Surgeon: Tia Alert, MD;  Location: [facility] OR;  Service: Neurosurgery;  Laterality: Right;   lumbar surgery revision  04/24/2017   WOUND EXPLORATION N/A 05/22/2017   Procedure: Revision Of Lumbar Wound;  Surgeon: Tia Alert, MD;  Location: [facility] OR;  Service: Neurosurgery;  Laterality: N/A;  Lumbar wound revision   Social History   Occupational History   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

## 2023-09-07 ENCOUNTER — Other Ambulatory Visit (HOSPITAL_COMMUNITY): Payer: Self-pay

## 2023-09-07 ENCOUNTER — Other Ambulatory Visit: Payer: Self-pay | Admitting: Sports Medicine

## 2023-09-07 ENCOUNTER — Telehealth: Payer: Self-pay | Admitting: Sports Medicine

## 2023-09-07 MED ORDER — METHYLPREDNISOLONE 4 MG PO TBPK
ORAL_TABLET | ORAL | 0 refills | Status: DC
  Filled 2023-09-07: qty 21, 6d supply, fill #0

## 2023-09-07 NOTE — Telephone Encounter (Signed)
Patient would like a RX for prednisone patch and something for pain. Send to [facility] outpatient pharmacy on [address].

## 2023-10-05 ENCOUNTER — Other Ambulatory Visit: Payer: Self-pay | Admitting: Family

## 2023-10-05 ENCOUNTER — Other Ambulatory Visit (HOSPITAL_BASED_OUTPATIENT_CLINIC_OR_DEPARTMENT_OTHER): Payer: Self-pay

## 2023-10-05 DIAGNOSIS — Z1231 Encounter for screening mammogram for malignant neoplasm of breast: Secondary | ICD-10-CM

## 2023-11-08 ENCOUNTER — Other Ambulatory Visit: Payer: Self-pay | Admitting: Family

## 2023-11-09 ENCOUNTER — Other Ambulatory Visit: Payer: Self-pay

## 2023-11-09 ENCOUNTER — Other Ambulatory Visit (HOSPITAL_COMMUNITY): Payer: Self-pay

## 2023-11-09 MED ORDER — METOPROLOL SUCCINATE ER 100 MG PO TB24
100.0000 mg | ORAL_TABLET | Freq: Every day | ORAL | 1 refills | Status: DC
  Filled 2023-11-09: qty 90, 90d supply, fill #0
  Filled 2024-02-15 – 2024-02-16 (×3): qty 90, 90d supply, fill #1

## 2023-11-16 ENCOUNTER — Ambulatory Visit (HOSPITAL_BASED_OUTPATIENT_CLINIC_OR_DEPARTMENT_OTHER): Payer: Commercial Managed Care - PPO

## 2023-11-23 ENCOUNTER — Ambulatory Visit (HOSPITAL_BASED_OUTPATIENT_CLINIC_OR_DEPARTMENT_OTHER): Payer: Commercial Managed Care - PPO

## 2023-11-23 ENCOUNTER — Ambulatory Visit: Payer: Commercial Managed Care - PPO | Admitting: Family

## 2023-11-23 ENCOUNTER — Encounter: Payer: Commercial Managed Care - PPO | Admitting: Family

## 2023-11-30 ENCOUNTER — Ambulatory Visit: Payer: Commercial Managed Care - PPO

## 2023-12-08 ENCOUNTER — Encounter: Payer: Commercial Managed Care - PPO | Admitting: Family

## 2023-12-09 ENCOUNTER — Ambulatory Visit
Admission: RE | Admit: 2023-12-09 | Discharge: 2023-12-09 | Disposition: A | Discharge status: Home / Self Care | Payer: Commercial Managed Care - PPO | Source: Ambulatory Visit | Attending: Family | Admitting: Family

## 2023-12-09 DIAGNOSIS — Z1231 Encounter for screening mammogram for malignant neoplasm of breast: Secondary | ICD-10-CM | POA: Diagnosis not present

## 2024-01-04 ENCOUNTER — Other Ambulatory Visit: Payer: Self-pay | Admitting: Family

## 2024-01-04 ENCOUNTER — Other Ambulatory Visit (HOSPITAL_COMMUNITY): Payer: Self-pay

## 2024-01-04 MED ORDER — VALACYCLOVIR HCL 500 MG PO TABS
500.0000 mg | ORAL_TABLET | Freq: Every day | ORAL | 1 refills | Status: DC
  Filled 2024-01-04: qty 90, 90d supply, fill #0
  Filled 2024-03-29: qty 90, 90d supply, fill #1

## 2024-01-04 MED ORDER — OMEPRAZOLE 40 MG PO CPDR
40.0000 mg | DELAYED_RELEASE_CAPSULE | Freq: Every day | ORAL | 1 refills | Status: DC | PRN
  Filled 2024-01-04: qty 90, 90d supply, fill #0
  Filled 2024-03-29: qty 90, 90d supply, fill #1

## 2024-01-15 ENCOUNTER — Other Ambulatory Visit: Payer: Self-pay | Admitting: Sports Medicine

## 2024-01-15 ENCOUNTER — Other Ambulatory Visit (HOSPITAL_COMMUNITY): Payer: Self-pay

## 2024-01-15 MED ORDER — IBUPROFEN 800 MG PO TABS
800.0000 mg | ORAL_TABLET | Freq: Three times a day (TID) | ORAL | 0 refills | Status: DC | PRN
  Filled 2024-01-15: qty 30, 10d supply, fill #0

## 2024-01-18 ENCOUNTER — Telehealth: Payer: Self-pay

## 2024-01-18 ENCOUNTER — Encounter: Payer: Commercial Managed Care - PPO | Admitting: Family

## 2024-01-18 NOTE — Telephone Encounter (Signed)
 Patient has seen Shon Baton several times about her hip. She would like the xrays to be looked at to see if this is something that should be corrected sooner than later?

## 2024-01-18 NOTE — Telephone Encounter (Signed)
 Denise Austin

## 2024-01-25 ENCOUNTER — Other Ambulatory Visit (HOSPITAL_COMMUNITY): Payer: Self-pay

## 2024-01-25 ENCOUNTER — Other Ambulatory Visit: Payer: Self-pay | Admitting: Sports Medicine

## 2024-01-25 DIAGNOSIS — M25851 Other specified joint disorders, right hip: Secondary | ICD-10-CM

## 2024-01-25 DIAGNOSIS — G8929 Other chronic pain: Secondary | ICD-10-CM

## 2024-01-25 MED ORDER — DIAZEPAM 5 MG PO TABS
ORAL_TABLET | ORAL | 0 refills | Status: DC
  Filled 2024-01-25: qty 2, 1d supply, fill #0

## 2024-02-06 ENCOUNTER — Ambulatory Visit
Admission: RE | Admit: 2024-02-06 | Discharge: 2024-02-06 | Disposition: A | Discharge status: Home / Self Care | Source: Ambulatory Visit | Attending: Sports Medicine | Admitting: Sports Medicine

## 2024-02-06 DIAGNOSIS — M25851 Other specified joint disorders, right hip: Secondary | ICD-10-CM

## 2024-02-06 DIAGNOSIS — M25551 Pain in right hip: Secondary | ICD-10-CM | POA: Diagnosis not present

## 2024-02-06 DIAGNOSIS — G8929 Other chronic pain: Secondary | ICD-10-CM

## 2024-02-15 ENCOUNTER — Other Ambulatory Visit (HOSPITAL_BASED_OUTPATIENT_CLINIC_OR_DEPARTMENT_OTHER): Payer: Self-pay

## 2024-02-15 ENCOUNTER — Other Ambulatory Visit: Payer: Self-pay

## 2024-02-15 ENCOUNTER — Other Ambulatory Visit: Payer: Self-pay | Admitting: Sports Medicine

## 2024-02-15 ENCOUNTER — Other Ambulatory Visit: Payer: Self-pay | Admitting: Family

## 2024-02-15 MED ORDER — IBUPROFEN 800 MG PO TABS
800.0000 mg | ORAL_TABLET | Freq: Three times a day (TID) | ORAL | 0 refills | Status: DC | PRN
  Filled 2024-02-15 – 2024-02-16 (×3): qty 30, 10d supply, fill #0

## 2024-02-15 MED ORDER — LOSARTAN POTASSIUM 50 MG PO TABS
50.0000 mg | ORAL_TABLET | Freq: Every day | ORAL | 1 refills | Status: DC
  Filled 2024-02-15 – 2024-02-16 (×3): qty 90, 90d supply, fill #0
  Filled 2024-05-11: qty 90, 90d supply, fill #1

## 2024-02-16 ENCOUNTER — Other Ambulatory Visit: Payer: Self-pay

## 2024-02-16 ENCOUNTER — Other Ambulatory Visit (HOSPITAL_BASED_OUTPATIENT_CLINIC_OR_DEPARTMENT_OTHER): Payer: Self-pay

## 2024-02-16 ENCOUNTER — Other Ambulatory Visit (HOSPITAL_COMMUNITY): Payer: Self-pay

## 2024-02-22 ENCOUNTER — Ambulatory Visit: Admitting: Orthopaedic Surgery

## 2024-02-22 ENCOUNTER — Other Ambulatory Visit (INDEPENDENT_AMBULATORY_CARE_PROVIDER_SITE_OTHER): Payer: Self-pay

## 2024-02-22 VITALS — Ht 65.5 in | Wt 240.0 lb

## 2024-02-22 DIAGNOSIS — M25851 Other specified joint disorders, right hip: Secondary | ICD-10-CM | POA: Diagnosis not present

## 2024-02-22 DIAGNOSIS — M1611 Unilateral primary osteoarthritis, right hip: Secondary | ICD-10-CM | POA: Insufficient documentation

## 2024-02-22 DIAGNOSIS — M25551 Pain in right hip: Secondary | ICD-10-CM

## 2024-02-22 DIAGNOSIS — G8929 Other chronic pain: Secondary | ICD-10-CM

## 2024-02-22 NOTE — Progress Notes (Signed)
 Denise Austin has been dealing with right hip pain for well over a year now.  She has had an intra-articular steroid injection in her right hip as well as an injection over the trochanteric area of that hip.  She eventually had an MRI of the right hip.  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  There is also subchondral edema in the femoral head and acetabulum with moderate cartilage loss.  Her plain films do show more uncoverage of the femoral head on the right side than the left side showing this is certainly chronic issue and likely somewhat congenital in nature.  She is only 53 years old and works here in our office.  She has a harder time putting her shoes and socks on left side.  At this point her right hip pain is daily and it is detrimentally affecting her mobility, her quality of life and her actives daily living.  Her BMI is 39.33 but she is not a diabetic.  When I lay her in a supine position her leg lengths are equal.  She has significant pain with internal and external rotation of her right hip and stiffness on rotation.  Her left hip moves fluidly and normally with no pain at all.  I did go over all imaging studies with her and showed her the MRI of her right hip.  At this point the only surgical option would be a hip replacement.  She is not a candidate for hip arthroscopic surgery given the extent of the labral tearing and the degenerative changes in her hip.  Arthroscopic surgery would likely make this worse.  Steroid injections have helped in the past but are no longer helpful.  I did go over hip replacement surgery with her in detail and went over hip replacement model and showed her actual implants.  She is interested in having a hip replacement for that right side sometime later in the summer.  She does take care of a grandchild that will need tubes in the ears later in June.  All question concerns were answered addressed.  She will let us  know about scheduling  surgery.  I did discuss the risk and benefits of surgery and what to expect from an intraoperative and postoperative course.

## 2024-02-29 ENCOUNTER — Ambulatory Visit: Admitting: Orthopaedic Surgery

## 2024-03-14 ENCOUNTER — Ambulatory Visit: Admitting: Orthopaedic Surgery

## 2024-03-18 ENCOUNTER — Encounter: Admitting: Family

## 2024-03-29 ENCOUNTER — Other Ambulatory Visit: Payer: Self-pay

## 2024-03-29 ENCOUNTER — Other Ambulatory Visit (INDEPENDENT_AMBULATORY_CARE_PROVIDER_SITE_OTHER): Payer: Self-pay | Admitting: Orthopedic Surgery

## 2024-03-29 ENCOUNTER — Other Ambulatory Visit (HOSPITAL_COMMUNITY): Payer: Self-pay

## 2024-03-29 MED ORDER — GABAPENTIN 300 MG PO CAPS
300.0000 mg | ORAL_CAPSULE | Freq: Two times a day (BID) | ORAL | 2 refills | Status: AC
  Filled 2024-03-29: qty 60, 30d supply, fill #0
  Filled 2024-10-25: qty 60, 30d supply, fill #1
  Filled 2024-12-01: qty 60, 30d supply, fill #2

## 2024-04-15 ENCOUNTER — Encounter: Admitting: Family

## 2024-05-06 ENCOUNTER — Other Ambulatory Visit (HOSPITAL_COMMUNITY): Payer: Self-pay

## 2024-05-06 ENCOUNTER — Ambulatory Visit (INDEPENDENT_AMBULATORY_CARE_PROVIDER_SITE_OTHER): Admitting: Family

## 2024-05-06 ENCOUNTER — Encounter (HOSPITAL_COMMUNITY): Payer: Self-pay

## 2024-05-06 VITALS — BP 122/80 | HR 60 | Temp 97.9°F | Ht 65.0 in | Wt 246.0 lb

## 2024-05-06 DIAGNOSIS — B009 Herpesviral infection, unspecified: Secondary | ICD-10-CM | POA: Diagnosis not present

## 2024-05-06 DIAGNOSIS — Z0000 Encounter for general adult medical examination without abnormal findings: Secondary | ICD-10-CM | POA: Diagnosis not present

## 2024-05-06 DIAGNOSIS — G4733 Obstructive sleep apnea (adult) (pediatric): Secondary | ICD-10-CM | POA: Diagnosis not present

## 2024-05-06 DIAGNOSIS — I10 Essential (primary) hypertension: Secondary | ICD-10-CM

## 2024-05-06 DIAGNOSIS — K219 Gastro-esophageal reflux disease without esophagitis: Secondary | ICD-10-CM | POA: Diagnosis not present

## 2024-05-06 DIAGNOSIS — G43909 Migraine, unspecified, not intractable, without status migrainosus: Secondary | ICD-10-CM | POA: Diagnosis not present

## 2024-05-06 DIAGNOSIS — R7303 Prediabetes: Secondary | ICD-10-CM | POA: Diagnosis not present

## 2024-05-06 DIAGNOSIS — Z23 Encounter for immunization: Secondary | ICD-10-CM

## 2024-05-06 LAB — COMPREHENSIVE METABOLIC PANEL WITH GFR
ALT: 15 U/L (ref 0–35)
AST: 14 U/L (ref 0–37)
Albumin: 4.2 g/dL (ref 3.5–5.2)
Alkaline Phosphatase: 71 U/L (ref 39–117)
BUN: 15 mg/dL (ref 6–23)
CO2: 31 meq/L (ref 19–32)
Calcium: 9 mg/dL (ref 8.4–10.5)
Chloride: 107 meq/L (ref 96–112)
Creatinine, Ser: 0.8 mg/dL (ref 0.40–1.20)
GFR: 83.93 mL/min (ref >60.00)
Glucose, Bld: 83 mg/dL (ref 70–99)
Potassium: 4.1 meq/L (ref 3.5–5.1)
Sodium: 141 meq/L (ref 135–145)
Total Bilirubin: 0.6 mg/dL (ref 0.2–1.2)
Total Protein: 6.6 g/dL (ref 6.0–8.3)

## 2024-05-06 LAB — HEMOGLOBIN A1C: Hgb A1c MFr Bld: 6.1 % (ref 4.6–6.5)

## 2024-05-06 MED ORDER — METOPROLOL SUCCINATE ER 100 MG PO TB24
100.0000 mg | ORAL_TABLET | Freq: Every day | ORAL | 1 refills | Status: DC
  Filled 2024-05-06 – 2024-05-31 (×2): qty 90, 90d supply, fill #0
  Filled 2024-09-06: qty 90, 90d supply, fill #1

## 2024-05-06 MED ORDER — TIRZEPATIDE-WEIGHT MANAGEMENT 2.5 MG/0.5ML ~~LOC~~ SOAJ
2.5000 mg | SUBCUTANEOUS | 0 refills | Status: DC
  Filled 2024-05-06 – 2024-05-16 (×4): qty 2, 28d supply, fill #0

## 2024-05-06 MED ORDER — VALACYCLOVIR HCL 500 MG PO TABS
500.0000 mg | ORAL_TABLET | Freq: Every day | ORAL | 1 refills | Status: AC
  Filled 2024-05-06 – 2024-07-11 (×2): qty 90, 90d supply, fill #0
  Filled 2024-10-25: qty 90, 90d supply, fill #1

## 2024-05-06 MED ORDER — OMEPRAZOLE 20 MG PO CPDR
20.0000 mg | DELAYED_RELEASE_CAPSULE | Freq: Every day | ORAL | 1 refills | Status: AC
  Filled 2024-05-06 – 2024-06-28 (×2): qty 90, 90d supply, fill #0
  Filled 2024-09-18: qty 90, 90d supply, fill #1

## 2024-05-06 NOTE — Assessment & Plan Note (Signed)
 Stable.  Continue metoprolol and losartan.

## 2024-05-06 NOTE — Assessment & Plan Note (Signed)
 Lab Results  Component Value Date   HGBA1C 5.8 09/26/2022   Will update A1C.

## 2024-05-06 NOTE — Addendum Note (Signed)
 Addended by: DARYL SETTER on: 05/06/2024 01:44 PM   Modules accepted: Level of Service

## 2024-05-06 NOTE — Assessment & Plan Note (Signed)
 No breakouts with daily valtrex  suppressive therapy-continue same.

## 2024-05-06 NOTE — Patient Instructions (Signed)
 VISIT SUMMARY:  Today, you had your annual physical exam. We discussed your sleep apnea, right shoulder pain, hypertension, GERD, and herpes simplex virus management. We also reviewed your general health maintenance and immunizations.  YOUR PLAN:  OBSTRUCTIVE SLEEP APNEA: You are experiencing issues with your CPAP mask leaking, which may be affecting your sleep quality. -We will refer you to Dr. Neysa for further management of your sleep apnea. -Please contact the company to get a new CPAP mask. -We will try to get approval for Zepbound  to help with your sleep apnea treatment.  RIGHT SHOULDER LABRAL TEAR: You have a chronic tear in your right shoulder that is causing significant pain and limiting your mobility. -Proceed with your scheduled surgery in August.  HYPERTENSION: Your blood pressure is well-controlled with your current medications, losartan  and Toprol  XL. -Continue taking your medications as prescribed.  GASTROESOPHAGEAL REFLUX DISEASE (GERD): Your GERD is well-managed with omeprazole , and you are sometimes asymptomatic even without the medication. -Reduce your omeprazole  dose to 20 mg and monitor your symptoms.  HERPES SIMPLEX VIRUS INFECTION: Your herpes simplex virus is effectively managed with daily Valtrex . -We will refill your Valtrex  prescription.  GENERAL HEALTH MAINTENANCE: Your immunizations are up to date except for the second dose of the shingles vaccine. Your physical activity has decreased due to pain. -We will administer the second dose of the Shingrix  vaccine. -We will check your A1c, kidney, and liver function tests. -Continue maintaining a healthy diet. We can discuss potential weight loss injections to help with your sleep apnea.

## 2024-05-06 NOTE — Progress Notes (Signed)
 Subjective:     Patient ID: Denise Austin, female    DOB: 1971/09/15, 53 y.o.   MRN: [id number]  Chief Complaint  Patient presents with   Annual Exam    Physical; no concerns    HPI  Discussed the use of AI scribe software for clinical note transcription with the patient, who gave verbal consent to proceed.  History of Present Illness   Denise Austin is a 53 year old female who presents for an annual physical exam.  She uses a CPAP machine for sleep apnea but suspects a mask leak. Her hypertension is controlled with losartan  and Toprol  XL, with a blood pressure of 122/80 mmHg. Valtrex  effectively manages her herpes simplex virus. Omeprazole  controls her gastroesophageal reflux disease, and she sometimes skips doses without symptoms. A torn right hip labrum causes significant pain, limiting mobility and exercise, with surgery scheduled for August. Immunizations are current except for the second shingles vaccine dose. She maintains a healthy diet but has decreased physical activity due to pain. No recent cough, cold symptoms, skin issues, hearing or vision concerns, constipation, diarrhea, urinary issues, or mental health concerns. Headaches are well-managed.  Immunizations: due for shingrix  #2 Diet: healthy Exercise: limited due to labral tear on right hip- surgery scheduled Colonoscopy: due 2/27 Pap Smear: due 2/28 Mammogram: 2/25 Vision: up to date Dental: up to date      Health Maintenance Due  Topic Date Due   COVID-19 Vaccine (1 - 2024-25 season) Never done    Past Medical History:  Diagnosis Date   Allergy    GERD (gastroesophageal reflux disease)    Hyperglycemia    Hypertension    OSA (obstructive sleep apnea) 04/15/2018   PONV (postoperative nausea and vomiting)    Sleep apnea    wears cpap    Uterine fibroid     Past Surgical History:  Procedure Laterality Date   BUNIONECTOMY     Lt-01/07/06, Rt-09/15/07   CHOLECYSTECTOMY     CYST REMOVAL  HAND     Left Wrist   LUMBAR LAMINECTOMY/DECOMPRESSION MICRODISCECTOMY Right 04/22/2017   Procedure: Extraforaminal Microdiscectomy - Lumbar two-Lumbar three - right;  Surgeon: Joshua Alm RAMAN, MD;  Location: [facility] OR;  Service: Neurosurgery;  Laterality: Right;   lumbar surgery revision  04/24/2017   WOUND EXPLORATION N/A 05/22/2017   Procedure: Revision Of Lumbar Wound;  Surgeon: Joshua Alm RAMAN, MD;  Location[facility] OR;  Service: Neurosurgery;  Laterality: N/A;  Lumbar wound revision    Family History  Problem Relation Age of Onset   Hypertension Mother    Diabetes Mother    Sleep apnea Mother    Obesity Mother    Stroke Father    Hypertension Father        smoker   COPD Father    Single kidney Sister        removed as a child   Heart attack Neg Hx    Hyperlipidemia Neg Hx    Sudden death Neg Hx    Colon cancer Neg Hx    Colon polyps Neg Hx    Esophageal cancer Neg Hx    Stomach cancer Neg Hx    Rectal cancer Neg Hx     Social History   Socioeconomic History   Marital status: Single    Spouse name: Not on file   Number of children: Not on file   Years of education: Not on file   Highest education level: Some college, no degree  Occupational  History   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  Other Topics Concern   Not on file  Social History Narrative   Has 2 children (2 boys)- one son in [place] and one is local   3 grandchildren 2 boys, 1 granddaughter   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   Engaged    Lives with fiance   Enjoys shopping, reading, spending time with mom and sisters   Social Drivers of Manufacturing engineer Strain: Low Risk  (05/06/2024)   Overall Financial Resource Strain (CARDIA)    Difficulty of Paying Living Expenses: Not hard at all  Food Insecurity: No Food Insecurity (05/06/2024)   Hunger Vital Sign    Worried About Running Out of Food in the Last Year: Never true    Ran Out of Food in the Last Year: Never true  Transportation Needs: No Transportation Needs (05/06/2024)   PRAPARE - Administrator, Civil Service (Medical): No    Lack of Transportation (Non-Medical): No  Physical Activity: Insufficiently Active (05/06/2024)   Exercise Vital Sign    Days of Exercise per Week: 3 days    Minutes of Exercise per Session: 20 min  Stress: No Stress Concern Present (05/06/2024)   [employer] of Occupational Health - Occupational Stress Questionnaire    Feeling of Stress: Not at all  Social Connections: Unknown (05/06/2024)   Social Connection and Isolation Panel    Frequency of Communication with Friends and Family: More than three times a week    Frequency of Social Gatherings with Friends and Family: Three times a week    Attends Religious Services: More than 4 times per year    Active Member of Clubs or Organizations: No    Attends Engineer, structural: Not on file    Marital Status: Patient declined  Intimate Partner Violence: Not on file    Outpatient Medications Prior to Visit  Medication Sig Dispense Refill   BIOTIN PO Take 1 tablet by mouth daily. Gummy vitamin     gabapentin  (NEURONTIN ) 300 MG capsule Take 1 capsule (300 mg total) by mouth 2 (two) times daily. 60 capsule 2   ibuprofen  (ADVIL ) 800 MG tablet Take 1 tablet (800 mg total) by mouth every 8 (eight) hours as needed. 30 tablet 0   losartan  (COZAAR ) 50 MG tablet Take 1 tablet (50 mg total) by mouth daily. 90 tablet 1   SUMAtriptan  (IMITREX ) 50 MG tablet Take 1 tablet (50 mg total) by mouth every 2 (two) hours as needed for migraine. May repeat in 2 hours if headache persists or  recurs. 10 tablet 2   metoprolol  succinate (TOPROL -XL) 100 MG 24 hr tablet Take 1 tablet (100 mg total) by mouth daily. Take with or immediately following a meal. 90 tablet 1   omeprazole  (PRILOSEC) 40 MG capsule Take 1 capsule (40 mg total) by mouth daily as needed. 90 capsule 1   valACYclovir  (VALTREX ) 500 MG tablet Take 1 tablet (500 mg total) by mouth daily. 90 tablet 1   Probiotic Product (ALIGN) 10 MG  CAPS as directed Orally once a day     diazepam  (VALIUM ) 5 MG tablet Take 1 tablet about 45 mins prior to MRI procedure, if after 30-mins you still need additional sedation for anxiety - may take 2nd tablet. 2 tablet 0   methylPREDNISolone  (MEDROL  DOSEPAK) 4 MG TBPK tablet Take as directed on package. 21 each 0   No facility-administered medications prior to visit.    No Known Allergies  Review of Systems  Constitutional:  Negative for weight loss.  HENT:  Negative for congestion and hearing loss.   Eyes:  Negative for blurred vision.  Respiratory:  Negative for cough.   Cardiovascular:  Negative for leg swelling.  Gastrointestinal:  Negative for constipation and diarrhea.  Genitourinary:  Negative for dysuria and frequency.  Musculoskeletal:  Positive for joint pain.  Skin:  Negative for rash.  Neurological:  Negative for headaches.  Psychiatric/Behavioral:         Denies depression/anxiety   Lab Results  Component Value Date   CHOL 136 12/24/2021   HDL 50 12/24/2021   LDLCALC 72 12/24/2021   TRIG 72 12/24/2021   CHOLHDL 2.7 09/13/2021       Objective:    Physical Exam   BP 122/80   Pulse 60   Temp 97.9 F (36.6 C)   Ht 5' 5 (1.651 m)   Wt 246 lb (111.6 kg)   LMP 04/02/2018   SpO2 95%   BMI 40.94 kg/m  Wt Readings from Last 3 Encounters:  05/06/24 246 lb (111.6 kg)  02/22/24 240 lb (108.9 kg)  05/05/23 237 lb (107.5 kg)   Physical Exam  Constitutional: She is oriented to person, place, and time. She appears well-developed and well-nourished. No distress.   HENT:  Head: Normocephalic and atraumatic.  Right Ear: Tympanic membrane and ear canal normal.  Left Ear: Tympanic membrane and ear canal normal.  Mouth/Throat: Oropharynx is clear and moist.  Eyes: Pupils are equal, round, and reactive to light. No scleral icterus.  Neck: Normal range of motion. No thyromegaly present.  Cardiovascular: Normal rate and regular rhythm.   No murmur heard. Pulmonary/Chest: Effort normal and breath sounds normal. No respiratory distress. He has no wheezes. She has no rales. She exhibits no tenderness.  Abdominal: Soft. Bowel sounds are normal. She exhibits no distension and no mass. There is no tenderness. There is no rebound and no guarding.  Musculoskeletal: She exhibits no edema.  Lymphadenopathy:    She has no cervical adenopathy.  Neurological: She is alert and oriented to person, place, and time. She has normal patellar reflexes. She exhibits normal muscle tone. Coordination normal.  Skin: Skin is warm and dry.  Psychiatric: She has a normal mood and affect. Her behavior is normal. Judgment and thought content normal.  Breast/Pelvic: deferred          Assessment & Plan:       Assessment & Plan:   Problem List Items Addressed This Visit       Unprioritized   Preventative health care - Primary    Current except for second shingles vaccine. Reduced physical activity due to pain. - Administer second dose of Shingrix  vaccine. - Check A1c, kidney, and liver function tests. - Encourage healthy diet and discuss potential for weight loss injections for sleep apnea.      Prediabetes   Lab Results  Component Value Date   HGBA1C 5.8 09/26/2022   Will update A1C.       Relevant Orders  HgB A1c   OSA (obstructive sleep apnea)   Will update referral to Dr. Neysa, continues cpap. Recommended that she request new mask/tubing in the meantime.       Relevant Medications   tirzepatide  (ZEPBOUND ) 2.5 MG/0.5ML Pen   Other Relevant Orders    Ambulatory referral to Pulmonology   Migraine without status migrainosus, not intractable   Stable- keeps imitrex  on hand for prn use.       Relevant Medications   metoprolol  succinate (TOPROL -XL) 100 MG 24 hr tablet   HTN (hypertension)   Stable. Continue metoprolol  and losartan .       Relevant Medications   metoprolol  succinate (TOPROL -XL) 100 MG 24 hr tablet   Other Relevant Orders   Comp Met (CMET)   Herpes simplex viral infection   No breakouts with daily valtrex  suppressive therapy-continue same.       Relevant Medications   valACYclovir  (VALTREX ) 500 MG tablet   GERD (gastroesophageal reflux disease)   Stable on 40mg . Will see if she can tolerate decreasing to 20mg .       Relevant Medications   Probiotic Product (ALIGN) 10 MG CAPS   omeprazole  (PRILOSEC) 20 MG capsule   Other Visit Diagnoses       Need for shingles vaccine       Relevant Orders   Zoster, Recombinant (Shingrix ) (Completed)       I have discontinued Nakenya L. Adamek's methylPREDNISolone , omeprazole , and diazepam . I am also having her start on omeprazole  and tirzepatide . Additionally, I am having her maintain her BIOTIN PO, SUMAtriptan , losartan , ibuprofen , gabapentin , Align, valACYclovir , and metoprolol  succinate.  Meds ordered this encounter  Medications   omeprazole  (PRILOSEC) 20 MG capsule    Sig: Take 1 capsule (20 mg total) by mouth daily.    Dispense:  90 capsule    Refill:  1    Supervising Provider:   DOMENICA BLACKBIRD A [[id number]]   tirzepatide  (ZEPBOUND ) 2.5 MG/0.5ML Pen    Sig: Inject 2.5 mg into the skin once a week.    Dispense:  2 mL    Refill:  0    Supervising Provider:   DOMENICA BLACKBIRD A [[id number]]   valACYclovir  (VALTREX ) 500 MG tablet    Sig: Take 1 tablet (500 mg total) by mouth daily.    Dispense:  90 tablet    Refill:  1    Supervising Provider:   DOMENICA BLACKBIRD A [[id number]]   metoprolol  succinate (TOPROL -XL) 100 MG 24 hr tablet    Sig: Take 1 tablet (100 mg total) by mouth daily.  Take with or immediately following a meal.    Dispense:  90 tablet    Refill:  1    Supervising Provider:   DOMENICA BLACKBIRD A [[id number]]

## 2024-05-06 NOTE — Assessment & Plan Note (Signed)
 Stable on 40mg . Will see if she can tolerate decreasing to 20mg .

## 2024-05-06 NOTE — Assessment & Plan Note (Signed)
 Stable- keeps imitrex  on hand for prn use.

## 2024-05-06 NOTE — Assessment & Plan Note (Signed)
 Will update referral to Dr. Neysa, continues cpap. Recommended that she request new mask/tubing in the meantime.

## 2024-05-06 NOTE — Assessment & Plan Note (Signed)
  Current except for second shingles vaccine. Reduced physical activity due to pain. - Administer second dose of Shingrix  vaccine. - Check A1c, kidney, and liver function tests. - Encourage healthy diet and discuss potential for weight loss injections for sleep apnea.

## 2024-05-08 ENCOUNTER — Ambulatory Visit: Payer: Self-pay | Admitting: Family

## 2024-05-09 ENCOUNTER — Telehealth: Payer: Self-pay

## 2024-05-09 ENCOUNTER — Other Ambulatory Visit (HOSPITAL_COMMUNITY): Payer: Self-pay

## 2024-05-09 NOTE — Telephone Encounter (Signed)
 Pharmacy Patient Advocate Encounter   Received notification from Pt Calls Messages that prior authorization for Zepbound  2.5mg /0.83ml is required/requested.   Insurance verification completed.   The patient is insured through [facility] .   Per test claim: Zepbound  2.5mg  - Product/Service not covered - Plan/Benefit exclusion.     Prior auth not submitted.

## 2024-05-09 NOTE — Telephone Encounter (Signed)
 Copied from CRM [id number]. Topic: Clinical - Medication Prior Auth >> May 09, 2024 11:54 AM Franky GRADE wrote: Reason for CRM: Patient was informed that the zepbound  prescription needs a prior authorization. They provided patient with a phone number to submit the PA [phone].

## 2024-05-11 ENCOUNTER — Other Ambulatory Visit (HOSPITAL_COMMUNITY): Payer: Self-pay

## 2024-05-13 ENCOUNTER — Other Ambulatory Visit: Payer: Self-pay | Admitting: Physician Assistant

## 2024-05-13 DIAGNOSIS — Z01818 Encounter for other preprocedural examination: Secondary | ICD-10-CM

## 2024-05-16 ENCOUNTER — Other Ambulatory Visit (HOSPITAL_COMMUNITY): Payer: Self-pay

## 2024-05-20 ENCOUNTER — Encounter: Admitting: Family

## 2024-05-21 NOTE — Patient Instructions (Signed)
 SURGICAL WAITING ROOM VISITATION Patients having surgery or a procedure may have no more than 2 support people in the waiting area - these visitors may rotate in the visitor waiting room.   If the patient needs to stay at the hospital during part of their recovery, the visitor guidelines for inpatient rooms apply.  PRE-OP VISITATION  Pre-op nurse will coordinate an appropriate time for 1 support person to accompany the patient in pre-op.  This support person may not rotate.  This visitor will be contacted when the time is appropriate for the visitor to come back in the pre-op area.  Please refer to the [facility] website for the visitor guidelines for Inpatients (after your surgery is over and you are in a regular room).  You are not required to quarantine at this time prior to your surgery. However, you must do this: Hand Hygiene often Do NOT share personal items Notify your provider if you are in close contact with someone who has COVID or you develop fever 100.4 or greater, new onset of sneezing, cough, sore throat, shortness of breath or body aches.  If you test positive for Covid or have been in contact with anyone that has tested positive in the last 10 days please notify you surgeon.    Your procedure is scheduled on:  Friday  June 03, 2024  Report to [facility] Main Entrance: [place] entrance where the [employer] is available.   Report to admitting at: 05:15    AM  Call this number if you have any questions or problems the morning of surgery [phone]  Do not eat food after Midnight the night prior to your surgery/procedure.  After Midnight you may have the following liquids until  04:15 AM  DAY OF SURGERY  Clear Liquid Diet Water Black Coffee (sugar ok, NO MILK/CREAM OR CREAMERS)  Tea (sugar ok, NO MILK/CREAM OR CREAMERS) regular and decaf                             Plain Jell-O  with no fruit (NO RED)                                           Fruit ices  (not with fruit pulp, NO RED)                                     Popsicles (NO RED)                                                                  Juice: NO CITRUS JUICES: only apple, WHITE grape, WHITE cranberry Sports drinks like Gatorade or Powerade (NO RED)                   The day of surgery:  Drink ONE (1) Pre-Surgery G2 at   04:15 AM the morning of surgery. Drink in one sitting. Do not sip.  This drink was given to you during your hospital pre-op appointment visit. Nothing else to drink after  completing the Pre-Surgery G2 : No candy, chewing gum or throat lozenges.    FOLLOW ANY ADDITIONAL PRE OP INSTRUCTIONS YOU RECEIVED FROM YOUR SURGEON'S OFFICE!!!   Oral Hygiene is also important to reduce your risk of infection.        Remember - BRUSH YOUR TEETH THE MORNING OF SURGERY WITH YOUR REGULAR TOOTHPASTE  Do NOT smoke after Midnight the night before surgery.  STOP TAKING all Vitamins, Herbs and supplements 1 week before your surgery.   ZEPBOUND -   Take ONLY these medicines the morning of surgery with A SIP OF WATER: omeprazole , metoprolol , valacyclovir , gabapentin  if needed.   DO NOT TAKE LOSARTAN  the morning of your surgery, 06-03-24    If You have been diagnosed with Sleep Apnea - Bring CPAP mask and tubing day of surgery. We will provide you with a CPAP machine on the day of your surgery.                   You may not have any metal on your body including hair pins, jewelry, and body piercing  Do not wear make-up, lotions, powders, perfumes  or deodorant  Do not wear nail polish including gel and S&S, artificial / acrylic nails, or any other type of covering on natural nails including finger and toenails. If you have artificial nails, gel coating, etc., that needs to be removed by a nail salon, Please have this removed prior to surgery. Not doing so may mean that your surgery could be cancelled or delayed if the Surgeon or anesthesia staff feels like they are unable to  monitor you safely.   Do not shave 48 hours prior to surgery to avoid nicks in your skin which may contribute to postoperative infections.    Contacts, Hearing Aids, dentures or bridgework may not be worn into surgery. DENTURES WILL BE REMOVED PRIOR TO SURGERY PLEASE DO NOT APPLY Poly grip OR ADHESIVES!!!  You may bring a small overnight bag with you on the day of surgery, only pack items that are not valuable. [facility] IS NOT RESPONSIBLE   FOR VALUABLES THAT ARE LOST OR STOLEN.   Do not bring your home medications to the hospital. The Pharmacy will dispense medications listed on your medication list to you during your admission in the Hospital.  Please read over the following fact sheets you were given: IF YOU HAVE QUESTIONS ABOUT YOUR PRE-OP INSTRUCTIONS, PLEASE CALL [phone].     Pre-operative 5 CHG Bath Instructions   You can play a key role in reducing the risk of infection after surgery. Your skin needs to be as free of germs as possible. You can reduce the number of germs on your skin by washing with CHG (chlorhexidine  gluconate) soap before surgery. CHG is an antiseptic soap that kills germs and continues to kill germs even after washing.   DO NOT use if you have an allergy to chlorhexidine /CHG or antibacterial soaps. If your skin becomes reddened or irritated, stop using the CHG and notify one of our RNs at [phone]  Please shower with the CHG soap starting 4 days before surgery using the following schedule: START SHOWERS ON   MONDAY  May 30, 2024  Please keep in mind the following:  DO NOT shave, including legs and underarms, starting the day of your first shower.   You may shave your face at any point before/day of surgery.   Place clean sheets on your bed the day you start using CHG  soap. Use a clean washcloth (not used since being washed) for each shower. DO NOT sleep with pets once you start using the CHG.   CHG Shower Instructions:  If you choose to wash your hair and private area, wash first with your normal shampoo/soap.  After you use shampoo/soap, rinse your hair and body thoroughly to remove shampoo/soap residue.  Turn the water OFF and apply about 3 tablespoons (45 ml) of CHG soap to a CLEAN washcloth.  Apply CHG soap ONLY FROM YOUR NECK DOWN TO YOUR TOES (washing for 3-5 minutes)  DO NOT use CHG soap on face, private areas, open wounds, or sores.  Pay special attention to the area where your surgery is being performed.  If you are having back surgery, having someone wash your back for you may be helpful.  Wait 2 minutes after CHG soap is applied, then you may rinse off the CHG soap.  Pat dry with a clean towel  Put on clean clothes/pajamas   If you choose to wear lotion, please use ONLY the CHG-compatible lotions on the back of this paper.     Additional instructions for the day of surgery: DO NOT APPLY any lotions, deodorants, cologne, or perfumes.   Put on clean/comfortable clothes.  Brush your teeth.  Ask your nurse before applying any prescription medications to the skin.      CHG Compatible Lotions   Aveeno Moisturizing lotion  Cetaphil Moisturizing Cream  Cetaphil Moisturizing Lotion  Clairol Herbal Essence Moisturizing Lotion, Dry Skin  Clairol Herbal Essence Moisturizing Lotion, Extra Dry Skin  Clairol Herbal Essence Moisturizing Lotion, Normal Skin  Curel Age Defying Therapeutic Moisturizing Lotion with Alpha Hydroxy  Curel Extreme Care Body Lotion  Curel Soothing Hands Moisturizing Hand Lotion  Curel Therapeutic Moisturizing Cream, Fragrance-Free  Curel Therapeutic Moisturizing Lotion, Fragrance-Free  Curel Therapeutic Moisturizing Lotion, Original Formula  Eucerin Daily Replenishing Lotion  Eucerin Dry Skin Therapy Plus Alpha  Hydroxy Crme  Eucerin Dry Skin Therapy Plus Alpha Hydroxy Lotion  Eucerin Original Crme  Eucerin Original Lotion  Eucerin Plus Crme Eucerin Plus Lotion  Eucerin TriLipid Replenishing Lotion  Keri Anti-Bacterial Hand Lotion  Keri Deep Conditioning Original Lotion Dry Skin Formula Softly Scented  Keri Deep Conditioning Original Lotion, Fragrance Free Sensitive Skin Formula  Keri Lotion Fast Absorbing Fragrance Free Sensitive Skin Formula  Keri Lotion Fast Absorbing Softly Scented Dry Skin Formula  Keri Original Lotion  Keri Skin Renewal Lotion Keri Silky Smooth Lotion  Keri Silky Smooth Sensitive Skin Lotion  Nivea Body Creamy Conditioning Oil  Nivea Body Extra Enriched Lotion  Nivea Body Original Lotion  Nivea Body Sheer Moisturizing Lotion Nivea Crme  Nivea Skin Firming Lotion  NutraDerm 30 Skin Lotion  NutraDerm Skin Lotion  NutraDerm Therapeutic Skin Cream  NutraDerm Therapeutic Skin Lotion  ProShield Protective Hand Cream  Provon moisturizing lotion   FAILURE TO FOLLOW THESE INSTRUCTIONS MAY RESULT IN THE CANCELLATION OF YOUR SURGERY  PATIENT SIGNATURE_________________________________  NURSE SIGNATURE__________________________________  ________________________________________________________________________        Denise Austin    An incentive spirometer is a tool that can help keep your lungs clear and active. This tool measures how well you are filling your lungs with each breath.  Taking long deep breaths may help reverse or decrease the chance of developing breathing (pulmonary) problems (especially infection) following: A long period of time when you are unable to move or be active. BEFORE THE PROCEDURE  If the spirometer includes an indicator to show your best effort, your nurse or respiratory therapist will set it to a desired goal. If possible, sit up straight or lean slightly forward. Try not to slouch. Hold the incentive spirometer in an upright  position. INSTRUCTIONS FOR USE  Sit on the edge of your bed if possible, or sit up as far as you can in bed or on a chair. Hold the incentive spirometer in an upright position. Breathe out normally. Place the mouthpiece in your mouth and seal your lips tightly around it. Breathe in slowly and as deeply as possible, raising the piston or the ball toward the top of the column. Hold your breath for 3-5 seconds or for as long as possible. Allow the piston or ball to fall to the bottom of the column. Remove the mouthpiece from your mouth and breathe out normally. Rest for a few seconds and repeat Steps 1 through 7 at least 10 times every 1-2 hours when you are awake. Take your time and take a few normal breaths between deep breaths. The spirometer may include an indicator to show your best effort. Use the indicator as a goal to work toward during each repetition. After each set of 10 deep breaths, practice coughing to be sure your lungs are clear. If you have an incision (the cut made at the time of surgery), support your incision when coughing by placing a pillow or rolled up towels firmly against it. Once you are able to get out of bed, walk around indoors and cough well. You may stop using the incentive spirometer when instructed by your caregiver.  RISKS AND COMPLICATIONS Take your time so you do not get dizzy or light-headed. If you are in pain, you may need to take or ask for pain medication before doing incentive spirometry. It is harder to take a deep breath if you are having pain. AFTER USE Rest and breathe slowly and easily. It can be helpful to keep track of a log of your progress. Your caregiver can provide you with a simple table to help with this. If you are using the spirometer at home, follow these instructions: SEEK MEDICAL CARE IF:  You are having difficultly using the spirometer. You have trouble using the spirometer as often as instructed. Your pain medication is not giving  enough relief while using the spirometer. You develop fever of 100.5 F (38.1 C) or higher.                                                                                                    SEEK IMMEDIATE MEDICAL CARE IF:  You cough up bloody sputum that had not been present before. You develop fever of 102 F (38.9 C) or greater. You develop worsening pain at or near the incision site. MAKE SURE YOU:  Understand these instructions. Will watch your  condition. Will get help right away if you are not doing well or get worse. Document Released: 02/23/2007 Document Revised: 01/05/2012 Document Reviewed: 04/26/2007 [facility] Patient Information 2014 [place], [place].          WHAT IS A BLOOD TRANSFUSION? Blood Transfusion Information  A transfusion is the replacement of blood or some of its parts. Blood is made up of multiple cells which provide different functions. Red blood cells carry oxygen and are used for blood loss replacement. White blood cells fight against infection. Platelets control bleeding. Plasma helps clot blood. Other blood products are available for specialized needs, such as hemophilia or other clotting disorders. BEFORE THE TRANSFUSION  Who gives blood for transfusions?  Healthy volunteers who are fully evaluated to make sure their blood is safe. This is blood bank blood. Transfusion therapy is the safest it has ever been in the practice of medicine. Before blood is taken from a donor, a complete history is taken to make sure that person has no history of diseases nor engages in risky social behavior (examples are intravenous drug use or sexual activity with multiple partners). The donor's travel history is screened to minimize risk of transmitting infections, such as malaria. The donated blood is tested for signs of infectious diseases, such as HIV and hepatitis. The blood is then tested to be sure it is compatible with you in order to minimize the chance of a  transfusion reaction. If you or a relative donates blood, this is often done in anticipation of surgery and is not appropriate for emergency situations. It takes many days to process the donated blood. RISKS AND COMPLICATIONS Although transfusion therapy is very safe and saves many lives, the main dangers of transfusion include:  Getting an infectious disease. Developing a transfusion reaction. This is an allergic reaction to something in the blood you were given. Every precaution is taken to prevent this. The decision to have a blood transfusion has been considered carefully by your caregiver before blood is given. Blood is not given unless the benefits outweigh the risks. AFTER THE TRANSFUSION Right after receiving a blood transfusion, you will usually feel much better and more energetic. This is especially true if your red blood cells have gotten low (anemic). The transfusion raises the level of the red blood cells which carry oxygen, and this usually causes an energy increase. The nurse administering the transfusion will monitor you carefully for complications. HOME CARE INSTRUCTIONS  No special instructions are needed after a transfusion. You may find your energy is better. Speak with your caregiver about any limitations on activity for underlying diseases you may have. SEEK MEDICAL CARE IF:  Your condition is not improving after your transfusion. You develop redness or irritation at the intravenous (IV) site. SEEK IMMEDIATE MEDICAL CARE IF:  Any of the following symptoms occur over the next 12 hours: Shaking chills. You have a temperature by mouth above 102 F (38.9 C), not controlled by medicine. Chest, back, or muscle pain. People around you feel you are not acting correctly or are confused. Shortness of breath or difficulty breathing. Dizziness and fainting. You get a rash or develop hives. You have a decrease in urine output. Your urine turns a dark color or changes to pink, red,  or brown. Any of the following symptoms occur over the next 10 days: You have a temperature by mouth above 102 F (38.9 C), not controlled by medicine. Shortness of breath. Weakness after normal activity. The white part of the eye turns  yellow (jaundice). You have a decrease in the amount of urine or are urinating less often. Your urine turns a dark color or changes to pink, red, or brown. Document Released: 10/10/2000 Document Revised: 01/05/2012 Document Reviewed: 05/29/2008 [facility] Patient Information 2014 [place], [place].  _______________________________________________________________________        If you would like to see a video about joint replacement:   [email or web address]

## 2024-05-21 NOTE — Progress Notes (Signed)
 COVID Vaccine received:  [x]  No []  Yes Date of any COVID positive Test in last 90 days:  PCP - [name] Ponto, NP  Cardiologist -   Chest x-ray - 04-14-2017  2v  Epic EKG - 2023   will repeat at PST  Stress Test -  ECHO -  Cardiac Cath -  CT Coronary Calcium score:   Pacemaker / ICD device [x]  No []  Yes   Spinal Cord Stimulator:[x]  No []  Yes       History of Sleep Apnea? []  No [x]  Yes   CPAP used?- []  No [x]  Yes    Does the patient monitor blood sugar?   []  N/A   []  No []  Yes  Patient has: []  NO Hx DM   [x]  Pre-DM   []  DM1  []   DM2 Last A1c was:   6.1 on 05-06-24       Zepbound -  ?  Insurance not covering   Blood Thinner / Instructions: none Aspirin Instructions: none  ERAS Protocol Ordered: []  No  [x]  Yes PRE-SURGERY []  ENSURE  [x]  G2   Patient is to be NPO after: 0415  Dental hx: []  Dentures:  []  N/A      []  Bridge or Partial:                   []  Loose or Damaged teeth:   Comments: Patient was given the 5 CHG shower / bath instructions for THA surgery along with 2 bottles of the CHG soap. Patient will start this on:  05-30-24            Activity level: Able to walk up 2 flights of stairs without becoming significantly short of breath or having chest pain?  []  No   []    Yes   Anesthesia review: HTN, GERD, Pre- DM, OSA-CPAP, PONV  Patient denies any S&S of respiratory illness or Covid - no shortness of breath, fever, cough or chest pain at PAT appointment.  Patient verbalized understanding and agreement to the Pre-Surgical Instructions that were given to them at this PAT appointment. Patient was also educated of the need to review these PAT instructions again prior to his/her surgery.I reviewed the appropriate phone numbers to call if they have any and questions or concerns.

## 2024-05-23 ENCOUNTER — Encounter (HOSPITAL_COMMUNITY): Payer: Self-pay

## 2024-05-23 ENCOUNTER — Other Ambulatory Visit: Payer: Self-pay

## 2024-05-23 ENCOUNTER — Encounter (HOSPITAL_COMMUNITY)
Admission: RE | Admit: 2024-05-23 | Discharge: 2024-05-23 | Disposition: A | Discharge status: Home / Self Care | Source: Ambulatory Visit | Attending: Orthopaedic Surgery | Admitting: Orthopaedic Surgery

## 2024-05-23 VITALS — BP 144/92 | HR 62 | Temp 98.1°F | Resp 20 | Ht 65.0 in | Wt 243.0 lb

## 2024-05-23 DIAGNOSIS — R9431 Abnormal electrocardiogram [ECG] [EKG]: Secondary | ICD-10-CM | POA: Insufficient documentation

## 2024-05-23 DIAGNOSIS — Z01818 Encounter for other preprocedural examination: Secondary | ICD-10-CM | POA: Diagnosis not present

## 2024-05-23 DIAGNOSIS — R7303 Prediabetes: Secondary | ICD-10-CM | POA: Insufficient documentation

## 2024-05-23 DIAGNOSIS — I10 Essential (primary) hypertension: Secondary | ICD-10-CM | POA: Diagnosis not present

## 2024-05-23 DIAGNOSIS — M1611 Unilateral primary osteoarthritis, right hip: Secondary | ICD-10-CM | POA: Diagnosis not present

## 2024-05-23 HISTORY — DX: Prediabetes: R73.03

## 2024-05-23 HISTORY — DX: Unspecified osteoarthritis, unspecified site: M19.90

## 2024-05-23 LAB — BASIC METABOLIC PANEL WITH GFR
Anion gap: 6 (ref 5–15)
BUN: 24 mg/dL — ABNORMAL HIGH (ref 6–20)
CO2: 27 mmol/L (ref 22–32)
Calcium: 8.9 mg/dL (ref 8.9–10.3)
Chloride: 109 mmol/L (ref 98–111)
Creatinine, Ser: 0.64 mg/dL (ref 0.44–1.00)
GFR, Estimated: >60 mL/min (ref >60)
Glucose, Bld: 116 mg/dL — ABNORMAL HIGH (ref 70–99)
Potassium: 4.1 mmol/L (ref 3.5–5.1)
Sodium: 142 mmol/L (ref 135–145)

## 2024-05-23 LAB — SURGICAL PCR SCREEN
MRSA, PCR: NEGATIVE
Staphylococcus aureus: NEGATIVE

## 2024-05-23 LAB — CBC
HCT: 39.4 % (ref 36.0–46.0)
Hemoglobin: 12.7 g/dL (ref 12.0–15.0)
MCH: 30.5 pg (ref 26.0–34.0)
MCHC: 32.2 g/dL (ref 30.0–36.0)
MCV: 94.7 fL (ref 80.0–100.0)
Platelets: 272 K/uL (ref 150–400)
RBC: 4.16 MIL/uL (ref 3.87–5.11)
RDW: 12.7 % (ref 11.5–15.5)
WBC: 7.2 K/uL (ref 4.0–10.5)
nRBC: 0 % (ref 0.0–0.2)

## 2024-05-23 LAB — TYPE AND SCREEN
ABO/RH(D): B POS
Antibody Screen: NEGATIVE

## 2024-05-31 ENCOUNTER — Other Ambulatory Visit (HOSPITAL_COMMUNITY): Payer: Self-pay

## 2024-06-01 ENCOUNTER — Encounter: Admitting: Family

## 2024-06-02 ENCOUNTER — Other Ambulatory Visit: Payer: Self-pay | Admitting: Orthopaedic Surgery

## 2024-06-02 ENCOUNTER — Telehealth: Payer: Self-pay

## 2024-06-02 ENCOUNTER — Encounter (HOSPITAL_COMMUNITY): Payer: Self-pay | Admitting: Orthopaedic Surgery

## 2024-06-02 ENCOUNTER — Other Ambulatory Visit (HOSPITAL_COMMUNITY): Payer: Self-pay

## 2024-06-02 MED ORDER — OXYCODONE HCL 5 MG PO TABS
5.0000 mg | ORAL_TABLET | ORAL | 0 refills | Status: DC | PRN
  Filled 2024-06-02: qty 30, 3d supply, fill #0

## 2024-06-02 MED ORDER — TIZANIDINE HCL 4 MG PO TABS
4.0000 mg | ORAL_TABLET | Freq: Four times a day (QID) | ORAL | 0 refills | Status: AC | PRN
  Filled 2024-06-02: qty 30, 8d supply, fill #0

## 2024-06-02 NOTE — Telephone Encounter (Signed)
 Can we go ahead and send in post operative medication for patient to her [facility] outpatient pharmacy since they are closed on the weekends?

## 2024-06-02 NOTE — Anesthesia Preprocedure Evaluation (Addendum)
 Anesthesia Evaluation  Patient identified by MRN, date of birth, ID band Patient awake    Reviewed: Allergy & Precautions, NPO status , Patient's Chart, lab work & pertinent test results, reviewed documented beta blocker date and time   History of Anesthesia Complications (+) PONV and history of anesthetic complications  Airway Mallampati: II  TM Distance: >3 FB     Dental  (+) Missing, Dental Advisory Given   Pulmonary sleep apnea and Continuous Positive Airway Pressure Ventilation    Pulmonary exam normal breath sounds clear to auscultation       Cardiovascular hypertension, Pt. on medications and Pt. on home beta blockers Normal cardiovascular exam Rhythm:Regular Rate:Normal     Neuro/Psych  Headaches  negative psych ROS   GI/Hepatic ,GERD  Medicated,,  Endo/Other    Class 3 obesityPre diabetes HbA1c - 6.1 GLP-1 RA therapy- has not started, recently prescribed  Renal/GU   negative genitourinary   Musculoskeletal  (+) Arthritis , Osteoarthritis,  OA right hip Hx/o Lumbar laminectomy   Abdominal  (+) + obese  Peds  Hematology   Anesthesia Other Findings   Reproductive/Obstetrics HSV                              Anesthesia Physical Anesthesia Plan  ASA: 3  Anesthesia Plan: Spinal   Post-op Pain Management: Tylenol  PO (pre-op)*, Precedex and Dilaudid  IV   Induction: Intravenous  PONV Risk Score and Plan: 4 or greater and Treatment may vary due to age or medical condition, Midazolam , Propofol  infusion and Ondansetron   Airway Management Planned: Natural Airway and Simple Face Mask  Additional Equipment: None  Intra-op Plan:   Post-operative Plan: Extubation in OR  Informed Consent: I have reviewed the patients History and Physical, chart, labs and discussed the procedure including the risks, benefits and alternatives for the proposed anesthesia with the patient or authorized  representative who has indicated his/her understanding and acceptance.     Dental advisory given  Plan Discussed with: CRNA and Anesthesiologist  Anesthesia Plan Comments:          Anesthesia Quick Evaluation

## 2024-06-02 NOTE — H&P (Signed)
 TOTAL HIP ADMISSION H&P  Patient is admitted for right total hip arthroplasty.  Subjective:  Chief Complaint: right hip pain  HPI: Denise Austin, 53 y.o. female, has a history of pain and functional disability in the right hip(s) due to arthritis and patient has failed non-surgical conservative treatments for greater than 12 weeks to include NSAID's and/or analgesics, corticosteriod injections, flexibility and strengthening excercises, weight reduction as appropriate, and activity modification.  Onset of symptoms was gradual starting about a year ago with gradually worsening course since that time.The patient noted no past surgery on the right hip(s).  Patient currently rates pain in the right hip at 10 out of 10 with activity. Patient has night pain, worsening of pain with activity and weight bearing, pain that interfers with activities of daily living, and pain with passive range of motion. Patient has evidence of subchondral sclerosis, periarticular osteophytes, joint space narrowing, and labral tearing per MRI by imaging studies. This condition presents safety issues increasing the risk of falls. This patient has had avascular necrosis of the hip, acetabular fracture, hip dysplasia.  There is no current active infection.  Patient Active Problem List   Diagnosis Date Noted   Unilateral primary osteoarthritis, right hip 02/22/2024   Migraine without status migrainosus, not intractable 05/06/2023   Symptomatic mammary hypertrophy 12/02/2021   Neck pain 12/02/2021   Morbid obesity due to excess calories (HCC) 04/21/2019   OSA (obstructive sleep apnea) 04/15/2018   Herpes simplex viral infection 07/10/2017   S/P lumbar laminectomy 04/22/2017   GERD (gastroesophageal reflux disease) 07/25/2016   Prediabetes 07/25/2016   HTN (hypertension) 12/24/2015   Preventative health care 10/16/2014   CMC arthritis 07/06/2014   S/P excision of ganglion cyst 07/06/2014   Synovitis 07/06/2014   Past  Medical History:  Diagnosis Date   Allergy    Arthritis    GERD (gastroesophageal reflux disease)    Hyperglycemia    Hypertension    OSA (obstructive sleep apnea) 04/15/2018   PONV (postoperative nausea and vomiting)    Pre-diabetes    Sleep apnea    wears cpap    Uterine fibroid     Past Surgical History:  Procedure Laterality Date   BUNIONECTOMY     Lt-01/07/06, Rt-09/15/07   CHOLECYSTECTOMY     laparoscopic   CYST REMOVAL HAND     Left Wrist   HERNIA REPAIR  1976   umbilical   LUMBAR LAMINECTOMY/DECOMPRESSION MICRODISCECTOMY Right 04/22/2017   Procedure: Extraforaminal Microdiscectomy - Lumbar two-Lumbar three - right;  Surgeon: Joshua Alm RAMAN, MD;  Location: [facility] OR;  Service: Neurosurgery;  Laterality: Right;   lumbar surgery revision  04/24/2017   WOUND EXPLORATION N/A 05/22/2017   Procedure: Revision Of Lumbar Wound;  Surgeon: Joshua Alm RAMAN, MD;  Location: [facility] OR;  Service: Neurosurgery;  Laterality: N/A;  Lumbar wound revision    No current facility-administered medications for this encounter.   Current Outpatient Medications  Medication Sig Dispense Refill Last Dose/Taking   BIOTIN PO Take 1 tablet by mouth daily. Gummy vitamin   Taking   gabapentin  (NEURONTIN ) 300 MG capsule Take 1 capsule (300 mg total) by mouth 2 (two) times daily. (Patient taking differently: Take 300 mg by mouth 2 (two) times daily as needed (nerve pain).) 60 capsule 2 Taking Differently   losartan  (COZAAR ) 50 MG tablet Take 1 tablet (50 mg total) by mouth daily. 90 tablet 1 Taking   metoprolol  succinate (TOPROL -XL) 100 MG 24 hr tablet Take 1 tablet (100 mg total)  by mouth daily. Take with or immediately following a meal. 90 tablet 1 Taking   omeprazole  (PRILOSEC) 20 MG capsule Take 1 capsule (20 mg total) by mouth daily. 90 capsule 1 Taking   Probiotic Product (ALIGN) 10 MG CAPS Take 1 capsule by mouth daily.   Taking   SUMAtriptan  (IMITREX ) 50 MG tablet Take 1 tablet (50 mg total) by mouth every  2 (two) hours as needed for migraine. May repeat in 2 hours if headache persists or recurs. 10 tablet 2 Taking As Needed   valACYclovir  (VALTREX ) 500 MG tablet Take 1 tablet (500 mg total) by mouth daily. 90 tablet 1 Taking   ibuprofen  (ADVIL ) 800 MG tablet Take 1 tablet (800 mg total) by mouth every 8 (eight) hours as needed. (Patient not taking: Reported on 05/18/2024) 30 tablet 0 Not Taking   tirzepatide  (ZEPBOUND ) 2.5 MG/0.5ML Pen Inject 2.5 mg into the skin once a week. 2 mL 0    No Known Allergies  Social History   Tobacco Use   Smoking status: Never   Smokeless tobacco: Never  Substance Use Topics   Alcohol use: Yes    Alcohol/week: 0.0 standard drinks of alcohol    Comment: occasional 1-2 times a month    Family History  Problem Relation Age of Onset   Hypertension Mother    Diabetes Mother    Sleep apnea Mother    Obesity Mother    Stroke Father    Hypertension Father        smoker   COPD Father    Single kidney Sister        removed as a child   Heart attack Neg Hx    Hyperlipidemia Neg Hx    Sudden death Neg Hx    Colon cancer Neg Hx    Colon polyps Neg Hx    Esophageal cancer Neg Hx    Stomach cancer Neg Hx    Rectal cancer Neg Hx      Review of Systems  Objective:  Physical Exam Vitals reviewed.  Constitutional:      Appearance: Normal appearance. She is obese.  HENT:     Head: Normocephalic and atraumatic.  Eyes:     Extraocular Movements: Extraocular movements intact.     Pupils: Pupils are equal, round, and reactive to light.  Cardiovascular:     Rate and Rhythm: Normal rate and regular rhythm.  Pulmonary:     Effort: Pulmonary effort is normal.     Breath sounds: Normal breath sounds.  Abdominal:     Palpations: Abdomen is soft.  Musculoskeletal:     Cervical back: Normal range of motion and neck supple.     Right hip: Tenderness and bony tenderness present. Decreased range of motion. Decreased strength.  Neurological:     Mental Status:  She is alert and oriented to person, place, and time.  Psychiatric:        Behavior: Behavior normal.     Vital signs in last 24 hours:    Labs:   Estimated body mass index is 40.44 kg/m as calculated from the following:   Height as of 05/23/24: 5' 5 (1.651 m).   Weight as of 05/23/24: 110.2 kg.   Imaging Review Plain radiographs and MRI demonstrate severe degenerative joint disease of the right hip(s). The bone quality appears to be excellent for age and reported activity level.      Assessment/Plan:  End stage arthritis, right hip(s)  The patient history, physical examination, clinical  judgement of the provider and imaging studies are consistent with end stage degenerative joint disease of the right hip(s) and total hip arthroplasty is deemed medically necessary. The treatment options including medical management, injection therapy, arthroscopy and arthroplasty were discussed at length. The risks and benefits of total hip arthroplasty were presented and reviewed. The risks due to aseptic loosening, infection, stiffness, dislocation/subluxation,  thromboembolic complications and other imponderables were discussed.  The patient acknowledged the explanation, agreed to proceed with the plan and consent was signed. Patient is being admitted for inpatient treatment for surgery, pain control, PT, OT, prophylactic antibiotics, VTE prophylaxis, progressive ambulation and ADL's and discharge planning.The patient is planning to be discharged home with home health services

## 2024-06-03 ENCOUNTER — Encounter (HOSPITAL_COMMUNITY)
Admission: RE | Disposition: A | Discharge status: Home / Self Care | Payer: Self-pay | Source: Home / Self Care | Attending: Orthopaedic Surgery

## 2024-06-03 ENCOUNTER — Encounter (HOSPITAL_COMMUNITY): Payer: Self-pay | Admitting: Orthopaedic Surgery

## 2024-06-03 ENCOUNTER — Ambulatory Visit (HOSPITAL_COMMUNITY): Payer: Self-pay | Admitting: Medical

## 2024-06-03 ENCOUNTER — Observation Stay (HOSPITAL_COMMUNITY)
Admission: RE | Admit: 2024-06-03 | Discharge: 2024-06-05 | Disposition: A | Discharge status: Home / Self Care | Attending: Orthopaedic Surgery | Admitting: Orthopaedic Surgery

## 2024-06-03 ENCOUNTER — Ambulatory Visit (HOSPITAL_COMMUNITY)

## 2024-06-03 ENCOUNTER — Observation Stay (HOSPITAL_COMMUNITY)

## 2024-06-03 ENCOUNTER — Other Ambulatory Visit: Payer: Self-pay

## 2024-06-03 ENCOUNTER — Ambulatory Visit (HOSPITAL_COMMUNITY): Payer: Self-pay

## 2024-06-03 DIAGNOSIS — Z7982 Long term (current) use of aspirin: Secondary | ICD-10-CM | POA: Diagnosis not present

## 2024-06-03 DIAGNOSIS — I219 Acute myocardial infarction, unspecified: Secondary | ICD-10-CM | POA: Diagnosis not present

## 2024-06-03 DIAGNOSIS — I10 Essential (primary) hypertension: Secondary | ICD-10-CM | POA: Diagnosis not present

## 2024-06-03 DIAGNOSIS — E66813 Obesity, class 3: Secondary | ICD-10-CM | POA: Diagnosis not present

## 2024-06-03 DIAGNOSIS — J449 Chronic obstructive pulmonary disease, unspecified: Secondary | ICD-10-CM | POA: Insufficient documentation

## 2024-06-03 DIAGNOSIS — E119 Type 2 diabetes mellitus without complications: Secondary | ICD-10-CM | POA: Insufficient documentation

## 2024-06-03 DIAGNOSIS — M1611 Unilateral primary osteoarthritis, right hip: Principal | ICD-10-CM | POA: Diagnosis present

## 2024-06-03 DIAGNOSIS — F1090 Alcohol use, unspecified, uncomplicated: Secondary | ICD-10-CM | POA: Insufficient documentation

## 2024-06-03 DIAGNOSIS — Z6841 Body Mass Index (BMI) 40.0 and over, adult: Secondary | ICD-10-CM

## 2024-06-03 DIAGNOSIS — Z96651 Presence of right artificial knee joint: Secondary | ICD-10-CM | POA: Diagnosis not present

## 2024-06-03 DIAGNOSIS — Z471 Aftercare following joint replacement surgery: Secondary | ICD-10-CM | POA: Diagnosis not present

## 2024-06-03 DIAGNOSIS — G4733 Obstructive sleep apnea (adult) (pediatric): Secondary | ICD-10-CM | POA: Diagnosis not present

## 2024-06-03 DIAGNOSIS — Z96641 Presence of right artificial hip joint: Secondary | ICD-10-CM | POA: Diagnosis not present

## 2024-06-03 HISTORY — PX: TOTAL HIP ARTHROPLASTY: SHX124

## 2024-06-03 LAB — ABO/RH: ABO/RH(D): B POS

## 2024-06-03 SURGERY — ARTHROPLASTY, HIP, TOTAL, ANTERIOR APPROACH
Anesthesia: Spinal | Site: Hip | Laterality: Right

## 2024-06-03 MED ORDER — DROPERIDOL 2.5 MG/ML IJ SOLN: 0.6250 mg | Freq: Once | INTRAMUSCULAR | Status: DC | PRN

## 2024-06-03 MED ORDER — 0.9 % SODIUM CHLORIDE (POUR BTL) OPTIME
TOPICAL | Status: DC | PRN
  Administered 2024-06-03: 1000 mL

## 2024-06-03 MED ORDER — PROPOFOL 500 MG/50ML IV EMUL
INTRAVENOUS | Status: DC | PRN
  Administered 2024-06-03: 30 ug/kg/min via INTRAVENOUS

## 2024-06-03 MED ORDER — HYDROMORPHONE HCL 1 MG/ML IJ SOLN
0.2500 mg | INTRAMUSCULAR | Status: DC | PRN
  Administered 2024-06-03: 0.25 mg via INTRAVENOUS

## 2024-06-03 MED ORDER — CEFAZOLIN SODIUM-DEXTROSE 2-4 GM/100ML-% IV SOLN
2.0000 g | INTRAVENOUS | Status: AC
  Administered 2024-06-03: 2 g via INTRAVENOUS
  Filled 2024-06-03: qty 100

## 2024-06-03 MED ORDER — METOCLOPRAMIDE HCL 5 MG/ML IJ SOLN
5.0000 mg | Freq: Three times a day (TID) | INTRAMUSCULAR | Status: DC | PRN
  Administered 2024-06-03 – 2024-06-04 (×2): 10 mg via INTRAVENOUS
  Filled 2024-06-03 (×2): qty 2

## 2024-06-03 MED ORDER — PHENOL 1.4 % MT LIQD: 1.0000 | OROMUCOSAL | Status: DC | PRN

## 2024-06-03 MED ORDER — SODIUM CHLORIDE 0.9 % IV SOLN: INTRAVENOUS | Status: DC

## 2024-06-03 MED ORDER — GABAPENTIN 300 MG PO CAPS: 300.0000 mg | ORAL_CAPSULE | Freq: Two times a day (BID) | ORAL | Status: DC | PRN

## 2024-06-03 MED ORDER — FENTANYL CITRATE (PF) 100 MCG/2ML IJ SOLN
INTRAMUSCULAR | Status: DC | PRN
  Administered 2024-06-03: 50 ug via INTRAVENOUS
  Administered 2024-06-03: 25 ug via INTRAVENOUS

## 2024-06-03 MED ORDER — ACETAMINOPHEN 325 MG PO TABS: 325.0000 mg | ORAL_TABLET | Freq: Four times a day (QID) | ORAL | Status: DC | PRN

## 2024-06-03 MED ORDER — ASPIRIN 81 MG PO CHEW
81.0000 mg | CHEWABLE_TABLET | Freq: Two times a day (BID) | ORAL | Status: DC
  Administered 2024-06-03 – 2024-06-05 (×4): 81 mg via ORAL
  Filled 2024-06-03 (×4): qty 1

## 2024-06-03 MED ORDER — PROPOFOL 1000 MG/100ML IV EMUL
INTRAVENOUS | Status: AC
  Filled 2024-06-03: qty 100

## 2024-06-03 MED ORDER — TRANEXAMIC ACID-NACL 1000-0.7 MG/100ML-% IV SOLN
1000.0000 mg | INTRAVENOUS | Status: AC
  Administered 2024-06-03: 1000 mg via INTRAVENOUS
  Filled 2024-06-03: qty 100

## 2024-06-03 MED ORDER — ORAL CARE MOUTH RINSE: 15.0000 mL | OROMUCOSAL | Status: DC | PRN

## 2024-06-03 MED ORDER — ORAL CARE MOUTH RINSE: 15.0000 mL | Freq: Once | OROMUCOSAL | Status: AC

## 2024-06-03 MED ORDER — OXYCODONE HCL 5 MG PO TABS
5.0000 mg | ORAL_TABLET | ORAL | Status: DC | PRN
  Administered 2024-06-03 (×2): 5 mg via ORAL
  Administered 2024-06-03 – 2024-06-04 (×3): 10 mg via ORAL
  Administered 2024-06-05: 5 mg via ORAL
  Administered 2024-06-05 (×2): 10 mg via ORAL
  Filled 2024-06-03: qty 1
  Filled 2024-06-03 (×2): qty 2
  Filled 2024-06-03: qty 1
  Filled 2024-06-03 (×6): qty 2

## 2024-06-03 MED ORDER — EPHEDRINE SULFATE-NACL 50-0.9 MG/10ML-% IV SOSY
PREFILLED_SYRINGE | INTRAVENOUS | Status: DC | PRN
  Administered 2024-06-03: 5 mg via INTRAVENOUS

## 2024-06-03 MED ORDER — TIZANIDINE HCL 4 MG PO TABS
4.0000 mg | ORAL_TABLET | Freq: Four times a day (QID) | ORAL | Status: DC | PRN
  Administered 2024-06-03 – 2024-06-04 (×5): 4 mg via ORAL
  Filled 2024-06-03 (×5): qty 1

## 2024-06-03 MED ORDER — METOPROLOL SUCCINATE ER 50 MG PO TB24
100.0000 mg | ORAL_TABLET | Freq: Every day | ORAL | Status: DC
  Administered 2024-06-04 – 2024-06-05 (×2): 100 mg via ORAL
  Filled 2024-06-03 (×2): qty 2

## 2024-06-03 MED ORDER — POVIDONE-IODINE 10 % EX SWAB: 2.0000 | Freq: Once | CUTANEOUS | Status: DC

## 2024-06-03 MED ORDER — ONDANSETRON HCL 4 MG/2ML IJ SOLN
INTRAMUSCULAR | Status: AC
  Filled 2024-06-03: qty 2

## 2024-06-03 MED ORDER — ONDANSETRON HCL 4 MG/2ML IJ SOLN
INTRAMUSCULAR | Status: DC | PRN
  Administered 2024-06-03: 4 mg via INTRAVENOUS

## 2024-06-03 MED ORDER — LACTATED RINGERS IV SOLN: INTRAVENOUS | Status: DC

## 2024-06-03 MED ORDER — MIDAZOLAM HCL 5 MG/5ML IJ SOLN
INTRAMUSCULAR | Status: DC | PRN
  Administered 2024-06-03 (×2): 1 mg via INTRAVENOUS

## 2024-06-03 MED ORDER — DEXAMETHASONE SODIUM PHOSPHATE 10 MG/ML IJ SOLN
INTRAMUSCULAR | Status: AC
  Filled 2024-06-03: qty 1

## 2024-06-03 MED ORDER — LIDOCAINE HCL (PF) 2 % IJ SOLN
INTRAMUSCULAR | Status: AC
  Filled 2024-06-03: qty 5

## 2024-06-03 MED ORDER — DEXAMETHASONE SODIUM PHOSPHATE 10 MG/ML IJ SOLN
INTRAMUSCULAR | Status: DC | PRN
Start: 2024-06-03 — End: 2024-06-03
  Administered 2024-06-03: 4 mg via INTRAVENOUS

## 2024-06-03 MED ORDER — LIDOCAINE HCL (CARDIAC) PF 100 MG/5ML IV SOSY
PREFILLED_SYRINGE | INTRAVENOUS | Status: DC | PRN
  Administered 2024-06-03: 30 mg via INTRAVENOUS

## 2024-06-03 MED ORDER — METOCLOPRAMIDE HCL 5 MG PO TABS: 5.0000 mg | ORAL_TABLET | Freq: Three times a day (TID) | ORAL | Status: DC | PRN

## 2024-06-03 MED ORDER — ONDANSETRON HCL 4 MG PO TABS: 4.0000 mg | ORAL_TABLET | Freq: Four times a day (QID) | ORAL | Status: DC | PRN

## 2024-06-03 MED ORDER — PANTOPRAZOLE SODIUM 40 MG PO TBEC
40.0000 mg | DELAYED_RELEASE_TABLET | Freq: Every day | ORAL | Status: DC
  Administered 2024-06-03 – 2024-06-05 (×3): 40 mg via ORAL
  Filled 2024-06-03 (×3): qty 1

## 2024-06-03 MED ORDER — ONDANSETRON HCL 4 MG/2ML IJ SOLN
4.0000 mg | Freq: Four times a day (QID) | INTRAMUSCULAR | Status: DC | PRN
  Administered 2024-06-03 – 2024-06-04 (×2): 4 mg via INTRAVENOUS
  Filled 2024-06-03 (×2): qty 2

## 2024-06-03 MED ORDER — HYDROMORPHONE HCL 1 MG/ML IJ SOLN
INTRAMUSCULAR | Status: AC
  Filled 2024-06-03: qty 1

## 2024-06-03 MED ORDER — LOSARTAN POTASSIUM 50 MG PO TABS
50.0000 mg | ORAL_TABLET | Freq: Every day | ORAL | Status: DC
  Administered 2024-06-03 – 2024-06-05 (×3): 50 mg via ORAL
  Filled 2024-06-03 (×3): qty 1

## 2024-06-03 MED ORDER — FENTANYL CITRATE (PF) 100 MCG/2ML IJ SOLN
INTRAMUSCULAR | Status: AC
  Filled 2024-06-03: qty 2

## 2024-06-03 MED ORDER — CHLORHEXIDINE GLUCONATE 0.12 % MT SOLN
15.0000 mL | Freq: Once | OROMUCOSAL | Status: AC
  Administered 2024-06-03: 15 mL via OROMUCOSAL

## 2024-06-03 MED ORDER — OXYCODONE HCL 5 MG PO TABS: 5.0000 mg | ORAL_TABLET | Freq: Once | ORAL | Status: DC | PRN

## 2024-06-03 MED ORDER — ONDANSETRON HCL 4 MG/2ML IJ SOLN: 4.0000 mg | Freq: Once | INTRAMUSCULAR | Status: DC | PRN

## 2024-06-03 MED ORDER — OXYCODONE HCL 5 MG PO TABS
10.0000 mg | ORAL_TABLET | ORAL | Status: DC | PRN
  Administered 2024-06-03 – 2024-06-04 (×3): 10 mg via ORAL
  Filled 2024-06-03 (×2): qty 2

## 2024-06-03 MED ORDER — KETAMINE HCL 50 MG/5ML IJ SOSY
PREFILLED_SYRINGE | INTRAMUSCULAR | Status: AC
  Filled 2024-06-03: qty 5

## 2024-06-03 MED ORDER — SODIUM CHLORIDE 0.9 % IR SOLN
Status: DC | PRN
  Administered 2024-06-03: 1000 mL

## 2024-06-03 MED ORDER — OXYCODONE HCL 5 MG/5ML PO SOLN: 5.0000 mg | Freq: Once | ORAL | Status: DC | PRN

## 2024-06-03 MED ORDER — EPHEDRINE 5 MG/ML INJ
INTRAVENOUS | Status: AC
  Filled 2024-06-03: qty 5

## 2024-06-03 MED ORDER — CEFAZOLIN SODIUM-DEXTROSE 2-4 GM/100ML-% IV SOLN
2.0000 g | Freq: Four times a day (QID) | INTRAVENOUS | Status: AC
  Administered 2024-06-03 (×2): 2 g via INTRAVENOUS
  Filled 2024-06-03 (×2): qty 100

## 2024-06-03 MED ORDER — PHENYLEPHRINE HCL-NACL 20-0.9 MG/250ML-% IV SOLN
INTRAVENOUS | Status: AC
Start: 2024-06-03 — End: 2024-06-03
  Filled 2024-06-03: qty 250

## 2024-06-03 MED ORDER — HYDROMORPHONE HCL 1 MG/ML IJ SOLN: 0.5000 mg | INTRAMUSCULAR | Status: DC | PRN

## 2024-06-03 MED ORDER — KETAMINE HCL 10 MG/ML IJ SOLN
INTRAMUSCULAR | Status: DC | PRN
  Administered 2024-06-03: 20 mg via INTRAVENOUS

## 2024-06-03 MED ORDER — MIDAZOLAM HCL 2 MG/2ML IJ SOLN
INTRAMUSCULAR | Status: AC
Start: 2024-06-03 — End: 2024-06-03
  Filled 2024-06-03: qty 2

## 2024-06-03 MED ORDER — DOCUSATE SODIUM 100 MG PO CAPS
100.0000 mg | ORAL_CAPSULE | Freq: Two times a day (BID) | ORAL | Status: DC
  Administered 2024-06-03 – 2024-06-05 (×4): 100 mg via ORAL
  Filled 2024-06-03 (×4): qty 1

## 2024-06-03 MED ORDER — DIPHENHYDRAMINE HCL 12.5 MG/5ML PO ELIX: 12.5000 mg | ORAL_SOLUTION | ORAL | Status: DC | PRN

## 2024-06-03 MED ORDER — PHENYLEPHRINE HCL-NACL 20-0.9 MG/250ML-% IV SOLN
INTRAVENOUS | Status: DC | PRN
  Administered 2024-06-03: 30 ug/min via INTRAVENOUS

## 2024-06-03 MED ORDER — MENTHOL 3 MG MT LOZG: 1.0000 | LOZENGE | OROMUCOSAL | Status: DC | PRN

## 2024-06-03 MED ORDER — ALUM & MAG HYDROXIDE-SIMETH 200-200-20 MG/5ML PO SUSP
30.0000 mL | ORAL | Status: DC | PRN
Start: 2024-06-03 — End: 2024-06-05

## 2024-06-03 SURGICAL SUPPLY — 36 items
BAG COUNTER SPONGE SURGICOUNT (BAG) ×1 IMPLANT
BAG ZIPLOCK 12X15 (MISCELLANEOUS) IMPLANT
BENZOIN TINCTURE PRP APPL 2/3 (GAUZE/BANDAGES/DRESSINGS) IMPLANT
BLADE SAW SGTL 18X1.27X75 (BLADE) ×1 IMPLANT
COVER PERINEAL POST (MISCELLANEOUS) ×1 IMPLANT
COVER SURGICAL LIGHT HANDLE (MISCELLANEOUS) ×1 IMPLANT
DRAPE FOOT SWITCH (DRAPES) ×1 IMPLANT
DRAPE STERI IOBAN 125X83 (DRAPES) ×1 IMPLANT
DRAPE U-SHAPE 47X51 STRL (DRAPES) ×2 IMPLANT
DRSG AQUACEL AG ADV 3.5X10 (GAUZE/BANDAGES/DRESSINGS) ×1 IMPLANT
DURAPREP 26ML APPLICATOR (WOUND CARE) ×1 IMPLANT
ELECT PENCIL ROCKER SW 15FT (MISCELLANEOUS) ×1 IMPLANT
ELECT REM PT RETURN 15FT ADLT (MISCELLANEOUS) ×1 IMPLANT
FEMORAL STEM 12/14 TPR SZ4 HIP (Orthopedic Implant) IMPLANT
GAUZE XEROFORM 1X8 LF (GAUZE/BANDAGES/DRESSINGS) IMPLANT
GLOVE BIO SURGEON STRL SZ7.5 (GLOVE) ×1 IMPLANT
GLOVE BIOGEL PI IND STRL 8 (GLOVE) ×2 IMPLANT
GLOVE ECLIPSE 8.0 STRL XLNG CF (GLOVE) ×1 IMPLANT
GOWN STRL REUS W/ TWL XL LVL3 (GOWN DISPOSABLE) ×2 IMPLANT
HEAD CERAMIC 36 PLUS5 (Hips) IMPLANT
HOLDER FOLEY CATH W/STRAP (MISCELLANEOUS) ×1 IMPLANT
KIT TURNOVER KIT A (KITS) ×1 IMPLANT
LINER NEUTRAL 52X36MM PLUS 4 (Liner) IMPLANT
PACK ANTERIOR HIP CUSTOM (KITS) ×1 IMPLANT
PIN SECTOR W/GRIP ACE CUP 52MM (Hips) IMPLANT
SET HNDPC FAN SPRY TIP SCT (DISPOSABLE) ×1 IMPLANT
STAPLER SKIN PROX 35W (STAPLE) IMPLANT
STRIP CLOSURE SKIN 1/2X4 (GAUZE/BANDAGES/DRESSINGS) IMPLANT
SUT ETHIBOND NAB CT1 #1 30IN (SUTURE) ×1 IMPLANT
SUT ETHILON 2 0 PS N (SUTURE) IMPLANT
SUT MNCRL AB 4-0 PS2 18 (SUTURE) IMPLANT
SUT VIC AB 0 CT1 36 (SUTURE) ×1 IMPLANT
SUT VIC AB 1 CT1 36 (SUTURE) ×1 IMPLANT
SUT VIC AB 2-0 CT1 TAPERPNT 27 (SUTURE) ×2 IMPLANT
TRAY FOLEY MTR SLVR 16FR STAT (SET/KITS/TRAYS/PACK) IMPLANT
YANKAUER SUCT BULB TIP NO VENT (SUCTIONS) ×1 IMPLANT

## 2024-06-03 NOTE — Interval H&P Note (Signed)
 History and Physical Interval Note: The patient understands that she is here today for a right total hip replacement surgery significant right hip pain and arthritis.  There has been no acute or interval change in her medical status.  The risks and benefits of surgery have been discussed in detail and informed consent has been obtained.  The right operative hip has been marked.  06/03/2024 6:57 AM  Denise Austin  has presented today for surgery, with the diagnosis of osteoarthritis right hip.  The various methods of treatment have been discussed with the patient and family. After consideration of risks, benefits and other options for treatment, the patient has consented to  Procedure(s): ARTHROPLASTY, HIP, TOTAL, ANTERIOR APPROACH (Right) as a surgical intervention.  The patient's history has been reviewed, patient examined, no change in status, stable for surgery.  I have reviewed the patient's chart and labs.  Questions were answered to the patient's satisfaction.     Denise Austin

## 2024-06-03 NOTE — TOC Transition Note (Signed)
 Transition of Care [facility]) - Discharge Note   Patient Details  Name: Denise Austin MRN: [id number] Date of Birth: 1971/08/04  [facility] CM/SW Contact:  NORMAN ASPEN, LCSW Phone Number: 06/03/2024, 3:15 PM   Clinical Narrative:     Met with pt who confirms she has needed DME in the home.  Plan for HEP and will discuss in follow up appointments with MD if OP needs to be arranged.  No further TOC needs.  Final next level of care: Home/Self Care Barriers to Discharge: No Barriers Identified   Patient Goals and CMS Choice Patient states their goals for this hospitalization and ongoing recovery are:: return home          Discharge Placement                       Discharge Plan and Services Additional resources added to the After Visit Summary for                  DME Arranged: N/A DME Agency: NA                  Social Drivers of Health (SDOH) Interventions SDOH Screenings   Food Insecurity: No Food Insecurity (06/03/2024)  Housing: Low Risk  (06/03/2024)  Transportation Needs: No Transportation Needs (06/03/2024)  Utilities: Not At Risk (06/03/2024)  Alcohol Screen: Low Risk  (05/06/2024)  Depression (PHQ2-9): Low Risk  (05/06/2024)  Financial Resource Strain: Low Risk  (05/06/2024)  Physical Activity: Insufficiently Active (05/06/2024)  Social Connections: Unknown (05/06/2024)  Stress: No Stress Concern Present (05/06/2024)  Tobacco Use: Low Risk  (06/03/2024)     Readmission Risk Interventions     No data to display

## 2024-06-03 NOTE — Transfer of Care (Signed)
 Immediate Anesthesia Transfer of Care Note  Patient: Denise Austin  Procedure(s) Performed: ARTHROPLASTY, HIP, TOTAL, ANTERIOR APPROACH (Right: Hip)  Patient Location: PACU  Anesthesia Type:Spinal  Level of Consciousness: awake, alert , oriented, and patient cooperative  Airway & Oxygen Therapy: Patient Spontanous Breathing and Patient connected to face mask oxygen  Post-op Assessment: Report given to RN and Post -op Vital signs reviewed and stable  Post vital signs: Reviewed and stable  Last Vitals:  Vitals Value Taken Time  BP 117/63 06/03/24 09:04  Temp    Pulse 71 06/03/24 09:09  Resp 14 06/03/24 09:09  SpO2 99 % 06/03/24 09:09  Vitals shown include unfiled device data.  Last Pain:  Vitals:   06/03/24 0605  TempSrc: Oral  PainSc:          Complications: No notable events documented.

## 2024-06-03 NOTE — Anesthesia Postprocedure Evaluation (Signed)
 Anesthesia Post Note  Patient: Calton LITTIE Birmingham  Procedure(s) Performed: ARTHROPLASTY, HIP, TOTAL, ANTERIOR APPROACH (Right: Hip)     Patient location during evaluation: PACU Anesthesia Type: Spinal Level of consciousness: oriented and awake and alert Pain management: pain level controlled Vital Signs Assessment: post-procedure vital signs reviewed and stable Respiratory status: spontaneous breathing, respiratory function stable and nonlabored ventilation Cardiovascular status: blood pressure returned to baseline and stable Postop Assessment: no headache, no backache, no apparent nausea or vomiting, spinal receding and patient able to bend at knees Anesthetic complications: no   No notable events documented.  Last Vitals:  Vitals:   06/03/24 1015 06/03/24 1034  BP: 133/80 (!) 134/91  Pulse: (!) 54 (!) 56  Resp: 12 18  Temp: 36.4 C 36.6 C  SpO2: 99% 100%    Last Pain:  Vitals:   06/03/24 1046  TempSrc:   PainSc: 5                  Almetta Liddicoat A.

## 2024-06-03 NOTE — Op Note (Signed)
 Operative Note  Date of operation: 06/03/2024 Preoperative diagnosis: Right hip primary osteoarthritis Postoperative diagnosis: Same  Procedure: Right direct anterior total hip arthroplasty  Implants: Implant Name Type Inv. Item Serial No. Manufacturer Lot No. LRB No. Used Action  PIN SECTOR W/GRIP ACE CUP - [id number] Hips PIN SECTOR W/GRIP ACE CUP  [employer]PU[employer] [id number] Right 1 Implanted  LINER NEUTRAL 52X36MM PLUS 4 - [id number] Liner LINER NEUTRAL 52X36MM PLUS 4  DEPUY ORTHOPA[employer] [id number] Right 1 Implanted  HEAD CERAMIC 36 PLUS5 - [id number] Hips HEAD CERAMIC 36 PLUS5  DEPUY ORTH[employer] [id number] Right 1 Implanted  FEMORAL STEM 12/14 TPR SZ4 HIP - [id number] Orthopedic Implant FEMORAL STEM 12/14 TPR SZ4 HIP  DEPUY ORTH[employer] [id number] Right 1 Implanted   Surgeon: Lonni GRADE. Vernetta, MD Assistant: Tory Gaskins, PA-C  Anesthesia: Spinal EBL: 300 cc Antibiotics: IV Ancef  Complications: None  Indications: Denise Austin is a 53 year old female well-known to me.  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  She is been dealing with right hip pain for over a year now and eventually MRI was obtained right knee and it showed a significant degenerative labral tear with cartilage thinning and partial thickness cartilage loss at several areas and a large effusion.  She continues to walk with a limp and at this point her right hip pain is daily and it is detrimentally affecting her mobility, her quality of life and her actives daily living.  She has tried and failed all forms of conservative treatment and this point does wish to proceed with a hip replacement and we agree with this as well.  We did discuss in length and detail the risks of acute blood loss anemia, nerve and vessel injury, fracture, infection, DVT, dislocation, implant failure, leg length differences and wound healing issues.  She understands that our goals are hopefully decreased pain, improved  mobility and improve quality of life.  Procedure description: After informed consent was obtained and the appropriate right hip was marked, Denise Austin was brought to the operating room and set up on the stretcher where spinal anesthesia was obtained.  She was then laid in the spine position on the stretcher and a Foley catheter was placed.  Traction boots were placed on both her feet and next she was placed supine on the Hana fracture table with a perineal post in place in both legs in inline skeletal traction devices no traction applied.  We then assessed her right hip and pelvis radiographically.  The right hip was prepped and draped with DuraPrep and sterile drapes.  A timeout was called and she was identified as the correct patient and the correct right hip.  An incision was then made just inferior and posterior to the ASIS and carried slightly obliquely down the leg.  Dissection was carried down to the tensor fascia lata muscle and the tensor fascia was divided longitudinally to proceed with a direct anterior process of the hip.  Circumflex vessels were identified and cauterized.  The hip capsule was identified and opened up in L-type format finding a very large joint effusion.  There was definitely labral tearing as well.  Cobra retractors were placed around the medial and lateral femoral neck and a femoral neck cut was made with an oscillating saw just proximal to the lesser trochanter and this cut was completed with an osteotome.  A corkscrew guide was placed in the femoral head and the femoral head was removed in its entirety and there was a wide  area with significant cartilage wear.  A bent Hohmann was then placed over the medial acetabular rim and remnants of the acetabular labrum and other debris were removed.  Reaming was then initiated from a size 43 reamer and stepwise increments going up to a size 51 reamer with all reamers placed under direct visualization and the last reamer also placed under  direct fluoroscopy in order to obtain the depth of reaming, the inclination and the anteversion.  The real DePuy Sectra GRIPTION acetabular component size 52 was then placed without difficulty followed by a 36+4 polythene liner based on the patient's offset.  Attention was then turned to the femur.  With the right leg externally rotated 120 degrees, extended and adducted, a Mueller retractor was placed medially and a Hohmann tractor behind the greater trochanter.  The lateral joint capsule was released and a box cutting osteotome was used into the femoral canal.  Broaching was then initiated using the Actis broaching system from a size 0 going and stepwise increments up to a size 4.  With a size 4 in place we trialed a high offset femoral neck combined with a 36+1.5 trial hip ball.  The right leg was brought over and up and with traction and internal rotation reduced in the pelvis.  Based on radiographic and clinical assessment we needed a little more leg length.  We dislocated the hip remove the trial components.  We then placed the real Actis femoral component with high offset size 4 and with a 36+5 ceramic head ball.  This reduced in the pelvis and we are pleased with leg length, offset, range of motion and stability assessed clinically and radiographically.  The soft tissue was then irrigated with normal saline solution.  Remnants of the joint capsule  was closed with interrupted #1 Ethibond suture followed by normal Vicryl to close the tensor fascia.  0 Vicryl was used to close the deep tissue and 2-0 Vicryl was used to close subcutaneous tissue.  The skin was closed with interrupted 2-0 nylon suture.  An Aquacel dressing was applied.  The patient was taken off the Hana table and taken the recovery room.  Tory Gaskins, PA-C did assist during the entire case and beginning to end and his assistance was crucial and medically necessary for soft tissue management and retraction, helping guide implant placement and a  layered closure of the wound.

## 2024-06-03 NOTE — Evaluation (Signed)
 Physical Therapy Evaluation Patient Details Name: Denise Austin MRN: [id number] DOB: 1971/05/14 Today's Date: 06/03/2024  History of Present Illness  Pt s/p R THR and with hx of multiple back surgeries  Clinical Impression  Pt s/p R THR and presents with functional mobility limitations 2* post op pain and decreased R LE strength/ROM.  Pt should progress well to dc home with family assist.        If plan is discharge home, recommend the following: A little help with walking and/or transfers;A little help with bathing/dressing/bathroom;Assistance with cooking/housework;Assist for transportation;Help with stairs or ramp for entrance   Can travel by private vehicle        Equipment Recommendations None recommended by PT  Recommendations for Other Services       Functional Status Assessment Patient has had a recent decline in their functional status and demonstrates the ability to make significant improvements in function in a reasonable and predictable amount of time.     Precautions / Restrictions Precautions Precautions: Fall Recall of Precautions/Restrictions: Intact Restrictions Weight Bearing Restrictions Per Provider Order: No Other Position/Activity Restrictions: WBAT      Mobility  Bed Mobility Overal bed mobility: Needs Assistance Bed Mobility: Supine to Sit     Supine to sit: Mod assist     General bed mobility comments: cues for LE management and use of UEs to self assist    Transfers Overall transfer level: Needs assistance Equipment used: Rolling walker (2 wheels) Transfers: Sit to/from Stand Sit to Stand: Min assist, +2 physical assistance, From elevated surface           General transfer comment: cues for LE management and use of UEs to self assist    Ambulation/Gait Ambulation/Gait assistance: Min assist Gait Distance (Feet): 45 Feet Assistive device: Rolling walker (2 wheels) Gait Pattern/deviations: Step-to pattern, Decreased step  length - right, Decreased step length - left, Shuffle, Trunk flexed Gait velocity: decr     General Gait Details: cues for sequence, posture and position from [employer]            Wheelchair Mobility     Tilt Bed    Modified Rankin (Stroke Patients Only)       Balance Overall balance assessment: Needs assistance Sitting-balance support: No upper extremity supported, Feet supported Sitting balance-Leahy Scale: Good     Standing balance support: Bilateral upper extremity supported Standing balance-Leahy Scale: Poor                               Pertinent Vitals/Pain Pain Assessment Pain Assessment: 0-10 Pain Score: 5  Pain Location: R hip Pain Descriptors / Indicators: Aching, Sore Pain Intervention(s): Limited activity within patient's tolerance, Monitored during session, Premedicated before session, Ice applied    Home Living Family/patient expects to be discharged to:: Private residence Living Arrangements: Spouse/significant other;Children Available Help at Discharge: Family;Available 24 hours/day Type of Home: House Home Access: Stairs to enter Entrance Stairs-Rails: None Entrance Stairs-Number of Steps: 3   Home Layout: One level Home Equipment: Agricultural consultant (2 wheels)      Prior Function Prior Level of Function : Independent/Modified Independent             Mobility Comments: limping       Extremity/Trunk Assessment   Upper Extremity Assessment Upper Extremity Assessment: Overall WFL for tasks assessed    Lower Extremity Assessment Lower Extremity Assessment: RLE deficits/detail    Cervical /  Trunk Assessment Cervical / Trunk Assessment: Normal  Communication   Communication Communication: No apparent difficulties    Cognition Arousal: Alert Behavior During Therapy: WFL for tasks assessed/performed   PT - Cognitive impairments: No apparent impairments                         Following commands:  Intact       Cueing Cueing Techniques: Verbal cues     General Comments      Exercises Total Joint Exercises Ankle Circles/Pumps: AROM, Both, 15 reps, Supine   Assessment/Plan    PT Assessment Patient needs continued PT services  PT Problem List Decreased strength;Decreased range of motion;Decreased activity tolerance;Decreased balance;Decreased mobility;Decreased knowledge of use of DME;Pain       PT Treatment Interventions DME instruction;Gait training;Stair training;Functional mobility training;Therapeutic activities;Therapeutic exercise;Patient/family education    PT Goals (Current goals can be found in the Care Plan section)  Acute Rehab PT Goals Patient Stated Goal: Regain IND PT Goal Formulation: With patient Time For Goal Achievement: 06/10/24 Potential to Achieve Goals: Good    Frequency 7X/week     Co-evaluation               AM-PAC PT 6 Clicks Mobility  Outcome Measure Help needed turning from your back to your side while in a flat bed without using bedrails?: A Little Help needed moving from lying on your back to sitting on the side of a flat bed without using bedrails?: A Little Help needed moving to and from a bed to a chair (including a wheelchair)?: A Little Help needed standing up from a chair using your arms (e.g., wheelchair or bedside chair)?: A Little Help needed to walk in hospital room?: A Little Help needed climbing 3-5 steps with a railing? : A Little 6 Click Score: 18    End of Session Equipment Utilized During Treatment: Gait belt Activity Tolerance: Patient tolerated treatment well Patient left: in chair;with call bell/phone within reach;with chair alarm set Nurse Communication: Mobility status PT Visit Diagnosis: Unsteadiness on feet (R26.81);Difficulty in walking, not elsewhere classified (R26.2)    Time: 8565-8491 PT Time Calculation (min) (ACUTE ONLY): 34 min   Charges:   PT Evaluation $PT Eval Low Complexity: 1 Low PT  Treatments $Gait Training: 8-22 mins PT General Charges $$ ACUTE PT VISIT: 1 Visit         [facility] PT Acute Rehabilitation Services Office [phone]   Latora Quarry 06/03/2024, 3:53 PM

## 2024-06-03 NOTE — Anesthesia Procedure Notes (Signed)
 Spinal  Patient location during procedure: OR Start time: 06/03/2024 7:22 AM End time: 06/03/2024 7:27 AM Reason for block: surgical anesthesia Staffing Performed: anesthesiologist  Anesthesiologist: Jerrye Sharper, MD Performed by: Jerrye Sharper, MD Authorized by: Jerrye Sharper, MD   Preanesthetic Checklist Completed: patient identified, IV checked, site marked, risks and benefits discussed, surgical consent, monitors and equipment checked, pre-op evaluation and timeout performed Spinal Block Patient position: sitting Prep: DuraPrep and site prepped and draped Patient monitoring: heart rate, cardiac monitor, continuous pulse ox and blood pressure Approach: midline Location: L3-4 Injection technique: single-shot Needle Needle type: [place]  Needle gauge: 25 G Needle length: 12.7 cm Needle insertion depth: 11 cm Assessment Sensory level: T8 Events: CSF return Additional Notes Patient tolerated procedure well. Technically difficult SAB due to MO. Attempt x 3.  Adequate sensory level.

## 2024-06-04 DIAGNOSIS — M1611 Unilateral primary osteoarthritis, right hip: Secondary | ICD-10-CM | POA: Diagnosis not present

## 2024-06-04 LAB — BASIC METABOLIC PANEL WITH GFR
Anion gap: 11 (ref 5–15)
BUN: 13 mg/dL (ref 6–20)
CO2: 25 mmol/L (ref 22–32)
Calcium: 8.7 mg/dL — ABNORMAL LOW (ref 8.9–10.3)
Chloride: 102 mmol/L (ref 98–111)
Creatinine, Ser: 0.82 mg/dL (ref 0.44–1.00)
GFR, Estimated: >60 mL/min (ref >60)
Glucose, Bld: 149 mg/dL — ABNORMAL HIGH (ref 70–99)
Potassium: 3.7 mmol/L (ref 3.5–5.1)
Sodium: 138 mmol/L (ref 135–145)

## 2024-06-04 LAB — CBC
HCT: 33.3 % — ABNORMAL LOW (ref 36.0–46.0)
Hemoglobin: 10.7 g/dL — ABNORMAL LOW (ref 12.0–15.0)
MCH: 30.6 pg (ref 26.0–34.0)
MCHC: 32.1 g/dL (ref 30.0–36.0)
MCV: 95.1 fL (ref 80.0–100.0)
Platelets: 232 K/uL (ref 150–400)
RBC: 3.5 MIL/uL — ABNORMAL LOW (ref 3.87–5.11)
RDW: 12.5 % (ref 11.5–15.5)
WBC: 12.7 K/uL — ABNORMAL HIGH (ref 4.0–10.5)
nRBC: 0 % (ref 0.0–0.2)

## 2024-06-04 MED ORDER — ONDANSETRON 4 MG PO TBDP: 4.0000 mg | ORAL_TABLET | Freq: Three times a day (TID) | ORAL | 0 refills | Status: DC | PRN

## 2024-06-04 MED ORDER — ASPIRIN 81 MG PO CHEW: 81.0000 mg | CHEWABLE_TABLET | Freq: Two times a day (BID) | ORAL | 0 refills | Status: DC

## 2024-06-04 NOTE — Discharge Instructions (Signed)

## 2024-06-04 NOTE — Progress Notes (Signed)
 Physical Therapy Treatment Patient Details Name: Denise Austin MRN: [id number] DOB: 1971-02-17 Today's Date: 06/04/2024   History of Present Illness Pt s/p R THR and with hx of multiple back surgeries    PT Comments  Pt feeling much better with no c/o nausea.  Pt requiring increased time but up to ambulate increased distance in hall and up to bathroom for toileting with hand hygiene standing at sink.  Pt hopeful for dc home tomorrow.    If plan is discharge home, recommend the following: A little help with walking and/or transfers;A little help with bathing/dressing/bathroom;Assistance with cooking/housework;Assist for transportation;Help with stairs or ramp for entrance   Can travel by private vehicle        Equipment Recommendations  None recommended by PT    Recommendations for Other Services       Precautions / Restrictions Precautions Precautions: Fall Recall of Precautions/Restrictions: Intact Restrictions Weight Bearing Restrictions Per Provider Order: No Other Position/Activity Restrictions: WBAT     Mobility  Bed Mobility Overal bed mobility: Needs Assistance Bed Mobility: Sit to Supine     Supine to sit: Min assist, Contact guard     General bed mobility comments: Increased time with cues for sequence and use of gait belt to self assist R LE    Transfers Overall transfer level: Needs assistance Equipment used: Rolling walker (2 wheels) Transfers: Sit to/from Stand Sit to Stand: Min assist, Contact guard assist           General transfer comment: cues for LE management and use of UEs to self assist    Ambulation/Gait Ambulation/Gait assistance: Contact guard assist, Supervision Gait Distance (Feet): 100 Feet (and 15' back from bathroom) Assistive device: Rolling walker (2 wheels) Gait Pattern/deviations: Step-to pattern, Decreased step length - right, Decreased step length - left, Shuffle, Trunk flexed Gait velocity: decr     General Gait  Details: cues for sequence, posture and position from RW; distance ltd by nausea   Stairs             Wheelchair Mobility     Tilt Bed    Modified Rankin (Stroke Patients Only)       Balance Overall balance assessment: Needs assistance Sitting-balance support: No upper extremity supported, Feet supported Sitting balance-Leahy Scale: Good     Standing balance support: No upper extremity supported Standing balance-Leahy Scale: Fair                              Hotel manager: No apparent difficulties  Cognition Arousal: Alert Behavior During Therapy: WFL for tasks assessed/performed   PT - Cognitive impairments: No apparent impairments                         Following commands: Intact      Cueing Cueing Techniques: Verbal cues  Exercises Total Joint Exercises Ankle Circles/Pumps: AROM, Both, 15 reps, Supine Quad Sets: AROM, Both, 10 reps, Supine Heel Slides: AAROM, Right, 20 reps, Supine Hip ABduction/ADduction: AAROM, Right, 15 reps, Supine    General Comments        Pertinent Vitals/Pain Pain Assessment Pain Assessment: 0-10 Pain Score: 4  Pain Location: R hip Pain Descriptors / Indicators: Aching, Sore Pain Intervention(s): Limited activity within patient's tolerance, Monitored during session, Premedicated before session, Ice applied    Home Living  Prior Function            PT Goals (current goals can now be found in the care plan section) Acute Rehab PT Goals Patient Stated Goal: Regain IND PT Goal Formulation: With patient Time For Goal Achievement: 06/10/24 Potential to Achieve Goals: Good Progress towards PT goals: Progressing toward goals    Frequency    7X/week      PT Plan      Co-evaluation              AM-PAC PT 6 Clicks Mobility   Outcome Measure  Help needed turning from your back to your side while in a flat bed without  using bedrails?: A Little Help needed moving from lying on your back to sitting on the side of a flat bed without using bedrails?: A Little Help needed moving to and from a bed to a chair (including a wheelchair)?: A Little Help needed standing up from a chair using your arms (e.g., wheelchair or bedside chair)?: A Little Help needed to walk in hospital room?: A Little Help needed climbing 3-5 steps with a railing? : A Little 6 Click Score: 18    End of Session Equipment Utilized During Treatment: Gait belt Activity Tolerance: Patient tolerated treatment well;Other (comment) Patient left: in bed;with call bell/phone within reach;with bed alarm set;with family/visitor present Nurse Communication: Mobility status PT Visit Diagnosis: Unsteadiness on feet (R26.81);Difficulty in walking, not elsewhere classified (R26.2)     Time: 8476-8451 PT Time Calculation (min) (ACUTE ONLY): 25 min  Charges:    $Gait Training: 8-22 mins $Therapeutic Activity: 8-22 mins PT General Charges $$ ACUTE PT VISIT: 1 Visit                     [facility] PT Acute Rehabilitation Services Office [phone]    Chriss Redel 06/04/2024, 4:01 PM

## 2024-06-04 NOTE — Progress Notes (Signed)
 Physical Therapy Treatment Patient Details Name: Denise Austin MRN: [id number] DOB: 12-13-1970 Today's Date: 06/04/2024   History of Present Illness Pt s/p R THR and with hx of multiple back surgeries    PT Comments  Pt very cooperative with noted improvement in mobility but limited activity 2* ongoing nausea.    If plan is discharge home, recommend the following: A little help with walking and/or transfers;A little help with bathing/dressing/bathroom;Assistance with cooking/housework;Assist for transportation;Help with stairs or ramp for entrance   Can travel by private vehicle        Equipment Recommendations  None recommended by PT    Recommendations for Other Services       Precautions / Restrictions Precautions Precautions: Fall Recall of Precautions/Restrictions: Intact Restrictions Weight Bearing Restrictions Per Provider Order: No Other Position/Activity Restrictions: WBAT     Mobility  Bed Mobility Overal bed mobility: Needs Assistance Bed Mobility: Supine to Sit     Supine to sit: Min assist     General bed mobility comments: cues for LE management and use of UEs to self assist    Transfers Overall transfer level: Needs assistance Equipment used: Rolling walker (2 wheels) Transfers: Sit to/from Stand Sit to Stand: Min assist           General transfer comment: cues for LE management and use of UEs to self assist    Ambulation/Gait Ambulation/Gait assistance: Min assist, Contact guard assist Gait Distance (Feet): 45 Feet Assistive device: Rolling walker (2 wheels) Gait Pattern/deviations: Step-to pattern, Decreased step length - right, Decreased step length - left, Shuffle, Trunk flexed Gait velocity: decr     General Gait Details: cues for sequence, posture and position from RW; distance ltd by nausea   Stairs             Wheelchair Mobility     Tilt Bed    Modified Rankin (Stroke Patients Only)       Balance Overall  balance assessment: Needs assistance Sitting-balance support: No upper extremity supported, Feet supported Sitting balance-Leahy Scale: Good     Standing balance support: Bilateral upper extremity supported Standing balance-Leahy Scale: Poor                              Communication Communication Communication: No apparent difficulties  Cognition Arousal: Alert Behavior During Therapy: WFL for tasks assessed/performed   PT - Cognitive impairments: No apparent impairments                         Following commands: Intact      Cueing Cueing Techniques: Verbal cues  Exercises Total Joint Exercises Ankle Circles/Pumps: AROM, Both, 15 reps, Supine Quad Sets: AROM, Both, 10 reps, Supine Heel Slides: AAROM, Right, 20 reps, Supine Hip ABduction/ADduction: AAROM, Right, 15 reps, Supine    General Comments        Pertinent Vitals/Pain Pain Assessment Pain Assessment: 0-10 Pain Score: 6  Pain Location: R hip Pain Descriptors / Indicators: Aching, Sore Pain Intervention(s): Limited activity within patient's tolerance, Monitored during session, Premedicated before session, Ice applied    Home Living                          Prior Function            PT Goals (current goals can now be found in the care plan section) Acute Rehab PT Goals  Patient Stated Goal: Regain IND PT Goal Formulation: With patient Time For Goal Achievement: 06/10/24 Potential to Achieve Goals: Good Progress towards PT goals: Progressing toward goals    Frequency    7X/week      PT Plan      Co-evaluation              AM-PAC PT 6 Clicks Mobility   Outcome Measure  Help needed turning from your back to your side while in a flat bed without using bedrails?: A Little Help needed moving from lying on your back to sitting on the side of a flat bed without using bedrails?: A Little Help needed moving to and from a bed to a chair (including a  wheelchair)?: A Little Help needed standing up from a chair using your arms (e.g., wheelchair or bedside chair)?: A Little Help needed to walk in hospital room?: A Little Help needed climbing 3-5 steps with a railing? : A Little 6 Click Score: 18    End of Session Equipment Utilized During Treatment: Gait belt Activity Tolerance: Patient tolerated treatment well;Other (comment) (nausea) Patient left: in chair;with call bell/phone within reach;with chair alarm set Nurse Communication: Mobility status PT Visit Diagnosis: Unsteadiness on feet (R26.81);Difficulty in walking, not elsewhere classified (R26.2)     Time: 0827-0901 PT Time Calculation (min) (ACUTE ONLY): 34 min  Charges:    $Gait Training: 8-22 mins $Therapeutic Exercise: 8-22 mins PT General Charges $$ ACUTE PT VISIT: 1 Visit                     [facility] PT Acute Rehabilitation Services Office [phone]    Maisen Klingler 06/04/2024, 12:55 PM

## 2024-06-04 NOTE — Plan of Care (Signed)
  Problem: Education: Goal: Knowledge of General Education information will improve Description: Including pain rating scale, medication(s)/side effects and non-pharmacologic comfort measures Outcome: Progressing   Problem: Health Behavior/Discharge Planning: Goal: Ability to manage health-related needs will improve Outcome: Progressing   Problem: Activity: Goal: Risk for activity intolerance will decrease Outcome: Progressing   Problem: Nutrition: Goal: Adequate nutrition will be maintained Outcome: Progressing   Problem: Elimination: Goal: Will not experience complications related to bowel motility Outcome: Progressing Goal: Will not experience complications related to urinary retention Outcome: Progressing   Problem: Pain Managment: Goal: General experience of comfort will improve and/or be controlled Outcome: Progressing   Problem: Safety: Goal: Ability to remain free from injury will improve Outcome: Progressing   Problem: Skin Integrity: Goal: Risk for impaired skin integrity will decrease Outcome: Progressing

## 2024-06-04 NOTE — Progress Notes (Signed)
 Subjective: 1 Day Post-Op Procedure(s) (LRB): ARTHROPLASTY, HIP, TOTAL, ANTERIOR APPROACH (Right) Patient reports pain as moderate.  A lot of nausea post-op.  Objective: Vital signs in last 24 hours: Temp:  [98 F (36.7 C)-98.6 F (37 C)] 98.1 F (36.7 C) (08/09 0923) Pulse Rate:  [72-91] 76 (08/09 0923) Resp:  [16-19] 16 (08/09 0923) BP: (104-148)/(67-91) 117/72 (08/09 0923) SpO2:  [97 %-100 %] 97 % (08/09 0923)  Intake/Output from previous day: 08/08 0701 - 08/09 0700 In: 3910 [P.O.:960; I.V.:2750; IV Piggyback:200] Out: 3565 [Urine:3265; Blood:300] Intake/Output this shift: No intake/output data recorded.  Recent Labs    06/04/24 0353  HGB 10.7*   Recent Labs    06/04/24 0353  WBC 12.7*  RBC 3.50*  HCT 33.3*  PLT 232   Recent Labs    06/04/24 0353  NA 138  K 3.7  CL 102  CO2 25  BUN 13  CREATININE 0.82  GLUCOSE 149*  CALCIUM 8.7*   No results for input(s): LABPT, INR in the last 72 hours.  Sensation intact distally Intact pulses distally Dorsiflexion/Plantar flexion intact Incision: scant drainage   Assessment/Plan: 1 Day Post-Op Procedure(s) (LRB): ARTHROPLASTY, HIP, TOTAL, ANTERIOR APPROACH (Right) Up with therapy Plan for discharge tomorrow Discharge home with home health      Lonni CINDERELLA Poli 06/04/2024, 11:57 AM

## 2024-06-05 DIAGNOSIS — M1611 Unilateral primary osteoarthritis, right hip: Secondary | ICD-10-CM | POA: Diagnosis not present

## 2024-06-05 NOTE — Plan of Care (Signed)

## 2024-06-05 NOTE — Plan of Care (Signed)
Discharge instructions given to the patient including medications and follow ups.

## 2024-06-05 NOTE — Progress Notes (Signed)
 Subjective: 2 Days Post-Op Procedure(s) (LRB): ARTHROPLASTY, HIP, TOTAL, ANTERIOR APPROACH (Right) Patient reports pain as moderate.  Nausea improved.  Objective: Vital signs in last 24 hours: Temp:  [97.8 F (36.6 C)-98.6 F (37 C)] 98.6 F (37 C) (08/10 0644) Pulse Rate:  [76-103] 103 (08/10 0644) Resp:  [16-19] 17 (08/10 0644) BP: (117-150)/(72-90) 150/89 (08/10 0644) SpO2:  [95 %-100 %] 100 % (08/10 0644)  Intake/Output from previous day: 08/09 0701 - 08/10 0700 In: 800 [P.O.:800] Out: 2450 [Urine:2450] Intake/Output this shift: No intake/output data recorded.  Recent Labs    06/04/24 0353  HGB 10.7*   Recent Labs    06/04/24 0353  WBC 12.7*  RBC 3.50*  HCT 33.3*  PLT 232   Recent Labs    06/04/24 0353  NA 138  K 3.7  CL 102  CO2 25  BUN 13  CREATININE 0.82  GLUCOSE 149*  CALCIUM 8.7*   No results for input(s): LABPT, INR in the last 72 hours.  Sensation intact distally Intact pulses distally Dorsiflexion/Plantar flexion intact Incision: scant drainage   Assessment/Plan: 2 Days Post-Op Procedure(s) (LRB): ARTHROPLASTY, HIP, TOTAL, ANTERIOR APPROACH (Right) DC home today following PT      Jeremiah Curci 06/05/2024, 9:19 AM

## 2024-06-05 NOTE — Progress Notes (Signed)
 Physical Therapy Treatment Patient Details Name: Denise Austin MRN: [id number] DOB: [birth year] Today's Date: 06/05/2024   History of Present Illness Pt s/p R THR and with hx of multiple back surgeries    PT Comments  Pt continues very cooperative and progressing well with mobility.  Pt up to ambulate in hall, negotiated stairs, reviewed car transfers and reviewed dressing techniques.  Pt eager for dc home later this date.    If plan is discharge home, recommend the following: A little help with walking and/or transfers;A little help with bathing/dressing/bathroom;Assistance with cooking/housework;Assist for transportation;Help with stairs or ramp for entrance   Can travel by private vehicle        Equipment Recommendations  None recommended by PT    Recommendations for Other Services       Precautions / Restrictions Precautions Precautions: Fall Recall of Precautions/Restrictions: Intact Restrictions Weight Bearing Restrictions Per Provider Order: No Other Position/Activity Restrictions: WBAT     Mobility  Bed Mobility Overal bed mobility: Needs Assistance Bed Mobility: Supine to Sit     Supine to sit: Contact guard     General bed mobility comments: Pt up in chair and returns to same    Transfers Overall transfer level: Needs assistance Equipment used: Rolling walker (2 wheels) Transfers: Sit to/from Stand Sit to Stand: Contact guard assist, Supervision           General transfer comment: self cues for LE management and use of UEs to self assist    Ambulation/Gait Ambulation/Gait assistance: Contact guard assist, Supervision Gait Distance (Feet): 60 Feet (and 15' twice to/from bathroom) Assistive device: Rolling walker (2 wheels) Gait Pattern/deviations: Step-to pattern, Decreased step length - right, Decreased step length - left, Shuffle, Trunk flexed Gait velocity: decr     General Gait Details: self cues for sequence, posture and position from  RW   Stairs Stairs: Yes Stairs assistance: Min assist Stair Management: No rails, Step to pattern, Forwards, With walker, Backwards Number of Stairs: 6 General stair comments: 2 steps fwd with RW at top of steps and 2 steps bkwd twice.  Cues for sequence and RW placement   Wheelchair Mobility     Tilt Bed    Modified Rankin (Stroke Patients Only)       Balance Overall balance assessment: Needs assistance Sitting-balance support: No upper extremity supported, Feet supported Sitting balance-Leahy Scale: Good     Standing balance support: No upper extremity supported Standing balance-Leahy Scale: Fair                              Hotel manager: No apparent difficulties  Cognition Arousal: Alert Behavior During Therapy: WFL for tasks assessed/performed   PT - Cognitive impairments: No apparent impairments                         Following commands: Intact      Cueing Cueing Techniques: Verbal cues  Exercises Total Joint Exercises Ankle Circles/Pumps: AROM, Both, 15 reps, Supine Quad Sets: AROM, Both, 10 reps, Supine Heel Slides: AAROM, Right, 20 reps, Supine Hip ABduction/ADduction: AAROM, Right, 15 reps, Supine Long Arc Quad: AAROM, Right, 10 reps, Seated    General Comments        Pertinent Vitals/Pain Pain Assessment Pain Assessment: 0-10 Pain Score: 4  Pain Location: R hip Pain Descriptors / Indicators: Aching, Sore Pain Intervention(s): Limited activity within patient's tolerance, Monitored during session, Premedicated  before session, Ice applied    Home Living                          Prior Function            PT Goals (current goals can now be found in the care plan section) Acute Rehab PT Goals Patient Stated Goal: Regain IND PT Goal Formulation: With patient Time For Goal Achievement: 06/10/24 Potential to Achieve Goals: Good Progress towards PT goals: Progressing toward  goals    Frequency    7X/week      PT Plan      Co-evaluation              AM-PAC PT 6 Clicks Mobility   Outcome Measure  Help needed turning from your back to your side while in a flat bed without using bedrails?: A Little Help needed moving from lying on your back to sitting on the side of a flat bed without using bedrails?: A Little Help needed moving to and from a bed to a chair (including a wheelchair)?: A Little Help needed standing up from a chair using your arms (e.g., wheelchair or bedside chair)?: A Little Help needed to walk in hospital room?: A Little Help needed climbing 3-5 steps with a railing? : A Little 6 Click Score: 18    End of Session Equipment Utilized During Treatment: Gait belt Activity Tolerance: Patient tolerated treatment well;Other (comment) Patient left: in chair;with call bell/phone within reach;with chair alarm set Nurse Communication: Mobility status PT Visit Diagnosis: Unsteadiness on feet (R26.81);Difficulty in walking, not elsewhere classified (R26.2)     Time: 8792-8764 PT Time Calculation (min) (ACUTE ONLY): 28 min  Charges:    $Gait Training: 8-22 mins $Therapeutic Exercise: 8-22 mins $Therapeutic Activity: 8-22 mins PT General Charges $$ ACUTE PT VISIT: 1 Visit                     [facility] PT Acute Rehabilitation Services Office [phone]    Rajean Desantiago 06/05/2024, 1:04 PM

## 2024-06-05 NOTE — Progress Notes (Signed)
 Physical Therapy Treatment Patient Details Name: Denise Austin MRN: [id number] DOB: [birth year] Today's Date: 06/05/2024   History of Present Illness Pt s/p R THR and with hx of multiple back surgeries    PT Comments  Pt cooperative and progressing well with mobility and with no c/o nausea or dizziness.    If plan is discharge home, recommend the following: A little help with walking and/or transfers;A little help with bathing/dressing/bathroom;Assistance with cooking/housework;Assist for transportation;Help with stairs or ramp for entrance   Can travel by private vehicle        Equipment Recommendations  None recommended by PT    Recommendations for Other Services       Precautions / Restrictions Precautions Precautions: Fall Recall of Precautions/Restrictions: Intact Restrictions Weight Bearing Restrictions Per Provider Order: No Other Position/Activity Restrictions: WBAT     Mobility  Bed Mobility Overal bed mobility: Needs Assistance Bed Mobility: Supine to Sit     Supine to sit: Contact guard     General bed mobility comments: Increased time with cues for sequence and use of gait belt to self assist R LE    Transfers Overall transfer level: Needs assistance Equipment used: Rolling walker (2 wheels) Transfers: Sit to/from Stand Sit to Stand: Contact guard assist           General transfer comment: cues for LE management and use of UEs to self assist    Ambulation/Gait Ambulation/Gait assistance: Contact guard assist, Supervision Gait Distance (Feet): 140 Feet Assistive device: Rolling walker (2 wheels) Gait Pattern/deviations: Step-to pattern, Decreased step length - right, Decreased step length - left, Shuffle, Trunk flexed Gait velocity: decr     General Gait Details: cues for sequence, posture and position from RW; distance ltd by nausea   Stairs             Wheelchair Mobility     Tilt Bed    Modified Rankin (Stroke Patients  Only)       Balance Overall balance assessment: Needs assistance Sitting-balance support: No upper extremity supported, Feet supported Sitting balance-Leahy Scale: Good     Standing balance support: No upper extremity supported Standing balance-Leahy Scale: Fair                              Hotel manager: No apparent difficulties  Cognition Arousal: Alert Behavior During Therapy: WFL for tasks assessed/performed   PT - Cognitive impairments: No apparent impairments                         Following commands: Intact      Cueing Cueing Techniques: Verbal cues  Exercises Total Joint Exercises Ankle Circles/Pumps: AROM, Both, 15 reps, Supine Quad Sets: AROM, Both, 10 reps, Supine Heel Slides: PROM Hip ABduction/ADduction: PROM Long Arc Quad: AAROM, Right, 10 reps, Seated    General Comments        Pertinent Vitals/Pain Pain Assessment Pain Assessment: 0-10 Pain Score: 4  Pain Location: R hip Pain Descriptors / Indicators: Aching, Sore Pain Intervention(s): Limited activity within patient's tolerance, Monitored during session, Premedicated before session, Ice applied    Home Living                          Prior Function            PT Goals (current goals can now be found in the care plan  section) Acute Rehab PT Goals Patient Stated Goal: Regain IND PT Goal Formulation: With patient Time For Goal Achievement: 06/10/24 Potential to Achieve Goals: Good Progress towards PT goals: Progressing toward goals    Frequency    7X/week      PT Plan      Co-evaluation              AM-PAC PT 6 Clicks Mobility   Outcome Measure  Help needed turning from your back to your side while in a flat bed without using bedrails?: A Little Help needed moving from lying on your back to sitting on the side of a flat bed without using bedrails?: A Little Help needed moving to and from a bed to a chair  (including a wheelchair)?: A Little Help needed standing up from a chair using your arms (e.g., wheelchair or bedside chair)?: A Little Help needed to walk in hospital room?: A Little Help needed climbing 3-5 steps with a railing? : A Little 6 Click Score: 18    End of Session Equipment Utilized During Treatment: Gait belt Activity Tolerance: Patient tolerated treatment well;Other (comment) Patient left: in chair;with call bell/phone within reach;with chair alarm set Nurse Communication: Mobility status PT Visit Diagnosis: Unsteadiness on feet (R26.81);Difficulty in walking, not elsewhere classified (R26.2)     Time: 9079-9044 PT Time Calculation (min) (ACUTE ONLY): 35 min  Charges:    $Gait Training: 8-22 mins $Therapeutic Exercise: 8-22 mins PT General Charges $$ ACUTE PT VISIT: 1 Visit                     [facility] PT Acute Rehabilitation Services Office [phone]    Dyonna Jaspers 06/05/2024, 12:58 PM

## 2024-06-05 NOTE — Discharge Summary (Signed)
 Patient ID: Denise Austin MRN: [id number] DOB/AGE: May 13, 1971 53 y.o.  Admit date: 06/03/2024 Discharge date: 06/05/2024  Admission Diagnoses:  Unilateral primary osteoarthritis, right hip  Discharge Diagnoses:  Principal Problem:   Unilateral primary osteoarthritis, right hip Active Problems:   Status post total replacement of right hip   Past Medical History:  Diagnosis Date   Allergy    Arthritis    GERD (gastroesophageal reflux disease)    Hyperglycemia    Hypertension    OSA (obstructive sleep apnea) 04/15/2018   PONV (postoperative nausea and vomiting)    Pre-diabetes    Sleep apnea    wears cpap    Uterine fibroid     Surgeries: Procedure(s): ARTHROPLASTY, HIP, TOTAL, ANTERIOR APPROACH on 06/03/2024   Consultants (if any):   Discharged Condition: Improved  Hospital Course: Denise Austin is an 53 y.o. female who was admitted 06/03/2024 with a diagnosis of Unilateral primary osteoarthritis, right hip and went to the operating room on 06/03/2024 and underwent the above named procedures.    She was given perioperative antibiotics:  Anti-infectives (From admission, onward)    Start     Dose/Rate Route Frequency Ordered Stop   06/03/24 1300  ceFAZolin  (ANCEF ) IVPB 2g/100 mL premix        2 g 200 mL/hr over 30 Minutes Intravenous Every 6 hours 06/03/24 1027 06/03/24 1914   06/03/24 0600  ceFAZolin  (ANCEF ) IVPB 2g/100 mL premix        2 g 200 mL/hr over 30 Minutes Intravenous On call to O.R. 06/03/24 0530 06/03/24 9285     .  She was given sequential compression devices, early ambulation, and appropriate chemoprophylaxis for DVT prophylaxis.  She benefited maximally from the hospital stay and there were no complications.    Recent vital signs:  Vitals:   06/04/24 2205 06/05/24 0644  BP: (!) 146/90 (!) 150/89  Pulse: 94 (!) 103  Resp: 19 17  Temp: 98.6 F (37 C) 98.6 F (37 C)  SpO2: 95% 100%    Recent laboratory studies:  Lab Results   Component Value Date   HGB 10.7 (L) 06/04/2024   HGB 12.7 05/23/2024   HGB 13.7 12/24/2021   Lab Results  Component Value Date   WBC 12.7 (H) 06/04/2024   PLT 232 06/04/2024   Lab Results  Component Value Date   INR 1.00 04/14/2017   Lab Results  Component Value Date   NA 138 06/04/2024   K 3.7 06/04/2024   CL 102 06/04/2024   CO2 25 06/04/2024   BUN 13 06/04/2024   CREATININE 0.82 06/04/2024   GLUCOSE 149 (H) 06/04/2024    Discharge Medications:   Allergies as of 06/05/2024   No Known Allergies      Medication List     TAKE these medications    Align 10 MG Caps Take 1 capsule by mouth daily.   aspirin  81 MG chewable tablet Chew 1 tablet (81 mg total) by mouth 2 (two) times daily.   BIOTIN PO Take 1 tablet by mouth daily. Gummy vitamin   gabapentin  300 MG capsule Commonly known as: NEURONTIN  Take 1 capsule (300 mg total) by mouth 2 (two) times daily. What changed:  when to take this reasons to take this   ibuprofen  800 MG tablet Commonly known as: ADVIL  Take 1 tablet (800 mg total) by mouth every 8 (eight) hours as needed.   losartan  50 MG tablet Commonly known as: COZAAR  Take 1 tablet (50 mg total)  by mouth daily.   metoprolol  succinate 100 MG 24 hr tablet Commonly known as: TOPROL -XL Take 1 tablet (100 mg total) by mouth daily. Take with or immediately following a meal.   omeprazole  20 MG capsule Commonly known as: PRILOSEC Take 1 capsule (20 mg total) by mouth daily.   ondansetron  4 MG disintegrating tablet Commonly known as: ZOFRAN -ODT Take 1 tablet (4 mg total) by mouth every 8 (eight) hours as needed.   oxyCODONE  5 MG immediate release tablet Commonly known as: Oxy IR/ROXICODONE  Take 1-2 tablets (5-10 mg total) by mouth every 4 (four) hours as needed for severe pain (pain score 7-10).   SUMAtriptan  50 MG tablet Commonly known as: Imitrex  Take 1 tablet (50 mg total) by mouth every 2 (two) hours as needed for migraine. May repeat in 2  hours if headache persists or recurs.   tirzepatide  2.5 MG/0.5ML Pen Commonly known as: ZEPBOUND  Inject 2.5 mg into the skin once a week.   tiZANidine  4 MG tablet Commonly known as: Zanaflex  Take 1 tablet (4 mg total) by mouth every 6 (six) hours as needed for muscle spasms.   valACYclovir  500 MG tablet Commonly known as: VALTREX  Take 1 tablet (500 mg total) by mouth daily.               Durable Medical Equipment  (From admission, onward)           Start     Ordered   06/03/24 1028  DME 3 n 1  Once        06/03/24 1027   06/03/24 1028  DME Walker rolling  Once       Question Answer Comment  Walker: With 5 Inch Wheels   Patient needs a walker to treat with the following condition Status post total replacement of right hip      06/03/24 1027            Diagnostic Studies: DG Pelvis Portable Result Date: 06/03/2024 CLINICAL DATA:  Post right hip replacement. EXAM: PORTABLE PELVIS 1-2 VIEWS COMPARISON:  02/22/2024 and earlier 06/03/2024 FINDINGS: Evidence of patient's recent right total hip arthroplasty which is intact and normally located. Remainder of the exam is unchanged. IMPRESSION: Expected changes post right total hip arthroplasty. Electronically Signed   By: Toribio Agreste M.D.   On: 06/03/2024 10:16   DG HIP UNILAT WITH PELVIS 1V RIGHT Result Date: 06/03/2024 CLINICAL DATA:  [id number] Surgery, elective [id number] EXAM: DG HIP (WITH OR WITHOUT PELVIS) 1V RIGHT COMPARISON:  None Available. FINDINGS: Several intraoperative spot images of right hip arthroplasty are submitted. Please refer to the separately issued operative report for further details. Fluoroscopy time: 13 seconds Radiation dose: 3.4258 mGy IMPRESSION: Intraoperative fluoroscopy provided for right hip arthroplasty. Electronically Signed   By: Ree Molt M.D.   On: 06/03/2024 08:45   DG C-Arm 1-60 Min-No Report Result Date: 06/03/2024 Fluoroscopy was utilized by the requesting physician.  No radiographic  interpretation.    Disposition:      Follow-up Information     Vernetta Lonni GRADE, MD Follow up in 2 week(s).   Specialty: Orthopedic Surgery Contact information: [address] [place]  [place] [address] [phone]                  Signed: ELSPETH PARKER 06/05/2024, 9:19 AM

## 2024-06-07 ENCOUNTER — Encounter (HOSPITAL_COMMUNITY): Payer: Self-pay | Admitting: Orthopaedic Surgery

## 2024-06-16 ENCOUNTER — Other Ambulatory Visit (HOSPITAL_COMMUNITY): Payer: Self-pay

## 2024-06-16 ENCOUNTER — Ambulatory Visit (INDEPENDENT_AMBULATORY_CARE_PROVIDER_SITE_OTHER): Admitting: Orthopaedic Surgery

## 2024-06-16 ENCOUNTER — Encounter: Payer: Self-pay | Admitting: Orthopaedic Surgery

## 2024-06-16 ENCOUNTER — Other Ambulatory Visit: Payer: Self-pay

## 2024-06-16 DIAGNOSIS — Z96641 Presence of right artificial hip joint: Secondary | ICD-10-CM

## 2024-06-16 MED ORDER — OXYCODONE HCL 5 MG PO TABS
5.0000 mg | ORAL_TABLET | Freq: Four times a day (QID) | ORAL | 0 refills | Status: AC | PRN
  Filled 2024-06-16: qty 30, 4d supply, fill #0

## 2024-06-16 NOTE — Progress Notes (Signed)
 Denise Austin is here today at 2 weeks status post a right total hip replacement to treat significant right hip pain and arthritis.  She has been ambulating with a walker and doing some exercises on her own.  She has not had any type of therapy.  She does need a refill of pain medication.  She has been compliant with a baby aspirin  twice daily.  Exam there is not a lot of swelling in her feet and ankles at all.  Her calf is soft bilaterally.  Her leg lengths appear equal.  Her right hip incision looks good.  Sutures have been removed and Steri-Strips applied.  Will send in some oxycodone  for her.  We will set her up for outpatient physical therapy at [facility] for transitioning from a walker to a cane to hopefully eventually ambulating without any assist device.  They can work on her balance and coordination as well.  Will see her back in a month to see how she is doing overall but no x-rays are needed.  She can take anti-inflammatories twice a day as needed as well.

## 2024-06-21 NOTE — Therapy (Signed)
 OUTPATIENT PHYSICAL THERAPY LOWER EXTREMITY EVALUATION   Patient Name: Denise Austin MRN: [id number] DOB:[birth year], 53 y.o., female Today's Date: 06/23/2024   END OF SESSION:  PT End of Session - 06/23/24 1148     Visit Number 1    Date for PT Re-Evaluation 08/18/24    Authorization Type [facility] [employer]    PT Start Time 1148    PT Stop Time 1229    PT Time Calculation (min) 41 min    Activity Tolerance Patient tolerated treatment well    Behavior During Therapy [facility] for tasks assessed/performed          Past Medical History:  Diagnosis Date   Allergy    Arthritis    GERD (gastroesophageal reflux disease)    Hyperglycemia    Hypertension    OSA (obstructive sleep apnea) 04/15/2018   PONV (postoperative nausea and vomiting)    Pre-diabetes    Sleep apnea    wears cpap    Uterine fibroid    Past Surgical History:  Procedure Laterality Date   BUNIONECTOMY     Lt-01/07/06, Rt-09/15/07   CHOLECYSTECTOMY     laparoscopic   CYST REMOVAL HAND     Left Wrist   HERNIA REPAIR  1976   umbilical   LUMBAR LAMINECTOMY/DECOMPRESSION MICRODISCECTOMY Right 04/22/2017   Procedure: Extraforaminal Microdiscectomy - Lumbar two-Lumbar three - right;  Surgeon: Joshua Alm RAMAN, MD;  Location: [facility] OR;  Service: Neurosurgery;  Laterality: Right;   lumbar surgery revision  04/24/2017   TOTAL HIP ARTHROPLASTY Right 06/03/2024   Procedure: ARTHROPLASTY, HIP, TOTAL, ANTERIOR APPROACH;  Surgeon: Vernetta Lonni GRADE, MD;  Location: [facility] ORS;  Service: Orthopedics;  Laterality: Right;   WOUND EXPLORATION N/A 05/22/2017   Procedure: Revision Of Lumbar Wound;  Surgeon: Joshua Alm RAMAN, MD;  Location: [facility] OR;  Service: Neurosurgery;  Laterality: N/A;  Lumbar wound revision   Patient Active Problem List   Diagnosis Date Noted   Status post total replacement of right hip 06/03/2024   Migraine without status migrainosus, not intractable 05/06/2023   Symptomatic mammary hypertrophy 12/02/2021   Neck  pain 12/02/2021   Morbid obesity due to excess calories (HCC) 04/21/2019   OSA (obstructive sleep apnea) 04/15/2018   Herpes simplex viral infection 07/10/2017   S/P lumbar laminectomy 04/22/2017   GERD (gastroesophageal reflux disease) 07/25/2016   Prediabetes 07/25/2016   HTN (hypertension) 12/24/2015   Preventative health care 10/16/2014   CMC arthritis 07/06/2014   S/P excision of ganglion cyst 07/06/2014   Synovitis 07/06/2014    PCP: Daryl Setter, NP   REFERRING PROVIDER: Vernetta Lonni GRADE, MD  REFERRING DIAG: [phone] (ICD-10-CM) - Status post right hip replacement   We will set her up for outpatient physical therapy at [facility] for transitioning from a walker to a cane to hopefully eventually ambulating without any assist device. They can work on her balance and coordination as well.   THERAPY DIAG:  Stiffness of right hip, not elsewhere classified  Pain in right hip  Muscle weakness (generalized)  Other abnormalities of gait and mobility  Localized edema  RATIONALE FOR EVALUATION AND TREATMENT: Rehabilitation  ONSET DATE: 06/03/2024  NEXT MD VISIT: 07/14/2024   SUBJECTIVE:  SUBJECTIVE STATEMENT: Pt reports things started out a little rocky after surgery but now moving better. No longer needs assist with showering but still needs assist with putting on shoes. Has not started any housework yet. Still doing hospital HEP.  She tries to get up every hour and walk. She switched from her RW to her SPC 2 days ago.  PAIN: Are you having pain? Yes: NPRS scale: 0/10 currently, up to 5/10 when she pushes herself too much  Pain location: R anterior upper hip  Pain description: throbbing, aching pain  Aggravating factors: doing too much with exercises or  activity  Relieving factors: sitting or lying down, Advil  PRN more than prescription meds   PERTINENT HISTORY:  multiple back surgeries - lumbar laminectomy/decompression L2-3 right, OA, GERD, HTN, OSA, prediabetes, morbid obesity, migraine, symptomatic mammary hypertrophy, neck pain  PRECAUTIONS: None  RED FLAGS: None  WEIGHT BEARING RESTRICTIONS: No  FALLS:  Has patient fallen in last 6 months? Yes. 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  Get back to work.   OBJECTIVE: (objective measures completed at initial evaluation unless otherwise dated)  DIAGNOSTIC FINDINGS:  02/06/24 - MR OF THE RIGHT HIP WITHOUT CONTRAST  IMPRESSION: 1. Severe degenerative tearing of the right anterior and superior labrum. Moderate partial-thickness cartilage loss of the right femoral head and acetabulum. 2. No hip fracture, dislocation or avascular necrosis.  PATIENT SURVEYS:  LEFS  Extreme difficulty/unable (0), Quite a bit of difficulty (1), Moderate difficulty (2), Little difficulty (3), No difficulty (4) Survey date:  06/23/24  Any of your usual work, housework or school activities 0  2. Usual hobbies, recreational or sporting activities 0  3. Getting into/out of the bath 4  4. Walking between rooms 4  5. Putting on socks/shoes 2  6. Squatting  0  7. Lifting an object, like a bag of groceries from the floor 0  8. Performing light activities around your home 2  9. Performing heavy activities around your home 0  10. Getting into/out  of a car 4  11. Walking 2 blocks 0  12. Walking 1 mile 0  13. Going up/down 10 stairs (1 flight) 1  14. Standing for 1 hour 1  15.  sitting for 1 hour 4  16. Running on even ground 0  17. Running on uneven ground 0  18. Making sharp turns while running fast 0  19. Hopping  4  20. Rolling over in bed 4  Score total:  30 / 80 = 37.5 %  Functional limitation: Moderate    COGNITION: Overall cognitive status: Within functional limits for tasks assessed    SENSATION: WFL Except some numbness lateral to incision  POSTURE:  weight shift left  MUSCLE LENGTH: Hamstrings: Mild/mod tight B ITB: Mild tight B Piriformis: Mild/mod tight B Hip flexors: NT Quads: NT Heelcord: NT  LOWER EXTREMITY ROM:  A/PROM Right eval Left eval  Hip flexion 61 / 89 94 / 109  Hip extension    Hip abduction    Hip adduction    Hip internal  rotation    Hip external rotation      LOWER EXTREMITY MMT:  MMT Right eval Left eval  Hip flexion 3- 4+  Hip extension 4- 4  Hip abduction 4- sitting 4 sitting  Hip adduction 4 sitting 4+ sitting  Hip internal rotation 3 4+  Hip external rotation 3 4+  Knee flexion 4+ 5  Knee extension 5 5  Ankle dorsiflexion 5 5  Ankle plantarflexion    Ankle inversion    Ankle eversion    (Blank rows = not tested)  FUNCTIONAL TESTS:  5 times sit to stand: 16.10 sec w/o UE assist Timed up and go (TUG): 12.88 sec with SPC  GAIT: Distance walked: clinic distances ~110 ft Assistive device utilized: Single point cane Level of assistance: Modified independence and SBA Gait pattern: step through pattern, decreased stance time- Right, and lateral lean- Left Comments: Pt initially using cane in R hand - provided education on reciprocal step pattern using SPC in L hand to offset R foot/LE   TODAY'S TREATMENT:   06/21/2024  SELF CARE:  Reviewed eval findings and role of PT in addressing identified deficits as well as instruction in initial HEP (see below) and proper  gait pattern with SPC.    PATIENT EDUCATION:  Education details: PT eval findings, anticipated POC, initial HEP, and gait safety with SPC  Person educated: Patient Education method: Explanation, Demonstration, Verbal cues, and MedBridgeGO app access provided Education comprehension: verbalized understanding, returned demonstration, verbal cues required, and needs further education  HOME EXERCISE PROGRAM: Access Code: [email or web address] URL: [email or web address] Date: 06/23/2024 Prepared by: Elijah Hidden  Exercises - Heel Toe Raises with Counter Support  - 1 x daily - 7 x weekly - 2 sets - 10 reps - 3 sec hold - Standing Hip Abduction with Counter Support  - 1 x daily - 7 x weekly - 2 sets - 10 reps - 2-3 sec hold - Standing Hip Extension with Counter Support  - 1 x daily - 7 x weekly - 2 sets - 10 reps - 2-3 sec hold - Standing Hip Flexion AROM  - 1 x daily - 7 x weekly - 2 sets - 10 reps - 3 sec hold   ASSESSMENT:  CLINICAL IMPRESSION: AMERIKA NOURSE is a 53 y.o. female who was referred to physical therapy for evaluation and treatment s/p R THA on 06/03/24.  Patient reports initial postop course was a bit rocky but doing better now.  No pain except when she overexerts herself.  Patient has deficits in R hip ROM, B LE flexibility, B LE strength, abnormal posture, and altered gait pattern which are interfering with ADLs and are impacting quality of life.  On LEFS patient scored 30/80 demonstrating moderate functional limitation.  Lorraine will benefit from skilled PT to address above deficits to improve mobility and activity tolerance with decreased pain interference.  OBJECTIVE IMPAIRMENTS: Abnormal gait, decreased activity tolerance, decreased balance, decreased knowledge of condition, decreased knowledge of use of DME, decreased mobility, difficulty walking, decreased ROM, decreased strength, increased fascial restrictions, impaired perceived functional ability, increased  muscle spasms, impaired flexibility, improper body mechanics, postural dysfunction, and pain.   ACTIVITY LIMITATIONS: carrying, lifting, bending, standing, squatting, sleeping, stairs, transfers, bed mobility, dressing, and locomotion level  PARTICIPATION LIMITATIONS: meal prep, cleaning, laundry, driving, shopping, community activity, and occupation  PERSONAL FACTORS: Fitness, Past/current experiences, Time since onset of injury/illness/exacerbation, and 3+ comorbidities: multiple back surgeries - lumbar laminectomy/decompression L2-3 right, OA, GERD, HTN, OSA, prediabetes, morbid obesity,  migraine, symptomatic mammary hypertrophy, neck pain are also affecting patient's functional outcome.   REHAB POTENTIAL: Good  CLINICAL DECISION MAKING: Evolving/moderate complexity  EVALUATION COMPLEXITY: Moderate   GOALS: Goals reviewed with patient? Yes  SHORT TERM GOALS: Target date: 07/21/2024  Patient will be independent with initial HEP. Baseline: Initial HEP provided on eval Goal status: INITIAL  2.  Patient will report at least 25% improvement in R hip pain to improve QOL. Baseline: Up to 5/10 with overexertion Goal status: INITIAL  LONG TERM GOALS: Target date: 08/18/2024  Patient will be independent with advanced/ongoing HEP to improve outcomes and carryover.  Baseline:  Goal status: INITIAL  2.  Patient will report at least 50-75% improvement in R hip pain to improve QOL. Baseline: Up to 5/10 with overexertion Goal status: INITIAL  3.  Patient will demonstrate improved B LE strength to >/= 4+/5 for improved stability and ease of mobility. Baseline: Refer to above LE MMT table Goal status: INITIAL  4.  Patient will be able to ambulate 600' with LRAD and normal gait pattern without increased pain to access community.  Baseline: ~110 ft with SPC - cues necessary to switch cane to L hand to promote proper reciprocal pattern Goal status: INITIAL  5.  Patient will report >/=  40/80 on LEFS (MCID = 9 pts) to demonstrate improved functional ability. Baseline: 30 / 80 = 37.5 % Goal status: INITIAL  6.  Patient will demonstrate at least 19/24 on DGI to decrease risk of falls. Baseline: TBA Goal status: INITIAL    PLAN:  PT FREQUENCY: 2x/week  PT DURATION: 8 weeks  PLANNED INTERVENTIONS: 97164- PT Re-evaluation, 97750- Physical Performance Testing, 97110-Therapeutic exercises, 97530- Therapeutic activity, 97112- Neuromuscular re-education, 97535- Self Care, [id number]- Manual therapy, [phone]- Gait training, [phone]- Aquatic Therapy, [phone]- Electrical stimulation (unattended), 97035- Ultrasound, 79439 (1-2 muscles), [phone] (3+ muscles)- Dry Needling, Patient/Family education, Balance training, Stair training, Taping, Joint mobilization, Scar mobilization, DME instructions, Cryotherapy, and Moist heat  PLAN FOR NEXT SESSION: Review initial HEP; review of gait training with SPC as indicated; gently progress R hip ROM and LE strengthening; balance training   Elijah CHRISTELLA Hidden, PT 06/23/2024, 1:44 PM

## 2024-06-22 ENCOUNTER — Telehealth: Payer: Self-pay | Admitting: Radiology

## 2024-06-22 NOTE — Telephone Encounter (Signed)
 Faxed and emailed forms to [employer]  F: [phone] Email: [email or web address]  CLAIM# [id number]

## 2024-06-23 ENCOUNTER — Other Ambulatory Visit: Payer: Self-pay

## 2024-06-23 ENCOUNTER — Encounter: Payer: Self-pay | Admitting: Physical Therapy

## 2024-06-23 ENCOUNTER — Ambulatory Visit: Attending: Orthopaedic Surgery | Admitting: Physical Therapy

## 2024-06-23 DIAGNOSIS — R600 Localized edema: Secondary | ICD-10-CM | POA: Insufficient documentation

## 2024-06-23 DIAGNOSIS — Z96641 Presence of right artificial hip joint: Secondary | ICD-10-CM | POA: Diagnosis not present

## 2024-06-23 DIAGNOSIS — R2689 Other abnormalities of gait and mobility: Secondary | ICD-10-CM | POA: Insufficient documentation

## 2024-06-23 DIAGNOSIS — M25651 Stiffness of right hip, not elsewhere classified: Secondary | ICD-10-CM | POA: Insufficient documentation

## 2024-06-23 DIAGNOSIS — M6281 Muscle weakness (generalized): Secondary | ICD-10-CM | POA: Insufficient documentation

## 2024-06-23 DIAGNOSIS — M25551 Pain in right hip: Secondary | ICD-10-CM | POA: Insufficient documentation

## 2024-06-28 ENCOUNTER — Telehealth: Payer: Self-pay | Admitting: Family

## 2024-06-28 ENCOUNTER — Other Ambulatory Visit (HOSPITAL_BASED_OUTPATIENT_CLINIC_OR_DEPARTMENT_OTHER): Payer: Self-pay

## 2024-06-28 NOTE — Telephone Encounter (Signed)
 See mychart.

## 2024-06-29 ENCOUNTER — Encounter: Payer: Self-pay | Admitting: Orthopaedic Surgery

## 2024-06-29 ENCOUNTER — Other Ambulatory Visit (HOSPITAL_COMMUNITY): Payer: Self-pay

## 2024-06-29 ENCOUNTER — Other Ambulatory Visit: Payer: Self-pay

## 2024-06-29 ENCOUNTER — Encounter (HOSPITAL_COMMUNITY): Payer: Self-pay

## 2024-06-29 MED ORDER — AMOXICILLIN 500 MG PO CAPS
ORAL_CAPSULE | ORAL | 0 refills | Status: DC
  Filled 2024-06-29 – 2024-06-30 (×2): qty 4, 1d supply, fill #0

## 2024-06-30 ENCOUNTER — Encounter: Payer: Self-pay | Admitting: Physical Therapy

## 2024-06-30 ENCOUNTER — Other Ambulatory Visit (HOSPITAL_COMMUNITY): Payer: Self-pay

## 2024-06-30 ENCOUNTER — Other Ambulatory Visit (HOSPITAL_BASED_OUTPATIENT_CLINIC_OR_DEPARTMENT_OTHER): Payer: Self-pay

## 2024-06-30 ENCOUNTER — Ambulatory Visit: Attending: Orthopaedic Surgery | Admitting: Physical Therapy

## 2024-06-30 DIAGNOSIS — R600 Localized edema: Secondary | ICD-10-CM | POA: Diagnosis not present

## 2024-06-30 DIAGNOSIS — M25551 Pain in right hip: Secondary | ICD-10-CM | POA: Diagnosis not present

## 2024-06-30 DIAGNOSIS — R2689 Other abnormalities of gait and mobility: Secondary | ICD-10-CM | POA: Diagnosis not present

## 2024-06-30 DIAGNOSIS — M6281 Muscle weakness (generalized): Secondary | ICD-10-CM | POA: Insufficient documentation

## 2024-06-30 DIAGNOSIS — M25651 Stiffness of right hip, not elsewhere classified: Secondary | ICD-10-CM | POA: Insufficient documentation

## 2024-06-30 NOTE — Therapy (Signed)
 OUTPATIENT PHYSICAL THERAPY TREATMENT   Patient Name: Denise Austin MRN: [id number] DOB:[birth year], 53 y.o., female Today's Date: 06/30/2024   END OF SESSION:  PT End of Session - 06/30/24 0936     Visit Number 2    Date for PT Re-Evaluation 08/18/24    Authorization Type [facility] [employer]    PT Start Time [phone]    PT Stop Time 1020    PT Time Calculation (min) 44 min    Activity Tolerance Patient tolerated treatment well    Behavior During Therapy [facility] for tasks assessed/performed           Past Medical History:  Diagnosis Date   Allergy    Arthritis    GERD (gastroesophageal reflux disease)    Hyperglycemia    Hypertension    OSA (obstructive sleep apnea) 04/15/2018   PONV (postoperative nausea and vomiting)    Pre-diabetes    Sleep apnea    wears cpap    Uterine fibroid    Past Surgical History:  Procedure Laterality Date   BUNIONECTOMY     Lt-01/07/06, Rt-09/15/07   CHOLECYSTECTOMY     laparoscopic   CYST REMOVAL HAND     Left Wrist   HERNIA REPAIR  1976   umbilical   LUMBAR LAMINECTOMY/DECOMPRESSION MICRODISCECTOMY Right 04/22/2017   Procedure: Extraforaminal Microdiscectomy - Lumbar two-Lumbar three - right;  Surgeon: Joshua Alm RAMAN, MD;  Location: [facility] OR;  Service: Neurosurgery;  Laterality: Right;   lumbar surgery revision  04/24/2017   TOTAL HIP ARTHROPLASTY Right 06/03/2024   Procedure: ARTHROPLASTY, HIP, TOTAL, ANTERIOR APPROACH;  Surgeon: Vernetta Lonni GRADE, MD;  Location: [facility] ORS;  Service: Orthopedics;  Laterality: Right;   WOUND EXPLORATION N/A 05/22/2017   Procedure: Revision Of Lumbar Wound;  Surgeon: Joshua Alm RAMAN, MD;  Location: [facility] OR;  Service: Neurosurgery;  Laterality: N/A;  Lumbar wound revision   Patient Active Problem List   Diagnosis Date Noted   Status post total replacement of right hip 06/03/2024   Migraine without status migrainosus, not intractable 05/06/2023   Symptomatic mammary hypertrophy 12/02/2021   Neck pain 12/02/2021    Morbid obesity due to excess calories (HCC) 04/21/2019   OSA (obstructive sleep apnea) 04/15/2018   Herpes simplex viral infection 07/10/2017   S/P lumbar laminectomy 04/22/2017   GERD (gastroesophageal reflux disease) 07/25/2016   Prediabetes 07/25/2016   HTN (hypertension) 12/24/2015   Preventative health care 10/16/2014   CMC arthritis 07/06/2014   S/P excision of ganglion cyst 07/06/2014   Synovitis 07/06/2014    PCP: Daryl Setter, NP   REFERRING PROVIDER: Vernetta Lonni GRADE, MD  REFERRING DIAG: [phone] (ICD-10-CM) - Status post right hip replacement   We will set her up for outpatient physical therapy at [facility] for transitioning from a walker to a cane to hopefully eventually ambulating without any assist device. They can work on her balance and coordination as well.   THERAPY DIAG:  Stiffness of right hip, not elsewhere classified  Pain in right hip  Muscle weakness (generalized)  Other abnormalities of gait and mobility  Localized edema  RATIONALE FOR EVALUATION AND TREATMENT: Rehabilitation  ONSET DATE: 06/03/2024  NEXT MD VISIT: 07/14/2024   SUBJECTIVE:  SUBJECTIVE STATEMENT: Pt reports her pain has been better all week.  Just typically has some stiffness in the morning which subsides as she gets moving.  Denies any issues with HEP.  EVAL: Pt reports things started out a little rocky after surgery but now moving better. No longer needs assist with showering but still needs assist with putting on shoes. Has not started any housework yet. Still doing hospital HEP.  She tries to get up every hour and walk. She switched from her RW to her SPC 2 days ago.  PAIN: Are you having pain? Yes: NPRS scale: 0/10   Pain location: R anterior upper hip   Pain description: throbbing, aching pain  Aggravating factors: doing too much with exercises or activity  Relieving factors: sitting or lying down, Advil  PRN more than prescription meds   PERTINENT HISTORY:  multiple back surgeries - lumbar laminectomy/decompression L2-3 right, OA, GERD, HTN, OSA, prediabetes, morbid obesity, migraine, symptomatic mammary hypertrophy, neck pain  PRECAUTIONS: None  RED FLAGS: None  WEIGHT BEARING RESTRICTIONS: No  FALLS:  Has patient fallen in last 6 months? Yes. 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  Get back to work.   OBJECTIVE: (objective measures completed at initial evaluation unless otherwise dated)  DIAGNOSTIC FINDINGS:  02/06/24 - MR OF THE RIGHT HIP WITHOUT CONTRAST  IMPRESSION: 1. Severe degenerative tearing of the right anterior and superior labrum. Moderate partial-thickness cartilage loss of the right femoral head and acetabulum. 2. No hip fracture, dislocation or avascular necrosis.  PATIENT SURVEYS:  LEFS  Extreme difficulty/unable (0), Quite a bit of difficulty (1), Moderate difficulty (2), Little difficulty (3), No difficulty (4) Survey date:  06/23/24  Any of your usual work, housework or school activities 0  2. Usual hobbies, recreational or sporting activities 0  3. Getting into/out of the bath 4  4. Walking between rooms 4  5. Putting on socks/shoes 2  6. Squatting  0  7. Lifting an object, like a bag of groceries from the floor 0  8. Performing light  activities around your home 2  9. Performing heavy activities around your home 0  10. Getting into/out of a car 4  11. Walking 2 blocks 0  12. Walking 1 mile 0  13. Going up/down 10 stairs (1 flight) 1  14. Standing for 1 hour 1  15.  sitting for 1 hour 4  16. Running on even ground 0  17. Running on uneven ground 0  18. Making sharp turns while running fast 0  19. Hopping  4  20. Rolling over in bed 4  Score total:  30 / 80 = 37.5 %  Functional limitation: Moderate    COGNITION: Overall cognitive status: Within functional limits for tasks assessed    SENSATION: WFL Except some numbness lateral to incision  POSTURE:  weight shift left  MUSCLE LENGTH: Hamstrings: Mild/mod tight B ITB: Mild tight B Piriformis: Mild/mod tight B Hip flexors: NT Quads: NT Heelcord: NT  LOWER EXTREMITY ROM:  A/PROM Right eval Left eval  Hip flexion 61 /  89 94 / 109  Hip extension    Hip abduction    Hip adduction    Hip internal rotation    Hip external rotation      LOWER EXTREMITY MMT:  MMT Right eval Left eval  Hip flexion 3- 4+  Hip extension 4- 4  Hip abduction 4- sitting 4 sitting  Hip adduction 4 sitting 4+ sitting  Hip internal rotation 3 4+  Hip external rotation 3 4+  Knee flexion 4+ 5  Knee extension 5 5  Ankle dorsiflexion 5 5  Ankle plantarflexion    Ankle inversion    Ankle eversion    (Blank rows = not tested)  FUNCTIONAL TESTS:  5 times sit to stand: 16.10 sec w/o UE assist Timed up and go (TUG): 12.88 sec with SPC  GAIT: Distance walked: clinic distances ~110 ft Assistive device utilized: Single point cane Level of assistance: Modified independence and SBA Gait pattern: step through pattern, decreased stance time- Right, and lateral lean- Left Comments: Pt initially using cane in R hand - provided education on reciprocal step pattern using SPC in L hand to offset R foot/LE   TODAY'S TREATMENT:   06/30/2024  THERAPEUTIC EXERCISE: To improve strength  and endurance.  Demonstration, verbal and tactile cues throughout for technique.  NuStep - L3 x 6 min (UE/LE) Hooklying TrA + RTB alt unilateral bent-knee fallout 2 x 10 L S/L RTB R clam 2 x 10 Attempted in R S/L but too uncomfortable Bridge + RTB hip ABD isometric 2 x 10 Hooklying TrA + RTB hip flexion march 2 x 10 Hooklying TrA + hip ADD ball squeeze isometric 10 x 5, 2 sets  GAIT TRAINING: To normalize gait pattern and prepare to wean from AD. 90' ft x 2 - 1 lap each with and w/o SPC - even stride length but slight lateral lean to L when using SPC, although not evident w/o AD  NEUROMUSCULAR RE-EDUCATION: To improve balance, coordination, kinesthesia, posture, and proprioception. Heel-toe raises with counter support x 20 Standing hip abduction 2 x 10, 2nd set with looped YTB at ankles; UE support on counter for balance  Standing hip extension  2 x 10, 2nd set with looped YTB at ankles; UE support on counter for balance  Standing hip flexion SLR x 10 Standing hip flexion march with looped YTB at midfeet x 10   06/21/2024  SELF CARE:  Reviewed eval findings and role of PT in addressing identified deficits as well as instruction in initial HEP (see below) and proper gait pattern with SPC.    PATIENT EDUCATION:  Education details: PT eval findings, anticipated POC, initial HEP, and gait safety with SPC  Person educated: Patient Education method: Explanation, Demonstration, Verbal cues, and [email or web address] app access provided Education comprehension: verbalized understanding, returned demonstration, verbal cues required, and needs further education  HOME EXERCISE PROGRAM: Access Code: [email or web address] URL: [email or web address] Date: 06/30/2024 Prepared by: Elijah Hidden  Exercises - Heel Toe Raises with Counter Support  - 1 x daily - 7 x weekly - 2 sets - 10 reps - 3 sec hold - Standing Hip Abduction with Resistance at Ankles and Unilateral Counter Support  - 1 x daily - 7 x  weekly - 2 sets - 10 reps - 3 sec hold - Standing Hip Extension with Resistance at Ankles and Counter Support  - 1 x daily - 7 x weekly - 2 sets - 10 reps - 3 sec hold - Marching with Resistance  - 1 x  daily - 7 x weekly - 2 sets - 10 reps - 3 sec hold - Supine Hip Adduction Isometric with Ball  - 2 x daily - 7 x weekly - 2 sets - 10 reps - 5 sec hold - Clam with Resistance  - 1 x daily - 7 x weekly - 2 sets - 10 reps - 3 sec hold - Bridge with Resistance  - 1 x daily - 7 x weekly - 2 sets - 10 reps - 5 sec hold   ASSESSMENT:  CLINICAL IMPRESSION: Phoebe reports decreasing pain over the past week with no pain today.  She notes she has found herself walking off w/o her cane some of late, therefore assessed gait pattern both with and w/o SPC with better sequencing observed with SPC, and no significant gait deviations noted.  Patient cleared to walk around in home w/o AD, but encouraged ongoing SPC use for community/longer distances.  Progressed core/proximal LE strengthening adding YTB resistance at ankles with standing hip exercises and introducing supine hip strengthening.  All exercises well tolerated, therefore HEP updated accordingly.  Adler will benefit from continued skilled PT to address ongoing flexibility, ROM, strength and balance deficits to improve mobility and activity tolerance with decreased pain interference.   EVAL:  RONNIKA COLLETT is a 53 y.o. female who was referred to physical therapy for evaluation and treatment s/p R THA on 06/03/24.  Patient reports initial postop course was a bit rocky but doing better now.  No pain except when she overexerts herself.  Patient has deficits in R hip ROM, B LE flexibility, B LE strength, abnormal posture, and altered gait pattern which are interfering with ADLs and are impacting quality of life.  On LEFS patient scored 30/80 demonstrating moderate functional limitation.  Zunairah will benefit from skilled PT to address above deficits to  improve mobility and activity tolerance with decreased pain interference.  OBJECTIVE IMPAIRMENTS: Abnormal gait, decreased activity tolerance, decreased balance, decreased knowledge of condition, decreased knowledge of use of DME, decreased mobility, difficulty walking, decreased ROM, decreased strength, increased fascial restrictions, impaired perceived functional ability, increased muscle spasms, impaired flexibility, improper body mechanics, postural dysfunction, and pain.   ACTIVITY LIMITATIONS: carrying, lifting, bending, standing, squatting, sleeping, stairs, transfers, bed mobility, dressing, and locomotion level  PARTICIPATION LIMITATIONS: meal prep, cleaning, laundry, driving, shopping, community activity, and occupation  PERSONAL FACTORS: Fitness, Past/current experiences, Time since onset of injury/illness/exacerbation, and 3+ comorbidities: multiple back surgeries - lumbar laminectomy/decompression L2-3 right, OA, GERD, HTN, OSA, prediabetes, morbid obesity, migraine, symptomatic mammary hypertrophy, neck pain are also affecting patient's functional outcome.   REHAB POTENTIAL: Good  CLINICAL DECISION MAKING: Evolving/moderate complexity  EVALUATION COMPLEXITY: Moderate   GOALS: Goals reviewed with patient? Yes  SHORT TERM GOALS: Target date: 07/21/2024  Patient will be independent with initial HEP. Baseline: Initial HEP provided on eval Goal status: MET - 06/30/24   2.  Patient will report at least 25% improvement in R hip pain to improve QOL. Baseline: Up to 5/10 with overexertion Goal status: INITIAL  LONG TERM GOALS: Target date: 08/18/2024  Patient will be independent with advanced/ongoing HEP to improve outcomes and carryover.  Baseline:  Goal status: INITIAL  2.  Patient will report at least 50-75% improvement in R hip pain to improve QOL. Baseline: Up to 5/10 with overexertion Goal status: INITIAL  3.  Patient will demonstrate improved B LE strength to >/= 4+/5  for improved stability and ease of mobility. Baseline: Refer to above LE MMT  table Goal status: INITIAL  4.  Patient will be able to ambulate 600' with LRAD and normal gait pattern without increased pain to access community.  Baseline: ~110 ft with SPC - cues necessary to switch cane to L hand to promote proper reciprocal pattern Goal status: INITIAL  5.  Patient will report >/= 40/80 on LEFS (MCID = 9 pts) to demonstrate improved functional ability. Baseline: 30 / 80 = 37.5 % Goal status: INITIAL  6.  Patient will demonstrate at least 19/24 on DGI to decrease risk of falls. Baseline: TBA Goal status: INITIAL    PLAN:  PT FREQUENCY: 2x/week  PT DURATION: 8 weeks  PLANNED INTERVENTIONS: 97164- PT Re-evaluation, 97750- Physical Performance Testing, 97110-Therapeutic exercises, 97530- Therapeutic activity, 97112- Neuromuscular re-education, 97535- Self Care, [id number]- Manual therapy, [phone]- Gait training, [phone]- Aquatic Therapy, [phone]- Electrical stimulation (unattended), 97035- Ultrasound, 79439 (1-2 muscles), [id number] (3+ muscles)- Dry Needling, Patient/Family education, Balance training, Stair training, Taping, Joint mobilization, Scar mobilization, DME instructions, Cryotherapy, and Moist heat  PLAN FOR NEXT SESSION: Review of gait training with SPC or no AD as indicated; gently progress R hip ROM and LE strengthening; balance training; review/update HEP PRN   Elijah CHRISTELLA Hidden, PT 06/30/2024, 3:10 PM

## 2024-07-05 ENCOUNTER — Ambulatory Visit

## 2024-07-05 DIAGNOSIS — R600 Localized edema: Secondary | ICD-10-CM

## 2024-07-05 DIAGNOSIS — R2689 Other abnormalities of gait and mobility: Secondary | ICD-10-CM | POA: Diagnosis not present

## 2024-07-05 DIAGNOSIS — M25551 Pain in right hip: Secondary | ICD-10-CM | POA: Diagnosis not present

## 2024-07-05 DIAGNOSIS — M25651 Stiffness of right hip, not elsewhere classified: Secondary | ICD-10-CM | POA: Diagnosis not present

## 2024-07-05 DIAGNOSIS — M6281 Muscle weakness (generalized): Secondary | ICD-10-CM

## 2024-07-05 NOTE — Therapy (Signed)
 OUTPATIENT PHYSICAL THERAPY TREATMENT   Patient Name: Denise Austin MRN: [id number] DOB:Apr 06, 1971, 53 y.o., female Today's Date: 07/05/2024   END OF SESSION:  PT End of Session - 07/05/24 1317     Visit Number 3    Date for PT Re-Evaluation 08/18/24    Authorization Type [facility] [employer]    PT Start Time 1315    PT Stop Time 1355    PT Time Calculation (min) 40 min    Activity Tolerance Patient tolerated treatment well    Behavior During Therapy [facility] for tasks assessed/performed           Past Medical History:  Diagnosis Date   Allergy    Arthritis    GERD (gastroesophageal reflux disease)    Hyperglycemia    Hypertension    OSA (obstructive sleep apnea) 04/15/2018   PONV (postoperative nausea and vomiting)    Pre-diabetes    Sleep apnea    wears cpap    Uterine fibroid    Past Surgical History:  Procedure Laterality Date   BUNIONECTOMY     Lt-01/07/06, Rt-09/15/07   CHOLECYSTECTOMY     laparoscopic   CYST REMOVAL HAND     Left Wrist   HERNIA REPAIR  1976   umbilical   LUMBAR LAMINECTOMY/DECOMPRESSION MICRODISCECTOMY Right 04/22/2017   Procedure: Extraforaminal Microdiscectomy - Lumbar two-Lumbar three - right;  Surgeon: Joshua Alm RAMAN, MD;  Location: [facility] OR;  Service: Neurosurgery;  Laterality: Right;   lumbar surgery revision  04/24/2017   TOTAL HIP ARTHROPLASTY Right 06/03/2024   Procedure: ARTHROPLASTY, HIP, TOTAL, ANTERIOR APPROACH;  Surgeon: Vernetta Lonni GRADE, MD;  Location: [facility] ORS;  Service: Orthopedics;  Laterality: Right;   WOUND EXPLORATION N/A 05/22/2017   Procedure: Revision Of Lumbar Wound;  Surgeon: Joshua Alm RAMAN, MD;  Location: [facility] OR;  Service: Neurosurgery;  Laterality: N/A;  Lumbar wound revision   Patient Active Problem List   Diagnosis Date Noted   Status post total replacement of right hip 06/03/2024   Migraine without status migrainosus, not intractable 05/06/2023   Symptomatic mammary hypertrophy 12/02/2021   Neck pain 12/02/2021    Morbid obesity due to excess calories (HCC) 04/21/2019   OSA (obstructive sleep apnea) 04/15/2018   Herpes simplex viral infection 07/10/2017   S/P lumbar laminectomy 04/22/2017   GERD (gastroesophageal reflux disease) 07/25/2016   Prediabetes 07/25/2016   HTN (hypertension) 12/24/2015   Preventative health care 10/16/2014   CMC arthritis 07/06/2014   S/P excision of ganglion cyst 07/06/2014   Synovitis 07/06/2014    PCP: Daryl Setter, NP   REFERRING PROVIDER: Vernetta Lonni GRADE, MD  REFERRING DIAG: [id number] (ICD-10-CM) - Status post right hip replacement   We will set her up for outpatient physical therapy at [facility] for transitioning from a walker to a cane to hopefully eventually ambulating without any assist device. They can work on her balance and coordination as well.   THERAPY DIAG:  Stiffness of right hip, not elsewhere classified  Pain in right hip  Muscle weakness (generalized)  Other abnormalities of gait and mobility  Localized edema  RATIONALE FOR EVALUATION AND TREATMENT: Rehabilitation  ONSET DATE: 06/03/2024  NEXT MD VISIT: 07/14/2024   SUBJECTIVE:  SUBJECTIVE STATEMENT: Pt doing well today, reports no pain.  EVAL: Pt reports things started out a little rocky after surgery but now moving better. No longer needs assist with showering but still needs assist with putting on shoes. Has not started any housework yet. Still doing hospital HEP.  She tries to get up every hour and walk. She switched from her RW to her SPC 2 days ago.  PAIN: Are you having pain? Yes: NPRS scale: 0/10   Pain location: R anterior upper hip  Pain description: throbbing, aching pain  Aggravating factors: doing too much with exercises or activity  Relieving  factors: sitting or lying down, Advil  PRN more than prescription meds   PERTINENT HISTORY:  multiple back surgeries - lumbar laminectomy/decompression L2-3 right, OA, GERD, HTN, OSA, prediabetes, morbid obesity, migraine, symptomatic mammary hypertrophy, neck pain  PRECAUTIONS: None  RED FLAGS: None  WEIGHT BEARING RESTRICTIONS: No  FALLS:  Has patient fallen in last 6 months? Yes. 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  Get back to work.   OBJECTIVE: (objective measures completed at initial evaluation unless otherwise dated)  DIAGNOSTIC FINDINGS:  02/06/24 - MR OF THE RIGHT HIP WITHOUT CONTRAST  IMPRESSION: 1. Severe degenerative tearing of the right anterior and superior labrum. Moderate partial-thickness cartilage loss of the right femoral head and acetabulum. 2. No hip fracture, dislocation or avascular necrosis.  PATIENT SURVEYS:  LEFS  Extreme difficulty/unable (0), Quite a bit of difficulty (1), Moderate difficulty (2), Little difficulty (3), No difficulty (4) Survey date:  06/23/24  Any of your usual work, housework or school activities 0  2. Usual hobbies, recreational or sporting activities 0  3. Getting into/out of the bath 4  4. Walking between rooms 4  5. Putting on socks/shoes 2  6. Squatting  0  7. Lifting an object, like a bag of groceries from the floor 0  8. Performing light activities around your home 2  9. Performing heavy activities around your home 0  10. Getting into/out of a car 4  11.  Walking 2 blocks 0  12. Walking 1 mile 0  13. Going up/down 10 stairs (1 flight) 1  14. Standing for 1 hour 1  15.  sitting for 1 hour 4  16. Running on even ground 0  17. Running on uneven ground 0  18. Making sharp turns while running fast 0  19. Hopping  4  20. Rolling over in bed 4  Score total:  30 / 80 = 37.5 %  Functional limitation: Moderate    COGNITION: Overall cognitive status: Within functional limits for tasks assessed    SENSATION: WFL Except some numbness lateral to incision  POSTURE:  weight shift left  MUSCLE LENGTH: Hamstrings: Mild/mod tight B ITB: Mild tight B Piriformis: Mild/mod tight B Hip flexors: NT Quads: NT Heelcord: NT  LOWER EXTREMITY ROM:  A/PROM Right eval Left eval  Hip flexion 61 / 89 94 / 109  Hip extension    Hip abduction    Hip adduction    Hip internal  rotation    Hip external rotation      LOWER EXTREMITY MMT:  MMT Right eval Left eval  Hip flexion 3- 4+  Hip extension 4- 4  Hip abduction 4- sitting 4 sitting  Hip adduction 4 sitting 4+ sitting  Hip internal rotation 3 4+  Hip external rotation 3 4+  Knee flexion 4+ 5  Knee extension 5 5  Ankle dorsiflexion 5 5  Ankle plantarflexion    Ankle inversion    Ankle eversion    (Blank rows = not tested)  FUNCTIONAL TESTS:  5 times sit to stand: 16.10 sec w/o UE assist Timed up and go (TUG): 12.88 sec with SPC  GAIT: Distance walked: clinic distances ~110 ft Assistive device utilized: Single point cane Level of assistance: Modified independence and SBA Gait pattern: step through pattern, decreased stance time- Right, and lateral lean- Left Comments: Pt initially using cane in R hand - provided education on reciprocal step pattern using SPC in L hand to offset R foot/LE   TODAY'S TREATMENT:  07/05/24 THERAPEUTIC EXERCISE: To improve strength and endurance.  Demonstration, verbal and tactile cues throughout for technique.  Nustep L4x26min Clamshell RTB RLE  2x10 Supine hip ADD w/ ball squeeze 2x10  NEUROMUSCULAR RE-EDUCATION: To improve balance, coordination, kinesthesia, posture, and proprioception. Standing hip abduction looped YTB at ankles; UE support on counter for balance 2x10 Standing hip extension looped YTB at ankles; UE support on counter for balance  2x10 Standing hip flexion march looped YTB at midfeet 2 x 10 Bridges RTB 2x10 Clock reaching R/L 5x 12 to 6 o'clock Modified SLS with head turns x5 SLS R/L x 10'  06/30/2024  THERAPEUTIC EXERCISE: To improve strength and endurance.  Demonstration, verbal and tactile cues throughout for technique.  NuStep - L3 x 6 min (UE/LE) Hooklying TrA + RTB alt unilateral bent-knee fallout 2 x 10 L S/L RTB R clam 2 x 10 Attempted in R S/L but too uncomfortable Bridge + RTB hip ABD isometric 2 x 10 Hooklying TrA + RTB hip flexion march 2 x 10 Hooklying TrA + hip ADD ball squeeze isometric 10 x 5, 2 sets  GAIT TRAINING: To normalize gait pattern and prepare to wean from AD. 90' ft x 2 - 1 lap each with and w/o SPC - even stride length but slight lateral lean to L when using SPC, although not evident w/o AD  NEUROMUSCULAR RE-EDUCATION: To improve balance, coordination, kinesthesia, posture, and proprioception. Heel-toe raises with counter support x 20 Standing hip abduction 2 x 10, 2nd set with looped YTB at ankles; UE support on counter for balance  Standing hip extension  2 x 10, 2nd set with looped YTB at ankles; UE support on counter for balance  Standing hip flexion SLR x 10 Standing hip flexion march with looped YTB at midfeet x 10   06/21/2024  SELF CARE:  Reviewed eval findings and role of PT in addressing identified deficits as well as instruction in initial HEP (see below) and proper gait pattern with SPC.    PATIENT EDUCATION:  Education details: PT eval findings, anticipated POC, initial HEP, and gait safety with SPC  Person educated: Patient Education method: Explanation,  Demonstration, Verbal cues, and MedBridgeGO app access provided Education comprehension: verbalized understanding, returned demonstration, verbal cues required, and needs further education  HOME EXERCISE PROGRAM: Access Code: [email or web address] URL: [email or web address] Date: 06/30/2024 Prepared by: Elijah Hidden  Exercises - Heel Toe Raises with Counter Support  - 1 x daily - 7  x weekly - 2 sets - 10 reps - 3 sec hold - Standing Hip Abduction with Resistance at Ankles and Unilateral Counter Support  - 1 x daily - 7 x weekly - 2 sets - 10 reps - 3 sec hold - Standing Hip Extension with Resistance at Ankles and Counter Support  - 1 x daily - 7 x weekly - 2 sets - 10 reps - 3 sec hold - Marching with Resistance  - 1 x daily - 7 x weekly - 2 sets - 10 reps - 3 sec hold - Supine Hip Adduction Isometric with Ball  - 2 x daily - 7 x weekly - 2 sets - 10 reps - 5 sec hold - Clam with Resistance  - 1 x daily - 7 x weekly - 2 sets - 10 reps - 3 sec hold - Bridge with Resistance  - 1 x daily - 7 x weekly - 2 sets - 10 reps - 5 sec hold   ASSESSMENT:  CLINICAL IMPRESSION: Pt able to tolerated the progression of interventions well. Added RTB for ankle strengthening exercises in standing for HEP. Balance deficits noted on L>R today. Lachina will benefit from continued skilled PT to address ongoing flexibility, ROM, strength and balance deficits to improve mobility and activity tolerance with decreased pain interference.   EVAL:  Denise Austin is a 53 y.o. female who was referred to physical therapy for evaluation and treatment s/p R THA on 06/03/24.  Patient reports initial postop course was a bit rocky but doing better now.  No pain except when she overexerts herself.  Patient has deficits in R hip ROM, B LE flexibility, B LE strength, abnormal posture, and altered gait pattern which are interfering with ADLs and are impacting quality of life.  On LEFS patient scored 30/80 demonstrating  moderate functional limitation.  Jaydalynn will benefit from skilled PT to address above deficits to improve mobility and activity tolerance with decreased pain interference.  OBJECTIVE IMPAIRMENTS: Abnormal gait, decreased activity tolerance, decreased balance, decreased knowledge of condition, decreased knowledge of use of DME, decreased mobility, difficulty walking, decreased ROM, decreased strength, increased fascial restrictions, impaired perceived functional ability, increased muscle spasms, impaired flexibility, improper body mechanics, postural dysfunction, and pain.   ACTIVITY LIMITATIONS: carrying, lifting, bending, standing, squatting, sleeping, stairs, transfers, bed mobility, dressing, and locomotion level  PARTICIPATION LIMITATIONS: meal prep, cleaning, laundry, driving, shopping, community activity, and occupation  PERSONAL FACTORS: Fitness, Past/current experiences, Time since onset of injury/illness/exacerbation, and 3+ comorbidities: multiple back surgeries - lumbar laminectomy/decompression L2-3 right, OA, GERD, HTN, OSA, prediabetes, morbid obesity, migraine, symptomatic mammary hypertrophy, neck pain are also affecting patient's functional outcome.   REHAB POTENTIAL: Good  CLINICAL DECISION MAKING: Evolving/moderate complexity  EVALUATION COMPLEXITY: Moderate   GOALS: Goals reviewed with patient? Yes  SHORT TERM GOALS: Target date: 07/21/2024  Patient will be independent with initial HEP. Baseline: Initial HEP provided on eval Goal status: MET - 06/30/24   2.  Patient will report at least 25% improvement in R hip pain to improve QOL. Baseline: Up to 5/10 with overexertion Goal status: MET- 07/05/24  LONG TERM GOALS: Target date: 08/18/2024  Patient will be independent with advanced/ongoing HEP to improve outcomes and carryover.  Baseline:  Goal status: INITIAL  2.  Patient will report at least 50-75% improvement in R hip pain to improve QOL. Baseline: Up to 5/10  with overexertion Goal status: INITIAL  3.  Patient will demonstrate improved B LE strength to >/= 4+/5 for  improved stability and ease of mobility. Baseline: Refer to above LE MMT table Goal status: INITIAL  4.  Patient will be able to ambulate 600' with LRAD and normal gait pattern without increased pain to access community.  Baseline: ~110 ft with SPC - cues necessary to switch cane to L hand to promote proper reciprocal pattern Goal status: INITIAL  5.  Patient will report >/= 40/80 on LEFS (MCID = 9 pts) to demonstrate improved functional ability. Baseline: 30 / 80 = 37.5 % Goal status: INITIAL  6.  Patient will demonstrate at least 19/24 on DGI to decrease risk of falls. Baseline: TBA Goal status: INITIAL    PLAN:  PT FREQUENCY: 2x/week  PT DURATION: 8 weeks  PLANNED INTERVENTIONS: 97164- PT Re-evaluation, 97750- Physical Performance Testing, 97110-Therapeutic exercises, 97530- Therapeutic activity, 97112- Neuromuscular re-education, 97535- Self Care, 02859- Manual therapy, [phone]- Gait training, [phone]- Aquatic Therapy, [phone]- Electrical stimulation (unattended), 97035- Ultrasound, 79439 (1-2 muscles), 20561 (3+ muscles)- Dry Needling, Patient/Family education, Balance training, Stair training, Taping, Joint mobilization, Scar mobilization, DME instructions, Cryotherapy, and Moist heat  PLAN FOR NEXT SESSION: Review of gait training with SPC or no AD as indicated; gently progress R hip ROM and LE strengthening; balance training; review/update HEP PRN   Sol LITTIE Gaskins, PTA 07/05/2024, 2:05 PM

## 2024-07-06 NOTE — Progress Notes (Unsigned)
 04/21/2019- 47 yoF never smoker for sleep evaluation.referred by [name] Ponto, NP (PCP) for [facility] 03/16/2019; not currently on CPAP HST 04/14/18- AHI 69.1/ hr, desaturation to 74%, body weight 231 lbs HST 03/16/2019- AHI 77.6/ hr, desaturation to 67%, body weight 235 lbs Medical problem list includes HBP, GERD, hyperglycemia, OSA Epworth score 9 Body weight today 242 lbs Told by friends that she snores very loudly, stops breathing, and she is aware of daytime sleepiness if quiet. Denies problems driving. She had a sleep study before she had been seen by her PCP for the issue, and insurance would not cover. She is here now to establish. 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  Denies ENT surgery, lung and heart disease. Not using sleep aid and very little caffeine.  07/07/24- 52 yoF referred back by [name] Ponto, NP with concern of OSA, complicated by HBP, GERD, hyperglycemia, OSA CPAP auto 5-20/ [employer]     ordered new 04/21/19    ROS-see HPI   + = positive Constitutional:    weight loss, night sweats, fevers, chills, fatigue, lassitude. HEENT:    headaches, difficulty swallowing, tooth/dental problems, sore throat,       sneezing, itching, ear ache, nasal congestion, post nasal drip, snoring CV:    chest pain, orthopnea, PND, swelling in lower extremities, anasarca,                                   dizziness, palpitations Resp:   shortness of breath with exertion or at rest.                productive cough,   non-productive cough, coughing up of blood.              change in color of mucus.  wheezing.   Skin:    rash or lesions. GI:  +heartburn, indigestion, abdominal pain, nausea, vomiting, diarrhea,                 change in bowel habits, loss of appetite GU: dysuria, change in color of urine, no urgency or frequency.   flank pain. MS:   joint pain, stiffness, decreased range of motion, back pain. Neuro-     nothing unusual Psych:  change in mood or affect.  depression  or anxiety.   memory loss.  OBJ- Physical Exam General- Alert, Oriented, Affect-appropriate, Distress- none acute, + obese Skin- rash-none, lesions- none, excoriation- none Lymphadenopathy- none Head- atraumatic            Eyes- Gross vision intact, PERRLA, conjunctivae and secretions clear            Ears- Hearing, canals-normal            Nose- Clear, no-Septal dev, mucus, polyps, erosion, perforation             Throat- Mallampati III-IV , mucosa clear , drainage- none, +teeth Neck- flexible , trachea midline, no stridor , thyroid  nl, carotid no bruit Chest - symmetrical excursion , unlabored           Heart/CV- RRR , no murmur , no gallop  , no rub, nl s1 s2                           - JVD- none , edema- none, stasis changes- none, varices- none           Lung- clear  to P&A, wheeze- none, cough- none , dullness-none, rub- none           Chest wall-  Abd-  Br/ Gen/ Rectal- Not done, not indicated Extrem- cyanosis- none, clubbing, none, atrophy- none, strength- nl Neuro- grossly intact to observation

## 2024-07-07 ENCOUNTER — Encounter: Payer: Self-pay | Admitting: Internal Medicine

## 2024-07-07 ENCOUNTER — Ambulatory Visit (INDEPENDENT_AMBULATORY_CARE_PROVIDER_SITE_OTHER): Admitting: Internal Medicine

## 2024-07-07 ENCOUNTER — Ambulatory Visit: Admitting: Physical Therapy

## 2024-07-07 VITALS — BP 136/78 | HR 61 | Temp 98.3°F | Ht 65.0 in | Wt 242.0 lb

## 2024-07-07 DIAGNOSIS — G4733 Obstructive sleep apnea (adult) (pediatric): Secondary | ICD-10-CM

## 2024-07-07 NOTE — Patient Instructions (Signed)
 Order- DME [employer]- please replace old/ malfunctioning CPAP auto 5-20, mask of choice, humidifier, supplies, AirView/ card  Please call if we can help

## 2024-07-08 ENCOUNTER — Encounter: Payer: Self-pay | Admitting: Internal Medicine

## 2024-07-11 DIAGNOSIS — G4733 Obstructive sleep apnea (adult) (pediatric): Secondary | ICD-10-CM | POA: Diagnosis not present

## 2024-07-12 ENCOUNTER — Other Ambulatory Visit (HOSPITAL_BASED_OUTPATIENT_CLINIC_OR_DEPARTMENT_OTHER): Payer: Self-pay

## 2024-07-12 ENCOUNTER — Telehealth: Payer: Self-pay | Admitting: Orthopaedic Surgery

## 2024-07-12 ENCOUNTER — Ambulatory Visit

## 2024-07-12 DIAGNOSIS — M25651 Stiffness of right hip, not elsewhere classified: Secondary | ICD-10-CM | POA: Diagnosis not present

## 2024-07-12 DIAGNOSIS — M6281 Muscle weakness (generalized): Secondary | ICD-10-CM

## 2024-07-12 DIAGNOSIS — R600 Localized edema: Secondary | ICD-10-CM

## 2024-07-12 DIAGNOSIS — M25551 Pain in right hip: Secondary | ICD-10-CM

## 2024-07-12 DIAGNOSIS — R2689 Other abnormalities of gait and mobility: Secondary | ICD-10-CM

## 2024-07-12 NOTE — Therapy (Signed)
 OUTPATIENT PHYSICAL THERAPY TREATMENT   Patient Name: Denise Austin MRN: [id number] DOB:1970/11/02, 53 y.o., female Today's Date: 07/12/2024   END OF SESSION:  PT End of Session - 07/12/24 1318     Visit Number 4    Date for PT Re-Evaluation 08/18/24    Authorization Type [facility] [employer]    PT Start Time 1315    PT Stop Time 1359    PT Time Calculation (min) 44 min    Activity Tolerance Patient tolerated treatment well    Behavior During Therapy [facility] for tasks assessed/performed           Past Medical History:  Diagnosis Date   Allergy    Arthritis    GERD (gastroesophageal reflux disease)    Hyperglycemia    Hypertension    OSA (obstructive sleep apnea) 04/15/2018   PONV (postoperative nausea and vomiting)    Pre-diabetes    Sleep apnea    wears cpap    Uterine fibroid    Past Surgical History:  Procedure Laterality Date   BUNIONECTOMY     Lt-01/07/06, Rt-09/15/07   CHOLECYSTECTOMY     laparoscopic   CYST REMOVAL HAND     Left Wrist   HERNIA REPAIR  1976   umbilical   LUMBAR LAMINECTOMY/DECOMPRESSION MICRODISCECTOMY Right 04/22/2017   Procedure: Extraforaminal Microdiscectomy - Lumbar two-Lumbar three - right;  Surgeon: Joshua Alm RAMAN, MD;  Location: [facility] OR;  Service: Neurosurgery;  Laterality: Right;   lumbar surgery revision  04/24/2017   TOTAL HIP ARTHROPLASTY Right 06/03/2024   Procedure: ARTHROPLASTY, HIP, TOTAL, ANTERIOR APPROACH;  Surgeon: Vernetta Lonni GRADE, MD;  Location: [facility] ORS;  Service: Orthopedics;  Laterality: Right;   WOUND EXPLORATION N/A 05/22/2017   Procedure: Revision Of Lumbar Wound;  Surgeon: Joshua Alm RAMAN, MD;  Location: [facility] OR;  Service: Neurosurgery;  Laterality: N/A;  Lumbar wound revision   Patient Active Problem List   Diagnosis Date Noted   Status post total replacement of right hip 06/03/2024   Migraine without status migrainosus, not intractable 05/06/2023   Symptomatic mammary hypertrophy 12/02/2021   Neck pain 12/02/2021    Morbid obesity due to excess calories (HCC) 04/21/2019   OSA (obstructive sleep apnea) 04/15/2018   Herpes simplex viral infection 07/10/2017   S/P lumbar laminectomy 04/22/2017   GERD (gastroesophageal reflux disease) 07/25/2016   Prediabetes 07/25/2016   HTN (hypertension) 12/24/2015   Preventative health care 10/16/2014   CMC arthritis 07/06/2014   S/P excision of ganglion cyst 07/06/2014   Synovitis 07/06/2014    PCP: Daryl Setter, NP   REFERRING PROVIDER: Vernetta Lonni GRADE, MD  REFERRING DIAG: [phone] (ICD-10-CM) - Status post right hip replacement   We will set her up for outpatient physical therapy at [facility] for transitioning from a walker to a cane to hopefully eventually ambulating without any assist device. They can work on her balance and coordination as well.   THERAPY DIAG:  Stiffness of right hip, not elsewhere classified  Pain in right hip  Muscle weakness (generalized)  Other abnormalities of gait and mobility  Localized edema  RATIONALE FOR EVALUATION AND TREATMENT: Rehabilitation  ONSET DATE: 06/03/2024  NEXT MD VISIT: 07/14/2024   SUBJECTIVE:  SUBJECTIVE STATEMENT: No new reports today  EVAL: Pt reports things started out a little rocky after surgery but now moving better. No longer needs assist with showering but still needs assist with putting on shoes. Has not started any housework yet. Still doing hospital HEP.  She tries to get up every hour and walk. She switched from her RW to her SPC 2 days ago.  PAIN: Are you having pain? Yes: NPRS scale: 0/10   Pain location: R anterior upper hip  Pain description: throbbing, aching pain  Aggravating factors: doing too much with exercises or activity  Relieving factors: sitting or  lying down, Advil  PRN more than prescription meds   PERTINENT HISTORY:  multiple back surgeries - lumbar laminectomy/decompression L2-3 right, OA, GERD, HTN, OSA, prediabetes, morbid obesity, migraine, symptomatic mammary hypertrophy, neck pain  PRECAUTIONS: None  RED FLAGS: None  WEIGHT BEARING RESTRICTIONS: No  FALLS:  Has patient fallen in last 6 months? Yes. 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  Get back to work.   OBJECTIVE: (objective measures completed at initial evaluation unless otherwise dated)  DIAGNOSTIC FINDINGS:  02/06/24 - MR OF THE RIGHT HIP WITHOUT CONTRAST  IMPRESSION: 1. Severe degenerative tearing of the right anterior and superior labrum. Moderate partial-thickness cartilage loss of the right femoral head and acetabulum. 2. No hip fracture, dislocation or avascular necrosis.  PATIENT SURVEYS:  LEFS  Extreme difficulty/unable (0), Quite a bit of difficulty (1), Moderate difficulty (2), Little difficulty (3), No difficulty (4) Survey date:  06/23/24  Any of your usual work, housework or school activities 0  2. Usual hobbies, recreational or sporting activities 0  3. Getting into/out of the bath 4  4. Walking between rooms 4  5. Putting on socks/shoes 2  6. Squatting  0  7. Lifting an object, like a bag of groceries from the floor 0  8. Performing light activities around your home 2  9. Performing heavy activities around your home 0  10. Getting into/out of a car 4  11. Walking 2 blocks 0  12.  Walking 1 mile 0  13. Going up/down 10 stairs (1 flight) 1  14. Standing for 1 hour 1  15.  sitting for 1 hour 4  16. Running on even ground 0  17. Running on uneven ground 0  18. Making sharp turns while running fast 0  19. Hopping  4  20. Rolling over in bed 4  Score total:  30 / 80 = 37.5 %  Functional limitation: Moderate    COGNITION: Overall cognitive status: Within functional limits for tasks assessed    SENSATION: WFL Except some numbness lateral to incision  POSTURE:  weight shift left  MUSCLE LENGTH: Hamstrings: Mild/mod tight B ITB: Mild tight B Piriformis: Mild/mod tight B Hip flexors: NT Quads: NT Heelcord: NT  LOWER EXTREMITY ROM:  A/PROM Right eval Left eval  Hip flexion 61 / 89 94 / 109  Hip extension    Hip abduction    Hip adduction    Hip internal rotation  Hip external rotation      LOWER EXTREMITY MMT:  MMT Right eval Left eval  Hip flexion 3- 4+  Hip extension 4- 4  Hip abduction 4- sitting 4 sitting  Hip adduction 4 sitting 4+ sitting  Hip internal rotation 3 4+  Hip external rotation 3 4+  Knee flexion 4+ 5  Knee extension 5 5  Ankle dorsiflexion 5 5  Ankle plantarflexion    Ankle inversion    Ankle eversion    (Blank rows = not tested)  FUNCTIONAL TESTS:  5 times sit to stand: 16.10 sec w/o UE assist Timed up and go (TUG): 12.88 sec with SPC  GAIT: Distance walked: clinic distances ~110 ft Assistive device utilized: Single point cane Level of assistance: Modified independence and SBA Gait pattern: step through pattern, decreased stance time- Right, and lateral lean- Left Comments: Pt initially using cane in R hand - provided education on reciprocal step pattern using SPC in L hand to offset R foot/LE   TODAY'S TREATMENT:  07/12/24 THERAPEUTIC EXERCISE: To improve strength and endurance.  Demonstration, verbal and tactile cues throughout for technique.  Bike L3x78min Clamshell RTB RLE 10x3; GTB 10x3   NEUROMUSCULAR  RE-EDUCATION: To improve balance, coordination, kinesthesia, posture, and proprioception.  [facility] PT Assessment - 07/12/24 0001       Standardized Balance Assessment   Standardized Balance Assessment Dynamic Gait Index      Dynamic Gait Index   Level Surface Normal    Change in Gait Speed Normal    Gait with Horizontal Head Turns Mild Impairment    Gait with Vertical Head Turns Normal    Gait and Pivot Turn Normal    Step Over Obstacle Normal    Step Around Obstacles Normal    Steps Mild Impairment    Total Score 22          SLS R/L 2 x 30' intermittent UE support  Tandem stance on x30 B on foam Clock reaching on foam 1/2 circle 5x Sidesteps RTB 3x30ft each way  07/05/24 THERAPEUTIC EXERCISE: To improve strength and endurance.  Demonstration, verbal and tactile cues throughout for technique.  Nustep L4x63min Clamshell RTB RLE 2x10 Supine hip ADD w/ ball squeeze 2x10  NEUROMUSCULAR RE-EDUCATION: To improve balance, coordination, kinesthesia, posture, and proprioception. Standing hip abduction looped YTB at ankles; UE support on counter for balance 2x10 Standing hip extension looped YTB at ankles; UE support on counter for balance  2x10 Standing hip flexion march looped YTB at midfeet 2 x 10 Bridges RTB 2x10 Clock reaching R/L 5x 12 to 6 o'clock Modified SLS with head turns x5 SLS R/L x 10'  06/30/2024  THERAPEUTIC EXERCISE: To improve strength and endurance.  Demonstration, verbal and tactile cues throughout for technique.  NuStep - L3 x 6 min (UE/LE) Hooklying TrA + RTB alt unilateral bent-knee fallout 2 x 10 L S/L RTB R clam 2 x 10 Attempted in R S/L but too uncomfortable Bridge + RTB hip ABD isometric 2 x 10 Hooklying TrA + RTB hip flexion march 2 x 10 Hooklying TrA + hip ADD ball squeeze isometric 10 x 5, 2 sets  GAIT TRAINING: To normalize gait pattern and prepare to wean from AD. 90' ft x 2 - 1 lap each with and w/o SPC - even stride length but slight lateral lean  to L when using SPC, although not evident w/o AD  NEUROMUSCULAR RE-EDUCATION: To improve balance, coordination, kinesthesia, posture, and proprioception. Heel-toe raises with counter support x 20  Standing hip abduction 2 x 10, 2nd set with looped YTB at ankles; UE support on counter for balance  Standing hip extension  2 x 10, 2nd set with looped YTB at ankles; UE support on counter for balance  Standing hip flexion SLR x 10 Standing hip flexion march with looped YTB at midfeet x 10   06/21/2024  SELF CARE:  Reviewed eval findings and role of PT in addressing identified deficits as well as instruction in initial HEP (see below) and proper gait pattern with SPC.    PATIENT EDUCATION:  Education details: PT eval findings, anticipated POC, initial HEP, and gait safety with SPC  Person educated: Patient Education method: Explanation, Demonstration, Verbal cues, and [email or web address] app access provided Education comprehension: verbalized understanding, returned demonstration, verbal cues required, and needs further education  HOME EXERCISE PROGRAM: Access Code: [email or web address] URL: [email or web address] Date: 06/30/2024 Prepared by: Elijah Hidden  Exercises - Heel Toe Raises with Counter Support  - 1 x daily - 7 x weekly - 2 sets - 10 reps - 3 sec hold - Standing Hip Abduction with Resistance at Ankles and Unilateral Counter Support  - 1 x daily - 7 x weekly - 2 sets - 10 reps - 3 sec hold - Standing Hip Extension with Resistance at Ankles and Counter Support  - 1 x daily - 7 x weekly - 2 sets - 10 reps - 3 sec hold - Marching with Resistance  - 1 x daily - 7 x weekly - 2 sets - 10 reps - 3 sec hold - Supine Hip Adduction Isometric with Ball  - 2 x daily - 7 x weekly - 2 sets - 10 reps - 5 sec hold - Clam with Resistance  - 1 x daily - 7 x weekly - 2 sets - 10 reps - 3 sec hold - Bridge with Resistance  - 1 x daily - 7 x weekly - 2 sets - 10 reps - 5 sec hold   ASSESSMENT:  CLINICAL  IMPRESSION: Pt able to tolerated the progression of interventions well. She shows single leg balance deficits on L>R and DLB on foam surface. Able to progress w/o any limitations. She met goal for DGI today. She will see Dr. Vernetta this Thursday for post op. She is progressing very well.Denise Austin will benefit from continued skilled PT to address ongoing flexibility, ROM, strength and balance deficits to improve mobility and activity tolerance with decreased pain interference.   EVAL:  SELENI MELLER is a 53 y.o. female who was referred to physical therapy for evaluation and treatment s/p R THA on 06/03/24.  Patient reports initial postop course was a bit rocky but doing better now.  No pain except when she overexerts herself.  Patient has deficits in R hip ROM, B LE flexibility, B LE strength, abnormal posture, and altered gait pattern which are interfering with ADLs and are impacting quality of life.  On LEFS patient scored 30/80 demonstrating moderate functional limitation.  Shalisha will benefit from skilled PT to address above deficits to improve mobility and activity tolerance with decreased pain interference.  OBJECTIVE IMPAIRMENTS: Abnormal gait, decreased activity tolerance, decreased balance, decreased knowledge of condition, decreased knowledge of use of DME, decreased mobility, difficulty walking, decreased ROM, decreased strength, increased fascial restrictions, impaired perceived functional ability, increased muscle spasms, impaired flexibility, improper body mechanics, postural dysfunction, and pain.   ACTIVITY LIMITATIONS: carrying, lifting, bending, standing, squatting, sleeping, stairs, transfers, bed mobility, dressing, and locomotion level  PARTICIPATION LIMITATIONS: meal prep,  cleaning, laundry, driving, shopping, community activity, and occupation  PERSONAL FACTORS: Fitness, Past/current experiences, Time since onset of injury/illness/exacerbation, and 3+ comorbidities:  multiple back surgeries - lumbar laminectomy/decompression L2-3 right, OA, GERD, HTN, OSA, prediabetes, morbid obesity, migraine, symptomatic mammary hypertrophy, neck pain are also affecting patient's functional outcome.   REHAB POTENTIAL: Good  CLINICAL DECISION MAKING: Evolving/moderate complexity  EVALUATION COMPLEXITY: Moderate   GOALS: Goals reviewed with patient? Yes  SHORT TERM GOALS: Target date: 07/21/2024  Patient will be independent with initial HEP. Baseline: Initial HEP provided on eval Goal status: MET - 06/30/24   2.  Patient will report at least 25% improvement in R hip pain to improve QOL. Baseline: Up to 5/10 with overexertion Goal status: MET- 07/05/24  LONG TERM GOALS: Target date: 08/18/2024  Patient will be independent with advanced/ongoing HEP to improve outcomes and carryover.  Baseline:  Goal status: INITIAL  2.  Patient will report at least 50-75% improvement in R hip pain to improve QOL. Baseline: Up to 5/10 with overexertion Goal status: INITIAL  3.  Patient will demonstrate improved B LE strength to >/= 4+/5 for improved stability and ease of mobility. Baseline: Refer to above LE MMT table Goal status: INITIAL  4.  Patient will be able to ambulate 600' with LRAD and normal gait pattern without increased pain to access community.  Baseline: ~110 ft with SPC - cues necessary to switch cane to L hand to promote proper reciprocal pattern Goal status: INITIAL  5.  Patient will report >/= 40/80 on LEFS (MCID = 9 pts) to demonstrate improved functional ability. Baseline: 30 / 80 = 37.5 % Goal status: INITIAL  6.  Patient will demonstrate at least 19/24 on DGI to decrease risk of falls. Baseline: TBA Goal status: MET- 07/12/24    PLAN:  PT FREQUENCY: 2x/week  PT DURATION: 8 weeks  PLANNED INTERVENTIONS: 97164- PT Re-evaluation, 97750- Physical Performance Testing, 97110-Therapeutic exercises, 97530- Therapeutic activity, 97112- Neuromuscular  re-education, 97535- Self Care, [id number]- Manual therapy, [phone]- Gait training, [phone]- Aquatic Therapy, [phone]- Electrical stimulation (unattended), 97035- Ultrasound, 79439 (1-2 muscles), 20561 (3+ muscles)- Dry Needling, Patient/Family education, Balance training, Stair training, Taping, Joint mobilization, Scar mobilization, DME instructions, Cryotherapy, and Moist heat  PLAN FOR NEXT SESSION: gently progress R hip ROM and LE strengthening; balance training;    Sol LITTIE Gaskins, PTA 07/12/2024, 1:59 PM

## 2024-07-12 NOTE — Telephone Encounter (Signed)
 Received request for records 07/14/2024-present. IC patient, received verbal auth to release records. Will process after ov on 07/14/24

## 2024-07-14 ENCOUNTER — Ambulatory Visit (INDEPENDENT_AMBULATORY_CARE_PROVIDER_SITE_OTHER): Admitting: Orthopaedic Surgery

## 2024-07-14 DIAGNOSIS — Z96641 Presence of right artificial hip joint: Secondary | ICD-10-CM

## 2024-07-14 NOTE — Progress Notes (Signed)
 Denise Austin returns today at 6 weeks status post a right total hip arthroplasty through an anterior approach to treat significant right hip pain and arthritis.  She is doing well overall and reports good range of motion and strength.  On exam her right hip moves smoothly and fluidly.  She walks with just a slight limp.  She denies any issues with her incision at all.  She is walking without an assistive device as well.  We spoke about returning to work at some point as she is comfortable and improving.  We did put for return to work starting Monday, August 01, 2024 without restrictions.  However if she wants to return a week earlier and is doing well she can and she will let us  know.  From our standpoint the next time we need to see her clinically is in 6 months with a standing AP pelvis and lateral right hip.  If there are issues before then she knows to reach out and let us  know.

## 2024-07-19 ENCOUNTER — Ambulatory Visit

## 2024-07-19 DIAGNOSIS — R600 Localized edema: Secondary | ICD-10-CM | POA: Diagnosis not present

## 2024-07-19 DIAGNOSIS — M6281 Muscle weakness (generalized): Secondary | ICD-10-CM

## 2024-07-19 DIAGNOSIS — M25551 Pain in right hip: Secondary | ICD-10-CM

## 2024-07-19 DIAGNOSIS — M25651 Stiffness of right hip, not elsewhere classified: Secondary | ICD-10-CM | POA: Diagnosis not present

## 2024-07-19 DIAGNOSIS — R2689 Other abnormalities of gait and mobility: Secondary | ICD-10-CM

## 2024-07-19 NOTE — Therapy (Signed)
 OUTPATIENT PHYSICAL THERAPY TREATMENT   Patient Name: Denise Austin MRN: [id number] DOB:1971/04/09, 53 y.o., female Today's Date: 07/19/2024   END OF SESSION:  PT End of Session - 07/19/24 1020     Visit Number 5    Date for Recertification  08/18/24    Authorization Type [facility] [employer]    PT Start Time 1015    PT Stop Time 1058    PT Time Calculation (min) 43 min    Activity Tolerance Patient tolerated treatment well    Behavior During Therapy WFL for tasks assessed/performed            Past Medical History:  Diagnosis Date   Allergy    Arthritis    GERD (gastroesophageal reflux disease)    Hyperglycemia    Hypertension    OSA (obstructive sleep apnea) 04/15/2018   PONV (postoperative nausea and vomiting)    Pre-diabetes    Sleep apnea    wears cpap    Uterine fibroid    Past Surgical History:  Procedure Laterality Date   BUNIONECTOMY     Lt-01/07/06, Rt-09/15/07   CHOLECYSTECTOMY     laparoscopic   CYST REMOVAL HAND     Left Wrist   HERNIA REPAIR  1976   umbilical   LUMBAR LAMINECTOMY/DECOMPRESSION MICRODISCECTOMY Right 04/22/2017   Procedure: Extraforaminal Microdiscectomy - Lumbar two-Lumbar three - right;  Surgeon: Joshua Alm RAMAN, MD;  Location: [facility] OR;  Service: Neurosurgery;  Laterality: Right;   lumbar surgery revision  04/24/2017   TOTAL HIP ARTHROPLASTY Right 06/03/2024   Procedure: ARTHROPLASTY, HIP, TOTAL, ANTERIOR APPROACH;  Surgeon: Vernetta Lonni GRADE, MD;  Location: [facility] ORS;  Service: Orthopedics;  Laterality: Right;   WOUND EXPLORATION N/A 05/22/2017   Procedure: Revision Of Lumbar Wound;  Surgeon: Joshua Alm RAMAN, MD;  Location: [facility] OR;  Service: Neurosurgery;  Laterality: N/A;  Lumbar wound revision   Patient Active Problem List   Diagnosis Date Noted   Status post total replacement of right hip 06/03/2024   Migraine without status migrainosus, not intractable 05/06/2023   Symptomatic mammary hypertrophy 12/02/2021   Neck pain  12/02/2021   Morbid obesity due to excess calories (HCC) 04/21/2019   OSA (obstructive sleep apnea) 04/15/2018   Herpes simplex viral infection 07/10/2017   S/P lumbar laminectomy 04/22/2017   GERD (gastroesophageal reflux disease) 07/25/2016   Prediabetes 07/25/2016   HTN (hypertension) 12/24/2015   Preventative health care 10/16/2014   CMC arthritis 07/06/2014   S/P excision of ganglion cyst 07/06/2014   Synovitis 07/06/2014    PCP: Daryl Setter, NP   REFERRING PROVIDER: Vernetta Lonni GRADE, MD  REFERRING DIAG: [id number] (ICD-10-CM) - Status post right hip replacement   We will set her up for outpatient physical therapy at [facility] for transitioning from a walker to a cane to hopefully eventually ambulating without any assist device. They can work on her balance and coordination as well.   THERAPY DIAG:  Stiffness of right hip, not elsewhere classified  Pain in right hip  Muscle weakness (generalized)  Other abnormalities of gait and mobility  Localized edema  RATIONALE FOR EVALUATION AND TREATMENT: Rehabilitation  ONSET DATE: 06/03/2024  NEXT MD VISIT: 6 months (11/16/23)   SUBJECTIVE:  SUBJECTIVE STATEMENT: No new reports today, Dr. Vernetta left it up to her about continuing PT, stated that he wrote for her to return to work on October 6th but based on how she feels she can start earlier  EVAL: Pt reports things started out a little rocky after surgery but now moving better. No longer needs assist with showering but still needs assist with putting on shoes. Has not started any housework yet. Still doing hospital HEP.  She tries to get up every hour and walk. She switched from her RW to her SPC 2 days ago.  PAIN: Are you having pain? Yes: NPRS scale:  0/10   Pain location: R anterior upper hip  Pain description: throbbing, aching pain  Aggravating factors: doing too much with exercises or activity  Relieving factors: sitting or lying down, Advil  PRN more than prescription meds   PERTINENT HISTORY:  multiple back surgeries - lumbar laminectomy/decompression L2-3 right, OA, GERD, HTN, OSA, prediabetes, morbid obesity, migraine, symptomatic mammary hypertrophy, neck pain  PRECAUTIONS: None  RED FLAGS: None  WEIGHT BEARING RESTRICTIONS: No  FALLS:  Has patient fallen in last 6 months? Yes. 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  Get back to work.   OBJECTIVE: (objective measures completed at initial evaluation unless otherwise dated)  DIAGNOSTIC FINDINGS:  02/06/24 - MR OF THE RIGHT HIP WITHOUT CONTRAST  IMPRESSION: 1. Severe degenerative tearing of the right anterior and superior labrum. Moderate partial-thickness cartilage loss of the right femoral head and acetabulum. 2. No hip fracture, dislocation or avascular necrosis.  PATIENT SURVEYS:  LEFS  Extreme difficulty/unable (0), Quite a bit of difficulty (1), Moderate difficulty (2), Little difficulty (3), No difficulty (4) Survey date:  06/23/24  Any of your usual work, housework or school activities 0  2. Usual hobbies, recreational or sporting activities 0  3. Getting into/out of the bath 4  4. Walking between rooms 4  5. Putting on socks/shoes 2  6. Squatting  0  7. Lifting an object, like a bag of groceries  from the floor 0  8. Performing light activities around your home 2  9. Performing heavy activities around your home 0  10. Getting into/out of a car 4  11. Walking 2 blocks 0  12. Walking 1 mile 0  13. Going up/down 10 stairs (1 flight) 1  14. Standing for 1 hour 1  15.  sitting for 1 hour 4  16. Running on even ground 0  17. Running on uneven ground 0  18. Making sharp turns while running fast 0  19. Hopping  4  20. Rolling over in bed 4  Score total:  30 / 80 = 37.5 %  Functional limitation: Moderate    COGNITION: Overall cognitive status: Within functional limits for tasks assessed    SENSATION: WFL Except some numbness lateral to incision  POSTURE:  weight shift left  MUSCLE LENGTH: Hamstrings: Mild/mod tight B ITB: Mild tight B Piriformis: Mild/mod tight B Hip flexors: NT Quads: NT Heelcord: NT  LOWER EXTREMITY ROM:  A/PROM Right  eval Left eval  Hip flexion 61 / 89 94 / 109  Hip extension    Hip abduction    Hip adduction    Hip internal rotation    Hip external rotation      LOWER EXTREMITY MMT:  MMT Right eval Left eval R 07/19/24 L 07/19/24  Hip flexion 3- 4+ 4+ 5  Hip extension 4- 4 4 4   Hip abduction 4- sitting 4 sitting 4+ 4  Hip adduction 4 sitting 4+ sitting    Hip internal rotation 3 4+ 4+ 4+  Hip external rotation 3 4+ 4 4+  Knee flexion 4+ 5    Knee extension 5 5    Ankle dorsiflexion 5 5    Ankle plantarflexion      Ankle inversion      Ankle eversion      (Blank rows = not tested)  FUNCTIONAL TESTS:  5 times sit to stand: 16.10 sec w/o UE assist Timed up and go (TUG): 12.88 sec with SPC  GAIT: Distance walked: clinic distances ~110 ft Assistive device utilized: Single point cane Level of assistance: Modified independence and SBA Gait pattern: step through pattern, decreased stance time- Right, and lateral lean- Left Comments: Pt initially using cane in R hand - provided education on reciprocal step pattern using SPC in L hand to  offset R foot/LE   TODAY'S TREATMENT:  07/19/24 NEUROMUSCULAR RE-EDUCATION: To improve coordination, kinesthesia, posture, and proprioception.  Tandem stance R/L alternate in front  R/L SLS  Clock balance standing on balance disk LLE Monsterwalks and sidesteps GTB at ankles   THERAPEUTIC EXERCISE: To improve strength, endurance, ROM, and flexibility.  Bike L4x41min   07/12/24 THERAPEUTIC EXERCISE: To improve strength and endurance.  Demonstration, verbal and tactile cues throughout for technique.  Bike L3x34min Clamshell RTB RLE 10x3; GTB 10x3   NEUROMUSCULAR RE-EDUCATION: To improve balance, coordination, kinesthesia, posture, and proprioception.  [facility] PT Assessment - 07/12/24 0001       Standardized Balance Assessment   Standardized Balance Assessment Dynamic Gait Index      Dynamic Gait Index   Level Surface Normal    Change in Gait Speed Normal    Gait with Horizontal Head Turns Mild Impairment    Gait with Vertical Head Turns Normal    Gait and Pivot Turn Normal    Step Over Obstacle Normal    Step Around Obstacles Normal    Steps Mild Impairment    Total Score 22          SLS R/L 2 x 30' intermittent UE support  Tandem stance on x30 B on foam Clock reaching on foam 1/2 circle 5x Sidesteps RTB 3x27ft each way  07/05/24 THERAPEUTIC EXERCISE: To improve strength and endurance.  Demonstration, verbal and tactile cues throughout for technique.  Nustep L4x98min Clamshell RTB RLE 2x10 Supine hip ADD w/ ball squeeze 2x10  NEUROMUSCULAR RE-EDUCATION: To improve balance, coordination, kinesthesia, posture, and proprioception. Standing hip abduction looped YTB at ankles; UE support on counter for balance 2x10 Standing hip extension looped YTB at ankles; UE support on counter for balance  2x10 Standing hip flexion march looped YTB at midfeet 2 x 10 Bridges RTB 2x10 Clock reaching R/L 5x 12 to 6 o'clock Modified SLS with head turns x5 SLS R/L x 10'  06/30/2024   THERAPEUTIC EXERCISE: To improve strength and endurance.  Demonstration, verbal and tactile cues throughout for technique.  NuStep - L3 x 6 min (UE/LE) Hooklying TrA + RTB alt unilateral bent-knee  fallout 2 x 10 L S/L RTB R clam 2 x 10 Attempted in R S/L but too uncomfortable Bridge + RTB hip ABD isometric 2 x 10 Hooklying TrA + RTB hip flexion march 2 x 10 Hooklying TrA + hip ADD ball squeeze isometric 10 x 5, 2 sets  GAIT TRAINING: To normalize gait pattern and prepare to wean from AD. 90' ft x 2 - 1 lap each with and w/o SPC - even stride length but slight lateral lean to L when using SPC, although not evident w/o AD  NEUROMUSCULAR RE-EDUCATION: To improve balance, coordination, kinesthesia, posture, and proprioception. Heel-toe raises with counter support x 20 Standing hip abduction 2 x 10, 2nd set with looped YTB at ankles; UE support on counter for balance  Standing hip extension  2 x 10, 2nd set with looped YTB at ankles; UE support on counter for balance  Standing hip flexion SLR x 10 Standing hip flexion march with looped YTB at midfeet x 10   06/21/2024  SELF CARE:  Reviewed eval findings and role of PT in addressing identified deficits as well as instruction in initial HEP (see below) and proper gait pattern with SPC.    PATIENT EDUCATION:  Education details: PT eval findings, anticipated POC, initial HEP, and gait safety with SPC  Person educated: Patient Education method: Explanation, Demonstration, Verbal cues, and MedBridgeGO app access provided Education comprehension: verbalized understanding, returned demonstration, verbal cues required, and needs further education  HOME EXERCISE PROGRAM: Access Code: [email or web address] URL: [email or web address] Date: 06/30/2024 Prepared by: Elijah Hidden  Exercises - Heel Toe Raises with Counter Support  - 1 x daily - 7 x weekly - 2 sets - 10 reps - 3 sec hold - Standing Hip Abduction with Resistance at Ankles and  Unilateral Counter Support  - 1 x daily - 7 x weekly - 2 sets - 10 reps - 3 sec hold - Standing Hip Extension with Resistance at Ankles and Counter Support  - 1 x daily - 7 x weekly - 2 sets - 10 reps - 3 sec hold - Marching with Resistance  - 1 x daily - 7 x weekly - 2 sets - 10 reps - 3 sec hold - Supine Hip Adduction Isometric with Ball  - 2 x daily - 7 x weekly - 2 sets - 10 reps - 5 sec hold - Clam with Resistance  - 1 x daily - 7 x weekly - 2 sets - 10 reps - 3 sec hold - Bridge with Resistance  - 1 x daily - 7 x weekly - 2 sets - 10 reps - 5 sec hold   ASSESSMENT:  CLINICAL IMPRESSION: Pt has recently seen Dr. Vernetta and was given the option to conclude PT. Based on what we've seen today, she shows improved LE strength with some continued weakness in her gluteal muscles. She shows a gait pattern WFL. She has met goal for LEFS. We did review th HEP one final time and update accordingly. She agrees to 30 day hold at this time.   EVAL:  Denise Austin is a 53 y.o. female who was referred to physical therapy for evaluation and treatment s/p R THA on 06/03/24.  Patient reports initial postop course was a bit rocky but doing better now.  No pain except when she overexerts herself.  Patient has deficits in R hip ROM, B LE flexibility, B LE strength, abnormal posture, and altered gait pattern which are interfering with ADLs and are impacting quality  of life.  On LEFS patient scored 30/80 demonstrating moderate functional limitation.  Shawntelle will benefit from skilled PT to address above deficits to improve mobility and activity tolerance with decreased pain interference.  OBJECTIVE IMPAIRMENTS: Abnormal gait, decreased activity tolerance, decreased balance, decreased knowledge of condition, decreased knowledge of use of DME, decreased mobility, difficulty walking, decreased ROM, decreased strength, increased fascial restrictions, impaired perceived functional ability, increased muscle spasms,  impaired flexibility, improper body mechanics, postural dysfunction, and pain.   ACTIVITY LIMITATIONS: carrying, lifting, bending, standing, squatting, sleeping, stairs, transfers, bed mobility, dressing, and locomotion level  PARTICIPATION LIMITATIONS: meal prep, cleaning, laundry, driving, shopping, community activity, and occupation  PERSONAL FACTORS: Fitness, Past/current experiences, Time since onset of injury/illness/exacerbation, and 3+ comorbidities: multiple back surgeries - lumbar laminectomy/decompression L2-3 right, OA, GERD, HTN, OSA, prediabetes, morbid obesity, migraine, symptomatic mammary hypertrophy, neck pain are also affecting patient's functional outcome.   REHAB POTENTIAL: Good  CLINICAL DECISION MAKING: Evolving/moderate complexity  EVALUATION COMPLEXITY: Moderate   GOALS: Goals reviewed with patient? Yes  SHORT TERM GOALS: Target date: 07/21/2024  Patient will be independent with initial HEP. Baseline: Initial HEP provided on eval Goal status: MET - 06/30/24   2.  Patient will report at least 25% improvement in R hip pain to improve QOL. Baseline: Up to 5/10 with overexertion Goal status: MET- 07/05/24  LONG TERM GOALS: Target date: 08/18/2024  Patient will be independent with advanced/ongoing HEP to improve outcomes and carryover.  Baseline:  Goal status: MET- 07/19/24  2.  Patient will report at least 50-75% improvement in R hip pain to improve QOL. Baseline: Up to 5/10 with overexertion Goal status: MET- 07/19/24  3.  Patient will demonstrate improved B LE strength to >/= 4+/5 for improved stability and ease of mobility. Baseline: Refer to above LE MMT table Goal status: NOT MET- 07/19/24  4.  Patient will be able to ambulate 600' with LRAD and normal gait pattern without increased pain to access community.  Baseline: ~110 ft with [facility] - cues necessary to switch cane to L hand to promote proper reciprocal pattern Goal status: MET- 07/19/24  5.  Patient  will report >/= 40/80 on LEFS (MCID = 9 pts) to demonstrate improved functional ability. Baseline: 30 / 80 = 37.5 % Goal status: MET- 76 / 80 = 95.0 %  6.  Patient will demonstrate at least 19/24 on DGI to decrease risk of falls. Baseline: TBA Goal status: MET- 07/12/24    PLAN:  PT FREQUENCY: 2x/week  PT DURATION: 8 weeks  PLANNED INTERVENTIONS: 97164- PT Re-evaluation, 97750- Physical Performance Testing, 97110-Therapeutic exercises, 97530- Therapeutic activity, 97112- Neuromuscular re-education, 97535- Self Care, 02859- Manual therapy, [phone]- Gait training, [phone]- Aquatic Therapy, [phone]- Electrical stimulation (unattended), 97035- Ultrasound, 79439 (1-2 muscles), 20561 (3+ muscles)- Dry Needling, Patient/Family education, Balance training, Stair training, Taping, Joint mobilization, Scar mobilization, DME instructions, Cryotherapy, and Moist heat  PLAN FOR NEXT SESSION: 30 day hold   Veleda Mun L Kryssa Risenhoover, PTA 07/19/2024, 10:58 AM

## 2024-07-21 ENCOUNTER — Encounter: Admitting: Physical Therapy

## 2024-07-21 DIAGNOSIS — G4733 Obstructive sleep apnea (adult) (pediatric): Secondary | ICD-10-CM | POA: Diagnosis not present

## 2024-07-21 DIAGNOSIS — R269 Unspecified abnormalities of gait and mobility: Secondary | ICD-10-CM | POA: Diagnosis not present

## 2024-07-26 ENCOUNTER — Encounter

## 2024-07-26 ENCOUNTER — Other Ambulatory Visit (HOSPITAL_COMMUNITY): Payer: Self-pay

## 2024-07-26 MED ORDER — AMOXICILLIN 500 MG PO CAPS
2000.0000 mg | ORAL_CAPSULE | Freq: Once | ORAL | 0 refills | Status: AC
Start: 2024-07-07 — End: 2024-07-27
  Filled 2024-07-26: qty 4, 1d supply, fill #0

## 2024-07-28 ENCOUNTER — Encounter: Admitting: Physical Therapy

## 2024-08-17 ENCOUNTER — Other Ambulatory Visit: Payer: Self-pay

## 2024-08-17 ENCOUNTER — Other Ambulatory Visit (HOSPITAL_COMMUNITY): Payer: Self-pay

## 2024-08-17 ENCOUNTER — Other Ambulatory Visit: Payer: Self-pay | Admitting: Family

## 2024-08-17 MED ORDER — LOSARTAN POTASSIUM 50 MG PO TABS
50.0000 mg | ORAL_TABLET | Freq: Every day | ORAL | 1 refills | Status: AC
  Filled 2024-08-17: qty 90, 90d supply, fill #0
  Filled 2024-11-17: qty 90, 90d supply, fill #1

## 2024-08-20 DIAGNOSIS — G4733 Obstructive sleep apnea (adult) (pediatric): Secondary | ICD-10-CM | POA: Diagnosis not present

## 2024-08-29 ENCOUNTER — Encounter: Payer: Self-pay | Admitting: Radiology

## 2024-09-19 ENCOUNTER — Ambulatory Visit: Admitting: Orthopedic Surgery

## 2024-09-19 DIAGNOSIS — R200 Anesthesia of skin: Secondary | ICD-10-CM | POA: Diagnosis not present

## 2024-09-19 DIAGNOSIS — M25532 Pain in left wrist: Secondary | ICD-10-CM

## 2024-09-19 DIAGNOSIS — R202 Paresthesia of skin: Secondary | ICD-10-CM

## 2024-09-19 NOTE — Progress Notes (Signed)
 Denise Austin - 53 y.o. female MRN [id number]  Date of birth: Apr 01, 1971  Office Visit Note: Visit Date: 09/19/2024 PCP: Daryl Setter, NP Referred by: Daryl Setter, NP  Subjective: No chief complaint on file.  HPI: Denise Austin is a pleasant 53 y.o. female who presents today for evaluation of left hand numbness and tingling in the radial sided digits that has been progressive now for multiple weeks.  Symptoms are becoming more prevalent throughout the course of the day as well as ongoing nocturnal symptoms.  She has not undergone any significant workup or testing prior.  She is right-hand dominant, overall healthy and active at baseline.  Pertinent ROS were reviewed with the patient and found to be negative unless otherwise specified above in HPI.   Visit Reason: left wrist Duration of symptoms:2+ weeks Hand dominance: right Occupation: [employer] Diabetic: No Smoking: No Heart/Lung History: none Blood Thinners:  none  Prior Testing/EMG: none Injections (Date):none Treatments:none Prior Surgery: none    Assessment & Plan: Visit Diagnoses:  1. Pain in left wrist   2. Numbness and tingling in left hand     Plan: Based on clinical examination today, there is concern for progressive left-sided carpal tunnel syndrome.  She will be fitted for a left wrist brace to be utilized for nocturnal symptoms as well as throughout the course of the day as needed.  I will also like for her to obtain an electrodiagnostic study of the left upper extremity in order to better delineate site and severity of potential nerve compression pathology in order to help guide treatment moving forward.  Patient expressed full understanding, will return after electrodiagnostic study is complete to discuss appropriate next treatment steps.  Follow-up: No follow-ups on file.   Meds & Orders: No orders of the defined types were placed in this encounter.   Orders Placed This Encounter   Procedures   Ambulatory referral to Physical Medicine Rehab     Procedures: No procedures performed      Clinical History: No specialty comments available.  She reports that she has never smoked. She has never used smokeless tobacco.  Recent Labs    05/06/24 1159  HGBA1C 6.1    Objective:   Vital Signs: LMP 04/02/2018   Physical Exam  Gen: Well-appearing, in no acute distress; non-toxic CV: Regular Rate. Well-perfused. Warm.  Resp: Breathing unlabored on room air; no wheezing. Psych: Fluid speech in conversation; appropriate affect; normal thought process  Ortho Exam PHYSICAL EXAM:  General: Patient is well appearing and in no distress.   Skin and Muscle: No skin changes are apparent to upper extremities.   Range of Motion and Palpation Tests: Mobility is full about the elbows with flexion and extension. Forearm supination and pronation are 85/85 bilaterally.  Wrist flexion/extension is 75/65 bilaterally.  Digital flexion and extension are full.  Thumb opposition is full to the base of the small fingers bilaterally.    No cords or nodules are palpated.  No triggering is observed.    Neurologic, Vascular, Motor: Sensation is diminished to light touch in the left median nerve distribution.    Thenar atrophy: Negative left Tinel sign: Positive left carpal tunnel Carpal tunnel compression: Positive left Phalen test: Positive left  Motor left hand FPL: 5/5 Index FDP: 5/5 APB: 5/5 Thumb opposition to small finger DPC  Fingers pink and well perfused.  Capillary refill is brisk.     Lab Results  Component Value Date   HGBA1C 6.1 05/06/2024  Imaging: No results found.  Past Medical/Family/Surgical/Social History: Medications & Allergies reviewed per EMR, new medications updated. Patient Active Problem List   Diagnosis Date Noted   Status post total replacement of right hip 06/03/2024   Migraine without status migrainosus, not intractable 05/06/2023    Symptomatic mammary hypertrophy 12/02/2021   Neck pain 12/02/2021   Morbid obesity due to excess calories (HCC) 04/21/2019   OSA (obstructive sleep apnea) 04/15/2018   Herpes simplex viral infection 07/10/2017   S/P lumbar laminectomy 04/22/2017   GERD (gastroesophageal reflux disease) 07/25/2016   Prediabetes 07/25/2016   HTN (hypertension) 12/24/2015   Preventative health care 10/16/2014   CMC arthritis 07/06/2014   S/P excision of ganglion cyst 07/06/2014   Synovitis 07/06/2014   Past Medical History:  Diagnosis Date   Allergy    Arthritis    GERD (gastroesophageal reflux disease)    Hyperglycemia    Hypertension    OSA (obstructive sleep apnea) 04/15/2018   PONV (postoperative nausea and vomiting)    Pre-diabetes    Sleep apnea    wears cpap    Uterine fibroid    Family History  Problem Relation Age of Onset   Hypertension Mother    Diabetes Mother    Sleep apnea Mother    Obesity Mother    Stroke Father    Hypertension Father        smoker   COPD Father    Single kidney Sister        removed as a child   Heart attack Neg Hx    Hyperlipidemia Neg Hx    Sudden death Neg Hx    Colon cancer Neg Hx    Colon polyps Neg Hx    Esophageal cancer Neg Hx    Stomach cancer Neg Hx    Rectal cancer Neg Hx    Past Surgical History:  Procedure Laterality Date   BUNIONECTOMY     Lt-01/07/06, Rt-09/15/07   CHOLECYSTECTOMY     laparoscopic   CYST REMOVAL HAND     Left Wrist   HERNIA REPAIR  1976   umbilical   LUMBAR LAMINECTOMY/DECOMPRESSION MICRODISCECTOMY Right 04/22/2017   Procedure: Extraforaminal Microdiscectomy - Lumbar two-Lumbar three - right;  Surgeon: Joshua Alm RAMAN, MD;  Location: [facility] OR;  Service: Neurosurgery;  Laterality: Right;   lumbar surgery revision  04/24/2017   TOTAL HIP ARTHROPLASTY Right 06/03/2024   Procedure: ARTHROPLASTY, HIP, TOTAL, ANTERIOR APPROACH;  Surgeon: Vernetta Lonni GRADE, MD;  Location: [facility] ORS;  Service: Orthopedics;   Laterality: Right;   WOUND EXPLORATION N/A 05/22/2017   Procedure: Revision Of Lumbar Wound;  Surgeon: Joshua Alm RAMAN, MD;  Location: [facility] OR;  Service: Neurosurgery;  Laterality: N/A;  Lumbar wound revision   Social History   Occupational History   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 [facility], Hand Surgery

## 2024-09-20 ENCOUNTER — Ambulatory Visit: Admitting: Physical Medicine and Rehabilitation

## 2024-09-20 DIAGNOSIS — G4733 Obstructive sleep apnea (adult) (pediatric): Secondary | ICD-10-CM | POA: Diagnosis not present

## 2024-09-20 DIAGNOSIS — R202 Paresthesia of skin: Secondary | ICD-10-CM | POA: Diagnosis not present

## 2024-09-20 DIAGNOSIS — R269 Unspecified abnormalities of gait and mobility: Secondary | ICD-10-CM | POA: Diagnosis not present

## 2024-09-20 NOTE — Progress Notes (Signed)
 Denise Austin - 53 y.o. female MRN [id number]  Date of birth: 1971/02/27  Office Visit Note: Visit Date: 09/20/2024 PCP: Daryl Setter, NP Referred by: Arlinda Buster, MD  Subjective: Chief Complaint  Patient presents with   Left Hand - Numbness, Weakness   HPI: Denise Austin is a 53 y.o. female who comes in today at the request of Dr. Anshul Agarwala for evaluation and management of chronic, worsening and severe pain, numbness and tingling in the Left upper extremities.  Patient is Right hand dominant.  She reports several weeks of worsening numbness and tingling in the left hand.  This is more radial digits.  It started at night with positive flick sign.  It is now began to bother her during the day despite ongoing conservative treatment.  She denies any right sided complaints.  No Austin radicular symptoms.  No prior electrodiagnostic study.         ROS Otherwise per HPI.  Assessment & Plan: Visit Diagnoses:    ICD-10-CM   1. Paresthesia of skin  R20.2 NCV with EMG (electromyography)       Plan: Impression: The above electrodiagnostic study is ABNORMAL and reveals evidence of a mild right median nerve entrapment at the wrist (carpal tunnel syndrome) affecting sensory components.   There is no significant electrodiagnostic evidence of any other focal nerve entrapment, brachial plexopathy or cervical radiculopathy.   Recommendations: 1.  Follow-up with referring physician. 2.  Continue use of resting splint at night-time and as needed during the day. 3.  Continue current management of symptoms.  Meds & Orders: No orders of the defined types were placed in this encounter.   Orders Placed This Encounter  Procedures   NCV with EMG (electromyography)    Follow-up: Return for Anshul Agarwala, MD.   Procedures: No procedures performed  EMG & NCV Findings: Evaluation of the left median (across palm) sensory nerve showed prolonged distal peak latency  (Wrist, 3.8 ms).  The left ulnar sensory nerve showed reduced amplitude (7.3 V).  All remaining nerves (as indicated in the following tables) were within normal limits.    All examined muscles (as indicated in the following table) showed no evidence of electrical instability.    Impression: The above electrodiagnostic study is ABNORMAL and reveals evidence of a mild right median nerve entrapment at the wrist (carpal tunnel syndrome) affecting sensory components.   There is no significant electrodiagnostic evidence of any other focal nerve entrapment, brachial plexopathy or cervical radiculopathy.   Recommendations: 1.  Follow-up with referring physician. 2.  Continue use of resting splint at night-time and as needed during the day. 3.  Continue current management of symptoms.  ___________________________ Prentice Masters FAAPMR Board Certified, American Board of Physical Medicine and Rehabilitation    Nerve Conduction Studies Anti Sensory Summary Table   Stim Site NR Peak (ms) Norm Peak (ms) P-T Amp (V) Norm P-T Amp Site1 Site2 Delta-P (ms) Dist (cm) Vel (m/s) Norm Vel (m/s)  Left Median Acr Palm Anti Sensory (2nd Digit)  30.3C  Wrist    *3.8 <3.6 14.9 >10 Wrist Palm 2.3 0.0    Palm    1.5 <2.0 17.5         Left Radial Anti Sensory (Base 1st Digit)  31.5C  Wrist    1.9 <3.1 17.7  Wrist Base 1st Digit 1.9 0.0    Left Ulnar Anti Sensory (5th Digit)  31.6C  Wrist    3.1 <3.7 *7.3 >15.0 Wrist 5th Digit 3.1  14.0 45 >38   Motor Summary Table   Stim Site NR Onset (ms) Norm Onset (ms) O-P Amp (mV) Norm O-P Amp Site1 Site2 Delta-0 (ms) Dist (cm) Vel (m/s) Norm Vel (m/s)  Left Median Motor (Abd Poll Brev)  31.6C  Wrist    3.9 <4.2 7.3 >5 Elbow Wrist 3.8 21.0 55 >50  Elbow    7.7  7.5         Left Ulnar Motor (Abd Dig Min)  31.9C  Wrist    2.9 <4.2 6.5 >3 B Elbow Wrist 3.0 18.5 62 >53  B Elbow    5.9  5.9  A Elbow B Elbow 1.6 10.0 63 >53  A Elbow    7.5  5.9          EMG   Side  Muscle Nerve Root Ins Act Fibs Psw Amp Dur Poly Recrt Int Bruna Comment  Left Abd Poll Brev Median C8-T1 Nml Nml Nml Nml Nml 0 Nml Nml   Left 1stDorInt Ulnar C8-T1 Nml Nml Nml Nml Nml 0 Nml Nml   Left PronatorTeres Median C6-7 Nml Nml Nml Nml Nml 0 Nml Nml   Left Biceps Musculocut C5-6 Nml Nml Nml Nml Nml 0 Nml Nml     Nerve Conduction Studies Anti Sensory Left/Right Comparison   Stim Site L Lat (ms) R Lat (ms) L-R Lat (ms) L Amp (V) R Amp (V) L-R Amp (%) Site1 Site2 L Vel (m/s) R Vel (m/s) L-R Vel (m/s)  Median Acr Palm Anti Sensory (2nd Digit)  30.3C  Wrist *3.8   14.9   Wrist Palm     Palm 1.5   17.5         Radial Anti Sensory (Base 1st Digit)  31.5C  Wrist 1.9   17.7   Wrist Base 1st Digit     Ulnar Anti Sensory (5th Digit)  31.6C  Wrist 3.1   *7.3   Wrist 5th Digit 45     Motor Left/Right Comparison   Stim Site L Lat (ms) R Lat (ms) L-R Lat (ms) L Amp (mV) R Amp (mV) L-R Amp (%) Site1 Site2 L Vel (m/s) R Vel (m/s) L-R Vel (m/s)  Median Motor (Abd Poll Brev)  31.6C  Wrist 3.9   7.3   Elbow Wrist 55    Elbow 7.7   7.5         Ulnar Motor (Abd Dig Min)  31.9C  Wrist 2.9   6.5   B Elbow Wrist 62    B Elbow 5.9   5.9   A Elbow B Elbow 63    A Elbow 7.5   5.9            Waveforms:            Clinical History: No specialty comments available.   She reports that she has never smoked. She has never used smokeless tobacco.  Recent Labs    05/06/24 1159  HGBA1C 6.1    Objective:  VS:  HT:    WT:   BMI:     BP:   HR: bpm  TEMP: ( )  RESP:  Physical Exam Musculoskeletal:        General: Tenderness present. No swelling or deformity.     Comments: Inspection reveals no atrophy of the bilateral APB or FDI or hand intrinsics. There is no swelling, color changes, allodynia or dystrophic changes. There is 5 out of 5 strength in the bilateral wrist extension, finger abduction and  long finger flexion. There is intact sensation to light touch in all dermatomal and  peripheral nerve distributions. There is a positive Phalen's test on left. There is a negative Hoffmann's test bilaterally.  Skin:    General: Skin is warm and dry.     Findings: No erythema or rash.  Neurological:     General: No focal deficit present.     Mental Status: She is alert and oriented to person, place, and time.     Cranial Nerves: No cranial nerve deficit.     Sensory: No sensory deficit.     Motor: No weakness or abnormal muscle tone.     Coordination: Coordination normal.     Gait: Gait normal.  Psychiatric:        Mood and Affect: Mood normal.        Behavior: Behavior normal.     Ortho Exam  Imaging: No results found.  Past Medical/Family/Surgical/Social History: Medications & Allergies reviewed per EMR, new medications updated. Patient Active Problem List   Diagnosis Date Noted   Status post total replacement of right hip 06/03/2024   Migraine without status migrainosus, not intractable 05/06/2023   Symptomatic mammary hypertrophy 12/02/2021   Neck pain 12/02/2021   Morbid obesity due to excess calories (HCC) 04/21/2019   OSA (obstructive sleep apnea) 04/15/2018   Herpes simplex viral infection 07/10/2017   S/P lumbar laminectomy 04/22/2017   GERD (gastroesophageal reflux disease) 07/25/2016   Prediabetes 07/25/2016   HTN (hypertension) 12/24/2015   Preventative health care 10/16/2014   CMC arthritis 07/06/2014   S/P excision of ganglion cyst 07/06/2014   Synovitis 07/06/2014   Past Medical History:  Diagnosis Date   Allergy    Arthritis    GERD (gastroesophageal reflux disease)    Hyperglycemia    Hypertension    OSA (obstructive sleep apnea) 04/15/2018   PONV (postoperative nausea and vomiting)    Pre-diabetes    Sleep apnea    wears cpap    Uterine fibroid    Family History  Problem Relation Age of Onset   Hypertension Mother    Diabetes Mother    Sleep apnea Mother    Obesity Mother    Stroke Father    Hypertension Father         smoker   COPD Father    Single kidney Sister        removed as a child   Heart attack Neg Hx    Hyperlipidemia Neg Hx    Sudden death Neg Hx    Colon cancer Neg Hx    Colon polyps Neg Hx    Esophageal cancer Neg Hx    Stomach cancer Neg Hx    Rectal cancer Neg Hx    Past Surgical History:  Procedure Laterality Date   BUNIONECTOMY     Lt-01/07/06, Rt-09/15/07   CHOLECYSTECTOMY     laparoscopic   CYST REMOVAL HAND     Left Wrist   HERNIA REPAIR  1976   umbilical   LUMBAR LAMINECTOMY/DECOMPRESSION MICRODISCECTOMY Right 04/22/2017   Procedure: Extraforaminal Microdiscectomy - Lumbar two-Lumbar three - right;  Surgeon: Joshua Alm RAMAN, MD;  Location: [facility] OR;  Service: Neurosurgery;  Laterality: Right;   lumbar surgery revision  04/24/2017   TOTAL HIP ARTHROPLASTY Right 06/03/2024   Procedure: ARTHROPLASTY, HIP, TOTAL, ANTERIOR APPROACH;  Surgeon: Vernetta Lonni GRADE, MD;  Location: [facility] ORS;  Service: Orthopedics;  Laterality: Right;   WOUND EXPLORATION N/A 05/22/2017   Procedure: Revision Of Lumbar Wound;  Surgeon: Joshua Alm RAMAN, MD;  Location: [facility] OR;  Service: Neurosurgery;  Laterality: N/A;  Lumbar wound revision   Social History   Occupational History   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

## 2024-09-20 NOTE — Progress Notes (Signed)
 Pain Scale   Average Pain 0 Patient advising she has left hand numbness, tingling and some weakness. Patient is right hand dominate        +Driver, -BT, -Dye Allergies.

## 2024-09-21 NOTE — Procedures (Signed)
 EMG & NCV Findings: Evaluation of the left median (across palm) sensory nerve showed prolonged distal peak latency (Wrist, 3.8 ms).  The left ulnar sensory nerve showed reduced amplitude (7.3 V).  All remaining nerves (as indicated in the following tables) were within normal limits.    All examined muscles (as indicated in the following table) showed no evidence of electrical instability.    Impression: The above electrodiagnostic study is ABNORMAL and reveals evidence of a mild right median nerve entrapment at the wrist (carpal tunnel syndrome) affecting sensory components.   There is no significant electrodiagnostic evidence of any other focal nerve entrapment, brachial plexopathy or cervical radiculopathy.   Recommendations: 1.  Follow-up with referring physician. 2.  Continue use of resting splint at night-time and as needed during the day. 3.  Continue current management of symptoms.  ___________________________ Prentice Masters FAAPMR Board Certified, American Board of Physical Medicine and Rehabilitation    Nerve Conduction Studies Anti Sensory Summary Table   Stim Site NR Peak (ms) Norm Peak (ms) P-T Amp (V) Norm P-T Amp Site1 Site2 Delta-P (ms) Dist (cm) Vel (m/s) Norm Vel (m/s)  Left Median Acr Palm Anti Sensory (2nd Digit)  30.3C  Wrist    *3.8 <3.6 14.9 >10 Wrist Palm 2.3 0.0    Palm    1.5 <2.0 17.5         Left Radial Anti Sensory (Base 1st Digit)  31.5C  Wrist    1.9 <3.1 17.7  Wrist Base 1st Digit 1.9 0.0    Left Ulnar Anti Sensory (5th Digit)  31.6C  Wrist    3.1 <3.7 *7.3 >15.0 Wrist 5th Digit 3.1 14.0 45 >38   Motor Summary Table   Stim Site NR Onset (ms) Norm Onset (ms) O-P Amp (mV) Norm O-P Amp Site1 Site2 Delta-0 (ms) Dist (cm) Vel (m/s) Norm Vel (m/s)  Left Median Motor (Abd Poll Brev)  31.6C  Wrist    3.9 <4.2 7.3 >5 Elbow Wrist 3.8 21.0 55 >50  Elbow    7.7  7.5         Left Ulnar Motor (Abd Dig Min)  31.9C  Wrist    2.9 <4.2 6.5 >3 B Elbow Wrist 3.0  18.5 62 >53  B Elbow    5.9  5.9  A Elbow B Elbow 1.6 10.0 63 >53  A Elbow    7.5  5.9          EMG   Side Muscle Nerve Root Ins Act Fibs Psw Amp Dur Poly Recrt Int Bruna Comment  Left Abd Poll Brev Median C8-T1 Nml Nml Nml Nml Nml 0 Nml Nml   Left 1stDorInt Ulnar C8-T1 Nml Nml Nml Nml Nml 0 Nml Nml   Left PronatorTeres Median C6-7 Nml Nml Nml Nml Nml 0 Nml Nml   Left Biceps Musculocut C5-6 Nml Nml Nml Nml Nml 0 Nml Nml     Nerve Conduction Studies Anti Sensory Left/Right Comparison   Stim Site L Lat (ms) R Lat (ms) L-R Lat (ms) L Amp (V) R Amp (V) L-R Amp (%) Site1 Site2 L Vel (m/s) R Vel (m/s) L-R Vel (m/s)  Median Acr Palm Anti Sensory (2nd Digit)  30.3C  Wrist *3.8   14.9   Wrist Palm     Palm 1.5   17.5         Radial Anti Sensory (Base 1st Digit)  31.5C  Wrist 1.9   17.7   Wrist Base 1st Digit  Ulnar Anti Sensory (5th Digit)  31.6C  Wrist 3.1   *7.3   Wrist 5th Digit 45     Motor Left/Right Comparison   Stim Site L Lat (ms) R Lat (ms) L-R Lat (ms) L Amp (mV) R Amp (mV) L-R Amp (%) Site1 Site2 L Vel (m/s) R Vel (m/s) L-R Vel (m/s)  Median Motor (Abd Poll Brev)  31.6C  Wrist 3.9   7.3   Elbow Wrist 55    Elbow 7.7   7.5         Ulnar Motor (Abd Dig Min)  31.9C  Wrist 2.9   6.5   B Elbow Wrist 62    B Elbow 5.9   5.9   A Elbow B Elbow 63    A Elbow 7.5   5.9            Waveforms:

## 2024-10-05 ENCOUNTER — Encounter: Admitting: Physical Medicine and Rehabilitation

## 2024-10-17 ENCOUNTER — Other Ambulatory Visit (HOSPITAL_COMMUNITY): Payer: Self-pay

## 2024-10-17 ENCOUNTER — Other Ambulatory Visit: Payer: Self-pay

## 2024-10-17 ENCOUNTER — Telehealth: Payer: Self-pay

## 2024-10-17 MED ORDER — PREDNISONE 10 MG PO TABS
10.0000 mg | ORAL_TABLET | Freq: Every day | ORAL | 1 refills | Status: AC
  Filled 2024-10-17: qty 30, 30d supply, fill #0

## 2024-10-17 NOTE — Telephone Encounter (Signed)
 Per Dr. Harden ok to refill Prednisone  rx. 10 mg #30 pt is aware.

## 2024-10-20 DIAGNOSIS — R269 Unspecified abnormalities of gait and mobility: Secondary | ICD-10-CM | POA: Diagnosis not present

## 2024-10-20 DIAGNOSIS — G4733 Obstructive sleep apnea (adult) (pediatric): Secondary | ICD-10-CM | POA: Diagnosis not present

## 2024-10-25 ENCOUNTER — Ambulatory Visit (INDEPENDENT_AMBULATORY_CARE_PROVIDER_SITE_OTHER): Admitting: Orthopedic Surgery

## 2024-10-25 ENCOUNTER — Ambulatory Visit: Admitting: Orthopedic Surgery

## 2024-10-25 ENCOUNTER — Other Ambulatory Visit: Payer: Self-pay | Admitting: Orthopedic Surgery

## 2024-10-25 ENCOUNTER — Other Ambulatory Visit (HOSPITAL_COMMUNITY): Payer: Self-pay

## 2024-10-25 DIAGNOSIS — G5602 Carpal tunnel syndrome, left upper limb: Secondary | ICD-10-CM | POA: Diagnosis not present

## 2024-10-25 MED ORDER — ACETAMINOPHEN-CODEINE 300-30 MG PO TABS
1.0000 | ORAL_TABLET | Freq: Four times a day (QID) | ORAL | 0 refills | Status: AC | PRN
  Filled 2024-10-25: qty 20, 5d supply, fill #0

## 2024-10-25 NOTE — Progress Notes (Signed)
 "  Procedure Note  Patient: Denise Austin             Date of Birth: [birth year]           MRN: [id number]             Visit Date: 10/25/2024  Procedures: Visit Diagnoses:  1. Carpal tunnel syndrome, left upper limb     NAME: JAMIRA BARFUSS MEDICAL RECORD NO: [id number] DATE OF BIRTH: 09-10-1971 [facility]AC[facility] LOCATION: [facility] PHYSICIAN: GILDARDO ALDERTON, MD   OPERATIVE REPORT   DATE OF PROCEDURE: 10/25/2024    PREOPERATIVE DIAGNOSIS: Left carpal tunnel syndrome   POSTOPERATIVE DIAGNOSIS: Left carpal tunnel syndrome   PROCEDURE: Left open carpal tunnel release   SURGEON:  Gildardo Alderton, M.D.   ASSISTANT: Joesph Dinsmore, OPA   ANESTHESIA:  Local   INTRAVENOUS FLUIDS:  Per anesthesia flow sheet.   ESTIMATED BLOOD LOSS:  Minimal.   COMPLICATIONS:  None.   SPECIMENS:  none   TOURNIQUET TIME: 3 minutes  DISPOSITION:  Stable to PACU.   INDICATIONS: 53 year old female with clinical and electrodiagnostic evidence of left-sided carpal tunnel syndrome that was refractory to conservative care.  Patient was indicated for left open carpal tunnel release.  Risks and benefits of surgery were discussed including the risks of infection, bleeding, scarring, stiffness, nerve injury, vascular injury, tendon injury, need for subsequent operation, persistent symptoms, recurrence.  She voiced understanding of these risks and elected to proceed.  OPERATIVE COURSE: Patient was seen and identified in the preprocedure area and marked appropriately.  Surgical consent had been signed.  Patient was transferred to the procedure room and placed in supine position with the left upper extremity on an arm board.  10 cc of 1% lidocaine  with epinephrine was utilized around the planned incisional site.  Left upper extremity was prepped and draped in normal sterile orthopedic fashion.  A surgical pause was performed between the surgeon and staff, all were in agreement as to the  patient, procedure, and site of procedure.  Tourniquet was placed and padded appropriately to the left upper arm.  The arm was exsanguinated and the tourniquet was inflated to 250 mmHg.  Longitudinal incision was designed in the thenar crease in line with the radial border of the ring finger, from intersection of Kaplan's cardinal line down to level of the distal wrist crease. Incision was carried down utilizing 15 blade. Blunt dissection was performed, palmar fascia was identified and incised sharply utilizing a Beaver blade. Careful dissection was performed down, thenar musculature was bluntly elevated and the transcarpal ligament was identified.  A Beaver blade was then utilized to divide the transcarpal ligament in a distal to proximal fashion.  Fat surrounding the palmar arch was encountered to confirm appropriate distal release.  At the level of the wrist crease, skin flaps were elevated to allow for release of the proximal portion of transverse carpal ligament as well as the antebrachial fascia into the forearm. Appropriate decompression was noted of the median nerve, care was taken to protect the nerve in its entirety throughout. Once we were satisfied with our proximal and distal release, tourniquet was deflated and bipolar electrocautery was utilized for hemostasis.  Tourniquet time was 3 minutes.  Copious irrigation was performed followed by closure utilizing 4-0 nylon in standard fashion for the skin surface. Sterile dressings were applied followed by a loosefitting soft hand dressing. Patient was subsequently taken to the recovery area in stable condition.  Post-operative plan: The patient will recover  and then be discharged home.  The patient will be non weight bearing on the left upper extremity in a soft dressing.   I will see the patient back in the office in 2 weeks for postoperative followup.  Discharge instructions were provided for appropriate wound care, dressing maintenance and pain  control.   Envi Eagleson, MD Electronically signed, 10/25/2024     "

## 2024-11-07 NOTE — Progress Notes (Unsigned)
" ° °  Denise Austin - 54 y.o. female MRN [id number]  Date of birth: August 21, 1971  Office Visit Note: Visit Date: 11/08/2024 PCP: Daryl Setter, NP Referred by: Daryl Setter, NP  Subjective:  HPI: Denise Austin is a 54 y.o. female who presents today for follow up 2 weeks status post left wrist open carpal tunnel release. She is overall doing well, pain is controlled.   Pertinent ROS were reviewed with the patient and found to be negative unless otherwise specified above in HPI.   Assessment & Plan: Visit Diagnoses:  1. S/P carpal tunnel release     Plan: Sutures removed in office today. She will be referred to our occupational therapist for gentle ROM and strengthening. She will follow up with us  in office in 4 weeks for clinical recheck.  Follow-up: No follow-ups on file.   Meds & Orders: No orders of the defined types were placed in this encounter.  No orders of the defined types were placed in this encounter.    Procedures: No procedures performed       Objective:   Vital Signs: LMP 04/02/2018   Ortho Exam left hand: - Well-healing palmar incision, sutures removed, skin edges well-approximated without erythema or drainage - Composite fist without restriction - 5/5 APB minimal thenar atrophy   Imaging: No results found.   Joesph Dinsmore, OPA-C  "

## 2024-11-08 ENCOUNTER — Ambulatory Visit: Admitting: Orthopedic Surgery

## 2024-11-08 DIAGNOSIS — Z9889 Other specified postprocedural states: Secondary | ICD-10-CM

## 2024-11-08 NOTE — Therapy (Signed)
 " OUTPATIENT OCCUPATIONAL THERAPY ORTHO EVALUATION AND DISCHARGE NOTE  Patient Name: Denise Austin MRN: [id number] DOB:Jun 03, 1971, 54 y.o., female Today's Date: 11/09/2024  REFERRING PROVIDER: Arlinda Buster, MD   END OF SESSION:   Past Medical History:  Diagnosis Date   Allergy    Arthritis    GERD (gastroesophageal reflux disease)    Hyperglycemia    Hypertension    OSA (obstructive sleep apnea) 04/15/2018   PONV (postoperative nausea and vomiting)    Pre-diabetes    Sleep apnea    wears cpap    Uterine fibroid    Past Surgical History:  Procedure Laterality Date   BUNIONECTOMY     Lt-01/07/06, Rt-09/15/07   CHOLECYSTECTOMY     laparoscopic   CYST REMOVAL HAND     Left Wrist   HERNIA REPAIR  1976   umbilical   LUMBAR LAMINECTOMY/DECOMPRESSION MICRODISCECTOMY Right 04/22/2017   Procedure: Extraforaminal Microdiscectomy - Lumbar two-Lumbar three - right;  Surgeon: Joshua Alm RAMAN, MD;  Location: [facility] OR;  Service: Neurosurgery;  Laterality: Right;   lumbar surgery revision  04/24/2017   TOTAL HIP ARTHROPLASTY Right 06/03/2024   Procedure: ARTHROPLASTY, HIP, TOTAL, ANTERIOR APPROACH;  Surgeon: Vernetta Lonni GRADE, MD;  Location: [facility] ORS;  Service: Orthopedics;  Laterality: Right;   WOUND EXPLORATION N/A 05/22/2017   Procedure: Revision Of Lumbar Wound;  Surgeon: Joshua Alm RAMAN, MD;  Location: [facility] OR;  Service: Neurosurgery;  Laterality: N/A;  Lumbar wound revision   Patient Active Problem List   Diagnosis Date Noted   Status post total replacement of right hip 06/03/2024   Migraine without status migrainosus, not intractable 05/06/2023   Symptomatic mammary hypertrophy 12/02/2021   Neck pain 12/02/2021   Morbid obesity due to excess calories (HCC) 04/21/2019   OSA (obstructive sleep apnea) 04/15/2018   Herpes simplex viral infection 07/10/2017   S/P lumbar laminectomy 04/22/2017   GERD (gastroesophageal reflux disease) 07/25/2016   Prediabetes 07/25/2016   HTN  (hypertension) 12/24/2015   Preventative health care 10/16/2014   CMC arthritis 07/06/2014   S/P excision of ganglion cyst 07/06/2014   Synovitis 07/06/2014     ONSET DATE:  DOS 10/25/24  REFERRING DIAG: S01.109 (ICD-10-CM) - S/P carpal tunnel release   THERAPY DIAG:     Localized edema  Other lack of coordination  Muscle weakness (generalized)  Stiffness of left hand, not elsewhere classified  Rationale for Evaluation and Treatment: Rehabilitation  SUBJECTIVE:   SUBJECTIVE STATEMENT: The patient states hx of paresthesia and pain in their hand and subsequent surgical release of the carpal tunnel. Now the patient states having some lingering paresthesia, stiffness, pain, decreased ability to make a fist and perform I/ADLs.     PERTINENT HISTORY: The patient is now approx 2 weeks s/p Lt hand CTR.   PRECAUTIONS: None relative to this evaluation and episode of care.   RED FLAGS: None   WEIGHT BEARING RESTRICTIONS: Yes: caution with weightbearing for the next 4-6 weeks, recommended less than 5lbs for next 2 weeks with affected hand  PAIN:  Are you having pain? Yes: NPRS scale: 4-5/10 Pain location:  sx area Pain description: aching and sore Aggravating factors: gripping/squeezing Relieving factors: rest  FALLS: Has patient fallen in last 6 months? No, not a fall risk  PLOF: Independent with I/ADLs  PATIENT GOALS: To improve motion, function with affected surgical hand  NEXT MD VISIT: PRN    OBJECTIVE MEASURES:   ADLs: Overall ADLs: States decreased ability to grab, hold household objects, pain and  difficulty to open containers, perform FMS tasks (manipulate fasteners on clothing).     UPPER EXTREMITY ROM:     A/ROM Right eval Left eval  Wrist flexion  ~59  Wrist extension  ~50  (Blank rows = not tested)                    Hand A/ROM Left eval  Full Fist Ability (or Gap to Distal Palmar Crease) Loose full fist, ~0.5 cm gap to [facility]  Thumb Opposition   (Kapandji Scale)  6/10  (Blank rows = not tested)   HAND STRENGTH & FUNCTION: Eval: Observed weakness in affected hand/arm, grossly 3-/5 MMT, but specific gripping and resistance training contraindicated today. Also at least mild observed coordination impairments with affected hand/arm due to stiffness and soreness. These deficits are expected to improve with HEP and recommendations.    COORDINATION: Eval: Mild observed coordination impairments with surgical hand, as seen by pain,stiffness, etc. Expected to improve with HEP and recommendations.   SENSATION: Eval:  Light touch mildly diminished especially through sx area. Expected to improve with HEP and recommendations.   EDEMA:   Eval:  Mildly swollen in surgical hand today.  Expected to improve with HEP and recommendations.   COGNITION: Eval: Overall cognitive status: WFL for evaluation today   OBSERVATIONS:   Eval: Surgical site is clean and no overt signs of infection, no drainage, signs of dehiscence, etc.  Tenderness and swelling is within normal limits for post-op timeframe.     TODAY'S TREATMENT:  Post-evaluation treatment:   The patient was given safety information for managing post-op wound, including not to soak wound, to keep clean and dry, to start with gentle light touch for desensitization and eventually more aggressive scar mobilizations approx 5-7 days after stitches are removed and if the wound is closed. The patient should monitor for signs of infection.  The patient was supplied with compressive gauze to help with swelling as needed.  The patient should contact the surgeon with any concerns immediately.   The patient should also avoid any strong gripping, push, pull, weight bearing or repetitive motion for the next 4-6 weeks.  The patient should not be doing painful activities.  The patient should not rest on their palm, keep the wrist bent for long time periods, or sleep on their hand. After a month, the patient  can progressively return to all light, normal activities. Sports and heavy weight lifting should be withheld for a total of 3 months.   The patient was also educated (explanation and demonstration) on the following home exercise program including tolerable range of motion, gentle passive range of motion, scar care, progressive desensitization, prevention of soft tissue contractures, etc. The patient states understanding all directions and feels comfortable with doing this at home, self-management, and following up with the surgeon as needed/scheduled.     CTR Exercises  - Turn [employer] Facing Up & Down  - 4 x daily - 15 reps - Bend and Pull Back Wrist SLOWLY  - 4 x daily - 15 reps - Tendon Glides  - 4 x daily - 5 reps - 3 second hold - Median Nerve Flossing  - 3 x daily - 5 reps - Wrist Prayer Stretch  - 4 x daily - 3 reps - 15 second hold - Full finger stretches   - 4 x daily - 3 reps - 15 second hold Patient Education - Scar Massage   PATIENT EDUCATION: Education details: See tx section above for  details  Person educated: Patient Education method: Verbal Instruction, Teach back, Handouts  Education comprehension: States and demonstrates understanding   HOME EXERCISE PROGRAM: See tx section above for details    GOALS: Goals reviewed with patient? Yes   SHORT TERM GOALS: (STG required if POC>30 days) Target Date: 11/09/24  1.  Pt will demo/state understanding of initial HEP and therapist recommendations to improve pain levels, improve motions and ability and eventually return to normal activities.   Goal status: MET    ASSESSMENT:  CLINICAL IMPRESSION: Patient is a 55 y.o. female who was seen today for occupational therapy evaluation for swelling, pain, weakness and decreased functional ability following carpal tunnel release procedure. The patient is appropriate for OT rehab services and benefited from treatment today. The patient got copious education/treatment today for  self-care, wound management, exercises and how to transition to normal activities in the next 4-6 weeks. The patient agrees that they can manage these recommendations independently, and should not need to return for follow up visits. The patient should follow up with the surgeon with any concerns, and could possibly return to therapy, if needed, with a new order.  The patient will discharge therapy treatment after this visit.     PERFORMANCE DEFICITS: in functional skills including ADLs, IADLs, coordination, dexterity, sensation, edema, ROM, strength, pain, fascial restrictions, flexibility, Fine motor control, body mechanics, endurance, decreased knowledge of precautions, wound, and UE functional use, cognitive skills including problem solving and safety awareness, and psychosocial skills including coping strategies, environmental adaptation, and habits.   IMPAIRMENTS: are limiting patient from ADLs, IADLs, rest and sleep, leisure, and social participation.   COMORBIDITIES: may have co-morbidities  that affects occupational performance. Patient will benefit from skilled OT to address above impairments and improve overall function.  MODIFICATION OR ASSISTANCE TO COMPLETE EVALUATION: No modification of tasks or assist necessary to complete an evaluation.  OT OCCUPATIONAL PROFILE AND HISTORY: Problem focused assessment: Including review of records relating to presenting problem.  CLINICAL DECISION MAKING: LOW - limited treatment options, no task modification necessary  REHAB POTENTIAL: Excellent  EVALUATION COMPLEXITY: Low      PLAN:  OT FREQUENCY: one time visit  OT DURATION: 1 sessions  PLANNED INTERVENTIONS: self care/ADL training, therapeutic exercise, therapeutic activity, neuromuscular re-education, manual therapy, scar mobilization, passive range of motion, splinting, ultrasound, fluidotherapy, compression bandaging, moist heat, cryotherapy, contrast bath, patient/family education,  energy conservation, coping strategies training, and Re-evaluation  RECOMMENDED OTHER SERVICES: none now   CONSULTED AND AGREED WITH PLAN OF CARE: Patient  PLAN FOR NEXT SESSION:   N/A    Melvenia Ada, OTR/L, CHT 11/09/2024, 8:33 AM  "

## 2024-11-08 NOTE — Addendum Note (Signed)
 Addended by: EVERET JOESPH BROCKS on: 11/08/2024 08:34 AM   Modules accepted: Orders

## 2024-11-09 ENCOUNTER — Encounter: Payer: Self-pay | Admitting: Rehabilitative and Restorative Service Providers"

## 2024-11-09 ENCOUNTER — Ambulatory Visit: Admitting: Rehabilitative and Restorative Service Providers"

## 2024-11-09 DIAGNOSIS — R600 Localized edema: Secondary | ICD-10-CM

## 2024-11-09 DIAGNOSIS — R278 Other lack of coordination: Secondary | ICD-10-CM | POA: Diagnosis not present

## 2024-11-09 DIAGNOSIS — M25642 Stiffness of left hand, not elsewhere classified: Secondary | ICD-10-CM

## 2024-11-09 DIAGNOSIS — M6281 Muscle weakness (generalized): Secondary | ICD-10-CM

## 2024-11-15 ENCOUNTER — Ambulatory Visit: Admitting: Family

## 2024-11-24 ENCOUNTER — Other Ambulatory Visit: Payer: Self-pay | Admitting: Family

## 2024-11-24 DIAGNOSIS — Z1231 Encounter for screening mammogram for malignant neoplasm of breast: Secondary | ICD-10-CM

## 2024-12-01 ENCOUNTER — Other Ambulatory Visit: Payer: Self-pay | Admitting: Family

## 2024-12-01 ENCOUNTER — Other Ambulatory Visit: Payer: Self-pay

## 2024-12-01 ENCOUNTER — Other Ambulatory Visit (HOSPITAL_COMMUNITY): Payer: Self-pay

## 2024-12-01 ENCOUNTER — Encounter (HOSPITAL_COMMUNITY): Payer: Self-pay

## 2024-12-01 MED ORDER — METOPROLOL SUCCINATE ER 100 MG PO TB24
100.0000 mg | ORAL_TABLET | Freq: Every day | ORAL | 0 refills | Status: AC
  Filled 2024-12-01: qty 90, 90d supply, fill #0

## 2024-12-06 ENCOUNTER — Encounter: Admitting: Orthopedic Surgery

## 2024-12-06 ENCOUNTER — Ambulatory Visit: Admitting: Family

## 2024-12-09 ENCOUNTER — Ambulatory Visit

## 2025-01-12 ENCOUNTER — Ambulatory Visit: Admitting: Orthopaedic Surgery

## 2025-01-16 ENCOUNTER — Ambulatory Visit: Admitting: Orthopaedic Surgery
